# Patient Record
Sex: Male | Born: 1935 | Race: White | Hispanic: No | Marital: Married | Smoking: Never smoker
Health system: Southern US, Community
[De-identification: ages and names within clinical notes are randomized; demographics above are authoritative.]

## PROBLEM LIST (undated history)

## (undated) DIAGNOSIS — E0500 Thyrotoxicosis with diffuse goiter without thyrotoxic crisis or storm: Secondary | ICD-10-CM

## (undated) DIAGNOSIS — C61 Malignant neoplasm of prostate: Secondary | ICD-10-CM

## (undated) DIAGNOSIS — I10 Essential (primary) hypertension: Secondary | ICD-10-CM

## (undated) DIAGNOSIS — C801 Malignant (primary) neoplasm, unspecified: Secondary | ICD-10-CM

## (undated) DIAGNOSIS — I4891 Unspecified atrial fibrillation: Secondary | ICD-10-CM

## (undated) DIAGNOSIS — I495 Sick sinus syndrome: Secondary | ICD-10-CM

## (undated) DIAGNOSIS — E785 Hyperlipidemia, unspecified: Secondary | ICD-10-CM

## (undated) DIAGNOSIS — N289 Disorder of kidney and ureter, unspecified: Secondary | ICD-10-CM

## (undated) HISTORY — DX: Malignant neoplasm of prostate: C61

## (undated) HISTORY — DX: Disorder of kidney and ureter, unspecified: N28.9

## (undated) HISTORY — DX: Essential (primary) hypertension: I10

## (undated) HISTORY — PX: PACEMAKER INSERTION: SHX728

## (undated) HISTORY — DX: Thyrotoxicosis with diffuse goiter without thyrotoxic crisis or storm: E05.00

## (undated) HISTORY — DX: Hyperlipidemia, unspecified: E78.5

## (undated) HISTORY — DX: Unspecified atrial fibrillation: I48.91

## (undated) HISTORY — DX: Sick sinus syndrome: I49.5

## (undated) HISTORY — DX: Malignant (primary) neoplasm, unspecified: C80.1

---
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
## 2004-05-05 LAB — HM COLONOSCOPY: HM Colonoscopy: NORMAL

## 2004-06-26 ENCOUNTER — Encounter: Payer: Self-pay | Admitting: Internal Medicine

## 2004-11-25 ENCOUNTER — Encounter: Payer: Self-pay | Admitting: Internal Medicine

## 2005-05-05 HISTORY — PX: OTHER SURGICAL HISTORY: SHX169

## 2005-05-19 ENCOUNTER — Encounter: Payer: Self-pay | Admitting: Internal Medicine

## 2007-01-06 ENCOUNTER — Ambulatory Visit: Payer: Self-pay | Admitting: Internal Medicine

## 2007-01-06 DIAGNOSIS — I10 Essential (primary) hypertension: Secondary | ICD-10-CM | POA: Insufficient documentation

## 2007-01-06 DIAGNOSIS — E0500 Thyrotoxicosis with diffuse goiter without thyrotoxic crisis or storm: Secondary | ICD-10-CM

## 2007-01-06 DIAGNOSIS — J309 Allergic rhinitis, unspecified: Secondary | ICD-10-CM | POA: Insufficient documentation

## 2007-01-06 DIAGNOSIS — E785 Hyperlipidemia, unspecified: Secondary | ICD-10-CM | POA: Insufficient documentation

## 2007-01-08 LAB — CONVERTED CEMR LAB
ALT: 27 units/L (ref 0–53)
Albumin: 4 g/dL (ref 3.5–5.2)
BUN: 20 mg/dL (ref 6–23)
Basophils Relative: 0.4 % (ref 0.0–1.0)
CO2: 32 meq/L (ref 19–32)
Calcium: 9.5 mg/dL (ref 8.4–10.5)
Chloride: 104 meq/L (ref 96–112)
Creatinine, Ser: 1.5 mg/dL (ref 0.4–1.5)
Eosinophils Relative: 1.4 % (ref 0.0–5.0)
Free T4: 0.9 ng/dL (ref 0.6–1.6)
GFR calc non Af Amer: 49 mL/min
LDL Cholesterol: 98 mg/dL (ref 0–99)
Lymphocytes Relative: 21.3 % (ref 12.0–46.0)
Platelets: 218 10*3/uL (ref 150–400)
RBC: 4.08 M/uL — ABNORMAL LOW (ref 4.22–5.81)
RDW: 12 % (ref 11.5–14.6)
TSH: 2.15 microintl units/mL (ref 0.35–5.50)
Total CHOL/HDL Ratio: 4.8
Triglycerides: 156 mg/dL — ABNORMAL HIGH (ref 0–149)
VLDL: 31 mg/dL (ref 0–40)
WBC: 5.4 10*3/uL (ref 4.5–10.5)

## 2007-01-12 ENCOUNTER — Ambulatory Visit: Payer: Self-pay | Admitting: Internal Medicine

## 2007-01-14 ENCOUNTER — Encounter: Payer: Self-pay | Admitting: Internal Medicine

## 2007-01-22 ENCOUNTER — Ambulatory Visit: Payer: Self-pay

## 2007-02-10 ENCOUNTER — Encounter: Payer: Self-pay | Admitting: Internal Medicine

## 2007-06-09 ENCOUNTER — Telehealth (INDEPENDENT_AMBULATORY_CARE_PROVIDER_SITE_OTHER): Payer: Self-pay | Admitting: *Deleted

## 2007-06-17 ENCOUNTER — Ambulatory Visit: Payer: Self-pay | Admitting: Internal Medicine

## 2007-07-14 ENCOUNTER — Ambulatory Visit: Payer: Self-pay | Admitting: Internal Medicine

## 2007-07-14 LAB — CONVERTED CEMR LAB
ALT: 24 units/L (ref 0–53)
AST: 22 units/L (ref 0–37)
BUN: 17 mg/dL (ref 6–23)
Basophils Absolute: 0 10*3/uL (ref 0.0–0.1)
Bilirubin, Direct: 0.1 mg/dL (ref 0.0–0.3)
Cholesterol: 134 mg/dL (ref 0–200)
Creatinine, Ser: 1.6 mg/dL — ABNORMAL HIGH (ref 0.4–1.5)
Eosinophils Relative: 1.4 % (ref 0.0–5.0)
Free T4: 0.8 ng/dL (ref 0.6–1.6)
GFR calc Af Amer: 55 mL/min
GFR calc non Af Amer: 46 mL/min
HCT: 39.2 % (ref 39.0–52.0)
Hemoglobin: 13.4 g/dL (ref 13.0–17.0)
LDL Cholesterol: 77 mg/dL (ref 0–99)
MCHC: 34.1 g/dL (ref 30.0–36.0)
MCV: 96.7 fL (ref 78.0–100.0)
Monocytes Absolute: 0.7 10*3/uL (ref 0.2–0.7)
Neutrophils Relative %: 60.6 % (ref 43.0–77.0)
Phosphorus: 3.2 mg/dL (ref 2.3–4.6)
Potassium: 4.3 meq/L (ref 3.5–5.1)
RBC: 4.06 M/uL — ABNORMAL LOW (ref 4.22–5.81)
RDW: 12 % (ref 11.5–14.6)
Sodium: 137 meq/L (ref 135–145)
Total Protein: 7.1 g/dL (ref 6.0–8.3)
WBC: 5.2 10*3/uL (ref 4.5–10.5)

## 2007-09-06 ENCOUNTER — Ambulatory Visit: Payer: Self-pay

## 2008-01-11 ENCOUNTER — Ambulatory Visit: Payer: Self-pay | Admitting: Internal Medicine

## 2008-01-11 DIAGNOSIS — Z8546 Personal history of malignant neoplasm of prostate: Secondary | ICD-10-CM | POA: Insufficient documentation

## 2008-01-12 LAB — CONVERTED CEMR LAB
Alkaline Phosphatase: 76 units/L (ref 39–117)
BUN: 28 mg/dL — ABNORMAL HIGH (ref 6–23)
Bilirubin, Direct: 0.1 mg/dL (ref 0.0–0.3)
CO2: 28 meq/L (ref 19–32)
Chloride: 103 meq/L (ref 96–112)
Creatinine, Ser: 1.7 mg/dL — ABNORMAL HIGH (ref 0.4–1.5)
GFR calc Af Amer: 51 mL/min
Glucose, Bld: 95 mg/dL (ref 70–99)
LDL Cholesterol: 85 mg/dL (ref 0–99)
Potassium: 4.8 meq/L (ref 3.5–5.1)
Total Bilirubin: 0.9 mg/dL (ref 0.3–1.2)
Total CHOL/HDL Ratio: 4.5
Total Protein: 7 g/dL (ref 6.0–8.3)
VLDL: 20 mg/dL (ref 0–40)

## 2008-04-17 ENCOUNTER — Ambulatory Visit: Payer: Self-pay | Admitting: Internal Medicine

## 2008-07-17 ENCOUNTER — Ambulatory Visit: Payer: Self-pay | Admitting: Internal Medicine

## 2008-07-19 ENCOUNTER — Ambulatory Visit: Payer: Self-pay | Admitting: Internal Medicine

## 2008-07-19 DIAGNOSIS — N1832 Chronic kidney disease, stage 3b: Secondary | ICD-10-CM | POA: Insufficient documentation

## 2008-07-19 DIAGNOSIS — N183 Chronic kidney disease, stage 3 (moderate): Secondary | ICD-10-CM

## 2008-07-20 LAB — CONVERTED CEMR LAB
AST: 23 units/L (ref 0–37)
Albumin: 4.2 g/dL (ref 3.5–5.2)
Alkaline Phosphatase: 87 units/L (ref 39–117)
Basophils Relative: 0.6 % (ref 0.0–3.0)
Cholesterol: 137 mg/dL (ref 0–200)
Creatinine, Ser: 1.7 mg/dL — ABNORMAL HIGH (ref 0.4–1.5)
Eosinophils Absolute: 0.1 10*3/uL (ref 0.0–0.7)
HDL: 34.4 mg/dL — ABNORMAL LOW (ref >39.00)
Hemoglobin: 13.7 g/dL (ref 13.0–17.0)
LDL Cholesterol: 83 mg/dL (ref 0–99)
MCHC: 34.4 g/dL (ref 30.0–36.0)
MCV: 96.7 fL (ref 78.0–100.0)
Monocytes Absolute: 0.8 10*3/uL (ref 0.1–1.0)
Neutro Abs: 3.6 10*3/uL (ref 1.4–7.7)
Potassium: 5.1 meq/L (ref 3.5–5.1)
RBC: 4.11 M/uL — ABNORMAL LOW (ref 4.22–5.81)
RDW: 12 % (ref 11.5–14.6)
Total Protein: 7.2 g/dL (ref 6.0–8.3)
Triglycerides: 100 mg/dL (ref 0.0–149.0)

## 2008-08-25 ENCOUNTER — Encounter (INDEPENDENT_AMBULATORY_CARE_PROVIDER_SITE_OTHER): Payer: Self-pay | Admitting: Radiology

## 2009-01-17 ENCOUNTER — Ambulatory Visit: Payer: Self-pay | Admitting: Internal Medicine

## 2009-01-19 LAB — CONVERTED CEMR LAB
Albumin: 4.3 g/dL (ref 3.5–5.2)
Basophils Relative: 0 % (ref 0.0–3.0)
CO2: 30 meq/L (ref 19–32)
Calcium: 9.3 mg/dL (ref 8.4–10.5)
Eosinophils Relative: 1.3 % (ref 0.0–5.0)
Glucose, Bld: 96 mg/dL (ref 70–99)
HCT: 41.7 % (ref 39.0–52.0)
HDL: 31.7 mg/dL — ABNORMAL LOW (ref >39.00)
Hemoglobin: 14.2 g/dL (ref 13.0–17.0)
LDL Cholesterol: 81 mg/dL (ref 0–99)
Lymphs Abs: 1.4 10*3/uL (ref 0.7–4.0)
Monocytes Relative: 13.6 % — ABNORMAL HIGH (ref 3.0–12.0)
Neutro Abs: 3.5 10*3/uL (ref 1.4–7.7)
Platelets: 205 10*3/uL (ref 150.0–400.0)
RBC: 4.25 M/uL (ref 4.22–5.81)
Sodium: 141 meq/L (ref 135–145)
Total CHOL/HDL Ratio: 4
Triglycerides: 116 mg/dL (ref 0.0–149.0)
VLDL: 23.2 mg/dL (ref 0.0–40.0)
WBC: 5.8 10*3/uL (ref 4.5–10.5)

## 2009-02-21 DIAGNOSIS — R55 Syncope and collapse: Secondary | ICD-10-CM | POA: Insufficient documentation

## 2009-04-04 ENCOUNTER — Encounter (INDEPENDENT_AMBULATORY_CARE_PROVIDER_SITE_OTHER): Payer: Self-pay | Admitting: *Deleted

## 2009-05-07 ENCOUNTER — Ambulatory Visit: Payer: Self-pay | Admitting: Internal Medicine

## 2009-07-18 ENCOUNTER — Ambulatory Visit: Payer: Self-pay | Admitting: Internal Medicine

## 2009-07-20 LAB — CONVERTED CEMR LAB
Albumin: 4 g/dL (ref 3.5–5.2)
Calcium: 9.3 mg/dL (ref 8.4–10.5)
Glucose, Bld: 98 mg/dL (ref 70–99)
Potassium: 4.9 meq/L (ref 3.5–5.1)

## 2010-01-15 ENCOUNTER — Ambulatory Visit: Payer: Self-pay | Admitting: Internal Medicine

## 2010-01-16 LAB — CONVERTED CEMR LAB
Albumin: 4.3 g/dL (ref 3.5–5.2)
Basophils Relative: 0.5 % (ref 0.0–3.0)
CO2: 31 meq/L (ref 19–32)
Chloride: 100 meq/L (ref 96–112)
Eosinophils Relative: 0.8 % (ref 0.0–5.0)
Free T4: 0.93 ng/dL (ref 0.60–1.60)
HCT: 39.8 % (ref 39.0–52.0)
HDL: 35.3 mg/dL — ABNORMAL LOW (ref >39.00)
LDL Cholesterol: 104 mg/dL — ABNORMAL HIGH (ref 0–99)
Lymphs Abs: 1.3 10*3/uL (ref 0.7–4.0)
MCV: 98.3 fL (ref 78.0–100.0)
Monocytes Absolute: 0.9 10*3/uL (ref 0.1–1.0)
Potassium: 4.9 meq/L (ref 3.5–5.1)
RBC: 4.04 M/uL — ABNORMAL LOW (ref 4.22–5.81)
Sodium: 140 meq/L (ref 135–145)
Total CHOL/HDL Ratio: 4
Triglycerides: 83 mg/dL (ref 0.0–149.0)
WBC: 6.7 10*3/uL (ref 4.5–10.5)

## 2010-05-05 HISTORY — PX: CATARACT EXTRACTION W/ INTRAOCULAR LENS  IMPLANT, BILATERAL: SHX1307

## 2010-05-13 ENCOUNTER — Telehealth (INDEPENDENT_AMBULATORY_CARE_PROVIDER_SITE_OTHER): Payer: Self-pay | Admitting: *Deleted

## 2010-06-04 NOTE — Assessment & Plan Note (Signed)
Summary: 6 MONTH FOLLOW UPR/BH   Vital Signs:  Patient profile:   76 year old male Weight:      157 pounds Temp:     98.3 degrees F oral Pulse rate:   64 / minute Pulse rhythm:   regular BP sitting:   120 / 70  (left arm) Cuff size:   large  Vitals Entered By: Mervin Hack CMA Duncan Dull) (July 18, 2009 8:57 AM) CC: 6 month follow-up   History of Present Illness: Feels fine No new complaints  did have some pedal edema at last visit with Dr Graciela Husbands he hasn't noticed any swelling, unless he is travelling (with prolonged sitting) NO SOB  [facility] no [facility] in [facility] tolerance  No chest pain No palpitations  No voiding problems nocturia x 1 (not every night)  Allergies: No Known Drug Allergies  Past History:  Past medical, surgical, family and social histories (including risk factors) reviewed for relevance to current acute and chronic problems.  Past Medical History: RENAL INSUFFICIENCY (ICD-588.9) ADENOCARCINOMA, PROSTATE (ICD-185) GRAVE'S DISEASE (ICD-242.00) HYPERTENSION (ICD-401.9) HYPERLIPIDEMIA (ICD-272.4) ALLERGIC RHINITIS (ICD-477.9) SICK SINUS SYNDROME (ICD-427.81)-----------------------------------------------Dr Graciela Husbands  Past Surgical History: Reviewed history from 01/06/2007 and no changes required. Tonsils/adenoids  1947 Prostate cancer --RT  2007 Pacemaker--8/06  bradycardia/syncope  Family History: Reviewed history from 01/06/2007 and no changes required. Dad died in 28's  Blood clot during CABG Mom died in 41's     Some cancer, had DM 1 sister--good health CAD only in Dad DM just Mom No HTN No colon or prostate cancer  Social History: Reviewed history from 01/06/2007 and no changes required. 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 Wife to make health care decisions (then daughter Marjean Donna) Would accept resuscitation No  sure about feeding tube but no "unusual attempts"  Review of Systems       no arthritis pain appetite is fine weight fairly stable sleeps well  Physical Exam  General:  alert and normal appearance.   Neck:  supple, no masses, no thyromegaly, no carotid bruits, and no cervical lymphadenopathy.   Lungs:  normal respiratory effort and normal breath sounds.   Heart:  normal rate, regular rhythm, no murmur, and no gallop.   Abdomen:  soft, non-tender, no masses, no hepatomegaly, and no splenomegaly.   Pulses:  2+ in feet Extremities:  no edema today Psych:  normally interactive, good eye contact, not anxious appearing, and not depressed appearing.     Impression & Recommendations:  Problem # 1:  HYPERTENSION (ICD-401.9) Assessment Unchanged good control could change to losartan if insurance problems  His updated medication list for this problem includes:    Diovan Hct 160-12.5 Mg Tabs (Valsartan-hydrochlorothiazide) .Marland Kitchen... Take 1 tablet by mouth once a day    Bisoprolol Fumarate 5 Mg Tabs (Bisoprolol fumarate) .Marland Kitchen... Take 1/2  by mouth daily  BP today: 120/70 Prior BP: 120/68 (05/07/2009)  Prior 10 Yr Risk Heart Disease: 14 % (01/17/2009)  Labs Reviewed: K+: 5.1 (01/17/2009) Creat: : 1.5 (01/17/2009)   Chol: 136 (01/17/2009)   HDL: 31.70 (01/17/2009)   LDL: 81 (01/17/2009)   TG: 116.0 (01/17/2009)  Problem # 2:  HYPERLIPIDEMIA (ICD-272.4) Assessment: Unchanged at goal no problems with med labs next time  His updated medication list for this problem includes:    Lipitor 10 Mg Tabs (Atorvastatin calcium) .Marland Kitchen... Take 1 tablet by mouth once a day  Labs Reviewed: SGOT: 22 (01/17/2009)  SGPT: 22 (01/17/2009)  Prior 10 Yr Risk Heart Disease: 14 % (01/17/2009)   HDL:31.70 (01/17/2009), 34.40 (07/19/2008)  LDL:81 (01/17/2009), 83 (07/19/2008)  Chol:136 (01/17/2009), 137 (07/19/2008)  Trig:116.0 (01/17/2009), 100.0 (07/19/2008)  Problem # 3:  RENAL INSUFFICIENCY  (ICD-588.9) Assessment: Comment Only  last creat 1.5 has been stable will recheck  Orders: TLB-Renal Function Panel (80069-RENAL) Venipuncture (21308)  Problem # 4:  ADENOCARCINOMA, PROSTATE (ICD-185) Assessment: Comment Only  due for PSA  Orders: TLB-PSA (Prostate Specific Antigen) (84153-PSA)  Problem # 5:  SICK SINUS SYNDROME (ICD-427.81) Assessment: Comment Only pacer Dr Graciela Husbands follows  His updated medication list for this problem includes:    Bisoprolol Fumarate 5 Mg Tabs (Bisoprolol fumarate) .Marland Kitchen... Take 1/2  by mouth daily    Aspirin 81 Mg Tbec (Aspirin) .Marland Kitchen... Take one by mouth daily  Complete Medication List: 1)  Lipitor 10 Mg Tabs (Atorvastatin calcium) .... Take 1 tablet by mouth once a day 2)  Diovan Hct 160-12.5 Mg Tabs (Valsartan-hydrochlorothiazide) .... Take 1 tablet by mouth once a day 3)  Centrum Silver Tabs (Multiple vitamins-minerals) .... Take 1 tablet by mouth once a day 4)  Bisoprolol Fumarate 5 Mg Tabs (Bisoprolol fumarate) .... Take 1/2  by mouth daily 5)  Aspirin 81 Mg Tbec (Aspirin) .... Take one by mouth daily 6)  Loratadine 10 Mg Tabs (Loratadine) .... Take 1 by mouth once daily 7)  Vitamin D 1000 Unit Tabs (Cholecalciferol) .... Take 1 by mouth once daily  Patient Instructions: 1)  Please schedule a follow-up appointment in 6 months .  Prescriptions: BISOPROLOL FUMARATE 5 MG  TABS (BISOPROLOL FUMARATE) Take 1/2  by mouth daily  #45 x 3   Entered by:   Mervin Hack CMA (AAMA)   Authorized by:   Cindee Salt MD   Signed by:   Mervin Hack CMA (AAMA) on 07/18/2009   Method used:   Electronically to        [id number]       [address]       [place], [place]  [address]       Ph: [phone]       Fax: [phone]   RxID:   [id number] DIOVAN HCT 160-12.5 MG  TABS (VALSARTAN-HYDROCHLOROTHIAZIDE) Take 1 tablet by mouth once a day  #90 x 3   Entered by:   Mervin Hack CMA (AAMA)   Authorized by:    Cindee Salt MD   Signed by:   Mervin Hack CMA (AAMA) on 07/18/2009   Method used:   Electronically to        [id number] ([facility]       [address]       [place], [place]  [address]       Ph: [phone]       Fax: [phone]   RxID:   [id number] LIPITOR 10 MG  TABS (ATORVASTATIN CALCIUM) Take 1 tablet by mouth once a day  #90 x 3   Entered by:   Mervin Hack CMA (AAMA)   Authorized by:   Cindee Salt MD   Signed by:   Mervin Hack CMA (AAMA) on 07/18/2009   Method used:   Electronically to        [id number] ([facility]       [address]       [place], [place]  [address]       Ph: [phone]  Fax: [phone]   RxID:   [id number]   Current Allergies (reviewed today): No known allergies   Appended Document: Orders Update    Clinical Lists Changes  Orders: Added new Service order of Prescription Created Electronically [phone]) - Signed

## 2010-06-04 NOTE — Assessment & Plan Note (Signed)
Summary: F1Y/AMD   Visit Type:  Follow-up Primary Provider:  Cindee Salt MD  CC:  no complaints.  History of Present Illness: [name] is seen in followup for syncope and history of sinus node dysfunction for which he underwent Biotronik pacemaker implantation a number of years ago in [place].The patient denies SOB, chest pain, edema or palpitations   Current Problems (verified): 1)  Syncope and Collapse  (ICD-780.2) 2)  Renal Insufficiency  (ICD-588.9) 3)  Prostate Cancer, Hx of  (ICD-V10.46) 4)  Adenocarcinoma, Prostate  (ICD-185) 5)  Grave's Disease  (ICD-242.00) 6)  Hypertension  (ICD-401.9) 7)  Hyperlipidemia  (ICD-272.4) 8)  Allergic Rhinitis  (ICD-477.9) 9)  Sick Sinus Syndrome  (ICD-427.81)  Current Medications (verified): 1)  Lipitor 10 Mg  Tabs (Atorvastatin Calcium) .... Take 1 Tablet By Mouth Once A Day 2)  Diovan Hct 160-12.5 Mg  Tabs (Valsartan-Hydrochlorothiazide) .... Take 1 Tablet By Mouth Once A Day 3)  Centrum Silver   Tabs (Multiple Vitamins-Minerals) .... Take 1 Tablet By Mouth Once A Day 4)  Bisoprolol Fumarate 5 Mg  Tabs (Bisoprolol Fumarate) .... Take 1/2  By Mouth Daily 5)  Aspirin 81 Mg Tbec (Aspirin) .... Take One By Mouth Daily 6)  Loratadine 10 Mg Tabs (Loratadine) .... Take 1 By Mouth Once Daily 7)  Vitamin D 1000 Unit Tabs (Cholecalciferol) .... Take 1 By Mouth Once Daily  Allergies (verified): No Known Drug Allergies  Vital Signs:  Patient profile:   76 year old male Height:      64 inches Weight:      160.25 pounds BMI:     27.61 Pulse rate:   60 / minute Pulse rhythm:   regular BP sitting:   120 / 68  (left arm) Cuff size:   regular  Vitals Entered By: Mercer Pod (May 07, 2009 11:55 AM)  Physical Exam  General:  The patient was alert and oriented in no acute distress. HEENT Normal.  Neck veins were flat, carotids were brisk.  Lungs were clear.  Heart sounds were regular without murmurs or gallops.    Abdomen was soft with active bowel sounds. There is no clubbing cyanosis or edema. Skin Warm and dry    PPM Specifications Following MD:  Sherryl Manges, MD     PPM Vendor:  Biotronik     PPM Model Number:  Scarlette Shorts     [facility] Serial Number:  [id number] PPM DOI:  12/03/2004     PPM Implanting MD:  Sherryl Manges, MD  Lead 1    Location: RA     DOI: 12/03/2004     Model #: Victorio Palm     Serial #: [id number]     Status: active Lead 2    Location: RV     DOI: 12/03/2004     Model #: SELX ST     Serial #: [id number]     Status: active   Indications:  SICK SINUS SYNDROME   PPM Follow Up Remote Check?  No Battery Voltage:  2.77 V     Battery Est. Longevity:  3 years 8 months     Pacer Dependent:  No       PPM Device Measurements Atrium  Amplitude: 5.8 mV, Impedance: 510 ohms, Threshold: 0.8 V at 0.4 msec Right Ventricle  Amplitude: 17.9 mV, Impedance: 982 ohms, Threshold: 0.3 V at 0.4 msec Auto Capture ER/Polarity: bipolar  Episodes MS Episodes:  21     Percent Mode Switch:  0  Ventricular High Rate:  1     Atrial Pacing:  97     Ventricular Pacing:  3  Parameters Mode:  dddr     Lower Rate Limit:  60     Upper Rate Limit:  130 Paced AV Delay:  300      Sensed AV Delay:  300 Rate Response Parameters:  125 Tech Comments:  longest MS was 1.5 minutes and fastest was 192.  Derek Jack, Cala Bradford :)  Impression & Recommendations:  Problem # 1:  SYNCOPE AND COLLAPSE (ICD-780.2) No recurrences following pacemaker implantation His updated medication list for this problem includes:    Bisoprolol Fumarate 5 Mg Tabs (Bisoprolol fumarate) .Marland Kitchen... Take 1/2  by mouth daily    Aspirin 81 Mg Tbec (Aspirin) .Marland Kitchen... Take one by mouth daily  Problem # 2:  SICK SINUS SYNDROME (ICD-427.81) !00 % atrial pacing wtih modest chronotropic incompetence We have reprogrammed his rate response slope to augment activity.  There are also episodes of atrial fibrillation identified by egram the longest of which is 1 1/2  minutes.  We will continue to monigtor this closesly His updated medication list for this problem includes:    Bisoprolol Fumarate 5 Mg Tabs (Bisoprolol fumarate) .Marland Kitchen... Take 1/2  by mouth daily    Aspirin 81 Mg Tbec (Aspirin) .Marland Kitchen... Take one by mouth daily  Problem # 3:  PACEMAKER, PERMANENT  DDD BIOTRONIK (ICD-V45.01) Device was interrogated and reprogramed as noted.  He undertakes daily monitoring  Patient Instructions: 1)  Your physician recommends that you schedule a follow-up appointment in: 1 year 2)  Your physician recommends that you continue on your current medications as directed. Please refer to the Current Medication list given to you today.

## 2010-06-04 NOTE — Assessment & Plan Note (Signed)
Summary: ROA 6 MTHS CYD   Vital Signs:  Patient profile:   76 year old male Weight:      161 pounds Temp:     97.8 degrees F oral Pulse rate:   60 / minute Pulse rhythm:   regular BP sitting:   132 / 68  (left arm) Cuff size:   large  Vitals Entered By: Mervin Hack CMA Duncan Dull) (January 15, 2010 10:32 AM) CC: 6 month follow-up   History of Present Illness: Doing great just home from 76 day tour of [place]  Did notice edema with trip but resolved quickly seems to be back to normal now No chest pain  No SOB No palpitations  [facility]  No problems with chol med no myalgias no sig arthritic problems  voids okay Nocturia x 1 in general  Allergies: No Known Drug Allergies  Past History:  Past medical, surgical, family and social histories (including risk factors) reviewed for relevance to current acute and chronic problems.  Past Medical History: Reviewed history from 07/18/2009 and no changes required. RENAL INSUFFICIENCY (ICD-588.9) ADENOCARCINOMA, PROSTATE (ICD-185) GRAVE'S DISEASE (ICD-242.00) HYPERTENSION (ICD-401.9) HYPERLIPIDEMIA (ICD-272.4) ALLERGIC RHINITIS (ICD-477.9) SICK SINUS SYNDROME (ICD-427.81)-----------------------------------------------Dr Graciela Husbands  Past Surgical History: Reviewed history from 01/06/2007 and no changes required. Tonsils/adenoids  1947 Prostate cancer --RT  2007 Pacemaker--8/06  bradycardia/syncope  Family History: Reviewed history from 01/06/2007 and no changes required. Dad died in 69's  Blood clot during CABG Mom died in 63's     Some cancer, had DM 1 sister--good health CAD only in Dad DM just Mom No HTN No colon or prostate cancer  Social History: Reviewed history from 01/06/2007 and no changes required. 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 Wife to make health care decisions (then  daughter Marjean Donna) Would accept resuscitation No sure about feeding tube but no "unusual attempts"  Review of Systems       weight up 4# but just back from [place] trip Sleeps well   Physical Exam  General:  alert and normal appearance.   Neck:  supple, no masses, no thyromegaly, no carotid bruits, and no cervical lymphadenopathy.   Lungs:  normal respiratory effort, no intercostal retractions, no accessory muscle use, and normal breath sounds.   Heart:  normal rate, regular rhythm, no murmur, and no gallop.   Abdomen:  soft and non-tender.   Msk:  no joint tenderness and no joint swelling.   Pulses:  1+ in feet Extremities:  no edema or calf tenderness Psych:  normally interactive, good eye contact, not anxious appearing, and not depressed appearing.     Impression & Recommendations:  Problem # 1:  HYPERTENSION (ICD-401.9) Assessment Unchanged  good control no changes needed will recheck renal function  His updated medication list for this problem includes:    Diovan Hct 160-12.5 Mg Tabs (Valsartan-hydrochlorothiazide) .Marland Kitchen... Take 1 tablet by mouth once a day    Bisoprolol Fumarate 5 Mg Tabs (Bisoprolol fumarate) .Marland Kitchen... Take 1/2  by mouth daily  BP today: 132/68 Prior BP: 120/70 (07/18/2009)  Prior 10 Yr Risk Heart Disease: 14 % (01/17/2009)  Labs Reviewed: K+: 4.9 (07/18/2009) Creat: : 1.8 (07/18/2009)   Chol: 136 (01/17/2009)   HDL: 31.70 (01/17/2009)   LDL: 81 (01/17/2009)   TG: 116.0 (01/17/2009)  Orders: TLB-Renal Function Panel (80069-RENAL) TLB-CBC Platelet - w/Differential (85025-CBCD) Venipuncture (87564)  Problem # 2:  HYPERLIPIDEMIA (ICD-272.4) Assessment: Unchanged  no problems with  meds due for labs  His updated medication list for this problem includes:    Lipitor 10 Mg Tabs (Atorvastatin calcium) .Marland Kitchen... Take 1 tablet by mouth once a day  Labs Reviewed: SGOT: 22 (01/17/2009)   SGPT: 22 (01/17/2009)  Prior 10 Yr Risk Heart Disease: 14 %  (01/17/2009)   HDL:31.70 (01/17/2009), 34.40 (07/19/2008)  LDL:81 (01/17/2009), 83 (07/19/2008)  Chol:136 (01/17/2009), 137 (07/19/2008)  Trig:116.0 (01/17/2009), 100.0 (07/19/2008)  Orders: TLB-Lipid Panel (80061-LIPID) TLB-Hepatic/Liver Function Pnl (80076-HEPATIC)  Problem # 3:  RENAL INSUFFICIENCY (ICD-588.9) Assessment: Unchanged no symptoms will recheck last 1.8  Problem # 4:  SICK SINUS SYNDROME (ICD-427.81) Assessment: Unchanged pacer in  His updated medication list for this problem includes:    Bisoprolol Fumarate 5 Mg Tabs (Bisoprolol fumarate) .Marland Kitchen... Take 1/2  by mouth daily    Aspirin 81 Mg Tbec (Aspirin) .Marland Kitchen... Take one by mouth daily  Labs Reviewed: Na: 144 (07/18/2009)   K+: 4.9 (07/18/2009)   CL: 109 (07/18/2009)   HCO3: 31 (07/18/2009) Ca: 9.3 (07/18/2009)   TSH: 1.83 (07/19/2008)   HCO3: 31 (07/18/2009)  Problem # 5:  ADENOCARCINOMA, PROSTATE (ICD-185) Assessment: Unchanged  will continue PSA every 6 months for now  Orders: TLB-PSA (Prostate Specific Antigen) (84153-PSA)  Complete Medication List: 1)  Lipitor 10 Mg Tabs (Atorvastatin calcium) .... Take 1 tablet by mouth once a day 2)  Diovan Hct 160-12.5 Mg Tabs (Valsartan-hydrochlorothiazide) .... Take 1 tablet by mouth once a day 3)  Bisoprolol Fumarate 5 Mg Tabs (Bisoprolol fumarate) .... Take 1/2  by mouth daily 4)  Aspirin 81 Mg Tbec (Aspirin) .... Take one by mouth daily 5)  Loratadine 10 Mg Tabs (Loratadine) .... Take 1 by mouth once daily 6)  Vitamin D 1000 Unit Tabs (Cholecalciferol) .... Take 1 by mouth once daily 7)  Centrum Silver Tabs (Multiple vitamins-minerals) .... Take 1 tablet by mouth once a day  Other Orders: TLB-TSH (Thyroid Stimulating Hormone) (84443-TSH) TLB-T4 (Thyrox), Free [phone])  Patient Instructions: 1)  Please schedule a follow-up appointment in 6 months .   Current Allergies (reviewed today): No known allergies

## 2010-06-06 NOTE — Progress Notes (Signed)
Summary: Called pt  Phone Note Outgoing Call Call back at [facility] Phone [phone]   Call placed by: Harlon Flor,  May 13, 2010 9:42 AM Call placed to: Patient Summary of Call: LMOM TCB to reschedule BUMP appt from 2/2 Initial call taken by: Harlon Flor,  May 13, 2010 9:42 AM

## 2010-06-11 ENCOUNTER — Encounter (INDEPENDENT_AMBULATORY_CARE_PROVIDER_SITE_OTHER): Payer: Medicare Other | Admitting: Internal Medicine

## 2010-06-11 ENCOUNTER — Encounter: Payer: Self-pay | Admitting: Internal Medicine

## 2010-06-11 DIAGNOSIS — Z950 Presence of cardiac pacemaker: Secondary | ICD-10-CM

## 2010-06-11 DIAGNOSIS — I498 Other specified cardiac arrhythmias: Secondary | ICD-10-CM

## 2010-06-20 NOTE — Assessment & Plan Note (Signed)
Summary: pacemaker check/sab f1y/mj   Visit Type:  Follow-up Primary Provider:  Cindee Salt MD  CC:  "Doing well"..  History of Present Illness: Jason Lambert is seen in followup for syncope and history of sinus node dysfunction for which he underwent Biotronik pacemaker implantation a number of years ago in [place].The patient denies SOB, chest pain, edema or palpitations   Current Medications (verified): 1)  Lipitor 10 Mg  Tabs (Atorvastatin Calcium) .... Take 1 Tablet By Mouth Once A Day 2)  Diovan Hct 160-12.5 Mg  Tabs (Valsartan-Hydrochlorothiazide) .... Take 1 Tablet By Mouth Once A Day 3)  Bisoprolol Fumarate 5 Mg  Tabs (Bisoprolol Fumarate) .... Take 1/2  By Mouth Daily 4)  Aspirin 81 Mg Tbec (Aspirin) .... Take One By Mouth Daily 5)  Loratadine 10 Mg Tabs (Loratadine) .... Take 1 By Mouth Once Daily 6)  Vitamin D 1000 Unit Tabs (Cholecalciferol) .... Take 1 By Mouth Once Daily 7)  Centrum Silver   Tabs (Multiple Vitamins-Minerals) .... Take 1 Tablet By Mouth Once A Day  Allergies (verified): No Known Drug Allergies  Past History:  Past Medical History: Last updated: 07/18/2009 RENAL INSUFFICIENCY (ICD-588.9) ADENOCARCINOMA, PROSTATE (ICD-185) GRAVE'S DISEASE (ICD-242.00) HYPERTENSION (ICD-401.9) HYPERLIPIDEMIA (ICD-272.4) ALLERGIC RHINITIS (ICD-477.9) SICK SINUS SYNDROME (ICD-427.81)-----------------------------------------------Dr Graciela Husbands  Past Surgical History: Last updated: 01/21/2007 Tonsils/adenoids  1947 Prostate cancer --RT  2007 Pacemaker--8/06  bradycardia/syncope  Family History: Last updated: 01/21/2007 Dad died in 80's  Blood clot during CABG Mom died in 24's     Some cancer, had DM 1 sister--good health CAD only in Dad DM just Mom No HTN No colon or prostate cancer  Social History: Last updated: 01-21-07 Retired--electrical engineer with [employer] Married--2 daughters Never Smoked Alcohol use-occ Walking fairly regularly  Has  living will. Wife to make health care decisions (then daughter Jason Lambert) Would accept resuscitation No sure about feeding tube but no "unusual attempts"  Risk Factors: Smoking Status: never (01/21/2007)  Vital Signs:  Patient profile:   76 year old male Height:      64 inches Weight:      162 pounds BMI:     27.91 Pulse rate:   60 / minute BP sitting:   142 / 76  (left arm) Cuff size:   regular  Vitals Entered By: Bishop Dublin, CMA (June 11, 2010 10:36 AM)  Physical Exam  General:  The patient was alert and oriented in no acute distress. HEENT Normal.  Neck veins were flat, carotids were brisk.  Lungs were clear.  Heart sounds were regular without murmurs or gallops.  Abdomen was soft with active bowel sounds. There is no clubbing cyanosis or edema. Skin Warm and dry    PPM Specifications Following MD:  Sherryl Manges, MD     PPM Vendor:  Biotronik     PPM Model Number:  Scarlette Shorts     [facility] Serial Number:  [id number] PPM DOI:  12/03/2004     PPM Implanting MD:  Sherryl Manges, MD  Lead 1    Location: RA     DOI: 12/03/2004     Model #: Victorio Palm     Serial #: [id number]     Status: active Lead 2    Location: RV     DOI: 12/03/2004     Model #: SELX ST     Serial #: [id number]     Status: active   Indications:  SICK SINUS SYNDROME   PPM Follow Up Pacer Dependent:  No  Parameters Mode:  dddr     Lower Rate Limit:  60     Upper Rate Limit:  130 Paced AV Delay:  300      Sensed AV Delay:  300 Rate Response Parameters:  125  Impression & Recommendations:  Problem # 1:  PACEMAKER, 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 Take 1/2  by mouth daily     Aspirin 81 Mg Tbec (Aspirin) .Marland Kitchen... Take one by mouth daily

## 2010-07-03 ENCOUNTER — Ambulatory Visit (INDEPENDENT_AMBULATORY_CARE_PROVIDER_SITE_OTHER): Payer: Medicare Other | Admitting: Internal Medicine

## 2010-07-03 ENCOUNTER — Encounter: Payer: Self-pay | Admitting: Internal Medicine

## 2010-07-03 DIAGNOSIS — I10 Essential (primary) hypertension: Secondary | ICD-10-CM

## 2010-07-03 DIAGNOSIS — E785 Hyperlipidemia, unspecified: Secondary | ICD-10-CM

## 2010-07-03 DIAGNOSIS — C61 Malignant neoplasm of prostate: Secondary | ICD-10-CM

## 2010-07-03 DIAGNOSIS — N259 Disorder resulting from impaired renal tubular function, unspecified: Secondary | ICD-10-CM

## 2010-07-04 ENCOUNTER — Telehealth: Payer: Self-pay | Admitting: Internal Medicine

## 2010-07-11 NOTE — Progress Notes (Signed)
Summary: requests zostavax  Phone Note Call from Patient   Caller: Patient Call For: Cindee Salt MD Summary of Call: Pt would like order for zostavax, wants sent to [facility].  He says he spoke with you about this at his visit yesterday.            Lowella Petties CMA, AAMA  July 04, 2010 10:41 AM   Follow-up for Phone Call        rx for zostavax sent, per protocol.  Follow-up by: Mervin Hack CMA (AAMA),  July 04, 2010 10:48 AM    New/Updated Medications: ZOSTAVAX 69629 UNT/0.65ML SOLR (ZOSTER VACCINE LIVE) injection to prevent shingles 0.21ml Prescriptions: ZOSTAVAX 52841 UNT/0.65ML SOLR (ZOSTER VACCINE LIVE) injection to prevent shingles 0.42ml  #1 x 0   Entered by:   Mervin Hack CMA (AAMA)   Authorized by:   Cindee Salt MD   Signed by:   Mervin Hack CMA (AAMA) on 07/04/2010   Method used:   Electronically to        [id number]       [address]       [place], [place]  [address]       Ph: [phone]       Fax: [phone]   RxID:   [id number]

## 2010-07-11 NOTE — Assessment & Plan Note (Signed)
Summary: 6 MONTH FOLLOW UP R/S FROM 07/10/10   Vital Signs:  Patient profile:   76 year old male Weight:      157 pounds Temp:     97.9 degrees F oral Pulse rate:   60 / minute Pulse rhythm:   regular BP sitting:   125 / 58  (left arm) Cuff size:   large  Vitals Entered By: Mervin Hack CMA Duncan Dull) (July 03, 2010 11:01 AM) CC: follow-up   History of Present Illness: Recent photography trip to [place] remains active Now will be starting fitness program at [facility] heart rate up to 120 with exercise  He feels fine No chest pain No SOB No edema No palpitations  No sig edema  Has kept up with Dr Graciela Husbands and pacemaker checks  Allergies: No Known Drug Allergies  Past History:  Past medical, surgical, family and social histories (including risk factors) reviewed for relevance to current acute and chronic problems.  Past Medical History: Reviewed history from 07/18/2009 and no changes required. RENAL INSUFFICIENCY (ICD-588.9) ADENOCARCINOMA, PROSTATE (ICD-185) GRAVE'S DISEASE (ICD-242.00) HYPERTENSION (ICD-401.9) HYPERLIPIDEMIA (ICD-272.4) ALLERGIC RHINITIS (ICD-477.9) SICK SINUS SYNDROME (ICD-427.81)-----------------------------------------------Dr Graciela Husbands  Past Surgical History: Reviewed history from 01/06/2007 and no changes required. Tonsils/adenoids  1947 Prostate cancer --RT  2007 Pacemaker--8/06  bradycardia/syncope  Family History: Reviewed history from 01/06/2007 and no changes required. Dad died in 15's  Blood clot during CABG Mom died in 55's     Some cancer, had DM 1 sister--good health CAD only in Dad DM just Mom No HTN No colon or prostate cancer  Social History: Reviewed history from 01/06/2007 and no changes required. 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 Wife to make health care decisions (then daughter Marjean Donna) Would accept  resuscitation No sure about feeding tube but no "unusual attempts"  Review of Systems       appetite is good weight stable sleeps well  Physical Exam  General:  alert and normal appearance.   Neck:  supple, no masses, no thyromegaly, and no cervical lymphadenopathy.   Lungs:  normal respiratory effort, no intercostal retractions, no accessory muscle use, and normal breath sounds.   Heart:  normal rate, regular rhythm, no murmur, and no gallop.   Msk:  no joint tenderness and no joint swelling.   Extremities:  no edema Psych:  normally interactive, good eye contact, not anxious appearing, and not depressed appearing.     Impression & Recommendations:  Problem # 1:  HYPERTENSION (ICD-401.9) Assessment Unchanged good control mild renal insuff labs next time  His updated medication list for this problem includes:    Diovan Hct 160-12.5 Mg Tabs (Valsartan-hydrochlorothiazide) .Marland Kitchen... Take 1 tablet by mouth once a day    Bisoprolol Fumarate 5 Mg Tabs (Bisoprolol fumarate) .Marland Kitchen... Take 1/2  by mouth daily  BP today: 125/58 Prior BP: 142/76 (06/11/2010)  Prior 10 Yr Risk Heart Disease: 14 % (01/17/2009)  Labs Reviewed: K+: 4.9 (01/15/2010) Creat: : 1.7 (01/15/2010)   Chol: 156 (01/15/2010)   HDL: 35.30 (01/15/2010)   LDL: 104 (01/15/2010)   TG: 83.0 (01/15/2010)  Problem # 2:  HYPERLIPIDEMIA (ICD-272.4) Assessment: Unchanged also on fish oil no side effects labs next time  His updated medication list for this problem includes:    Lipitor 10 Mg Tabs (Atorvastatin calcium) .Marland Kitchen... Take 1 tablet by mouth once a day  Labs Reviewed: SGOT: 28 (01/15/2010)   SGPT: 41 (01/15/2010)  Prior  10 Yr Risk Heart Disease: 14 % (01/17/2009)   HDL:35.30 (01/15/2010), 31.70 (01/17/2009)  LDL:104 (01/15/2010), 81 (98/03/9146)  Chol:156 (01/15/2010), 136 (01/17/2009)  Trig:83.0 (01/15/2010), 116.0 (01/17/2009)  Problem # 3:  ADENOCARCINOMA, PROSTATE (ICD-185) Assessment: Comment Only will check  PSA yearly after discussing  Problem # 4:  RENAL INSUFFICIENCY (ICD-588.9) Assessment: Unchanged creat 1.7  Complete Medication List: 1)  Lipitor 10 Mg Tabs (Atorvastatin calcium) .... Take 1 tablet by mouth once a day 2)  Diovan Hct 160-12.5 Mg Tabs (Valsartan-hydrochlorothiazide) .... Take 1 tablet by mouth once a day 3)  Bisoprolol Fumarate 5 Mg Tabs (Bisoprolol fumarate) .... Take 1/2  by mouth daily 4)  Aspirin 81 Mg Tbec (Aspirin) .... Take one by mouth daily 5)  Loratadine 10 Mg Tabs (Loratadine) .... Take 1 by mouth once daily 6)  Vitamin D 1000 Unit Tabs (Cholecalciferol) .... Take 1 by mouth once daily 7)  Centrum Silver Tabs (Multiple vitamins-minerals) .... Take 1 tablet by mouth once a day  Patient Instructions: 1)  Please schedule a follow-up appointment in 6 months .  Prescriptions: BISOPROLOL FUMARATE 5 MG  TABS (BISOPROLOL FUMARATE) Take 1/2  by mouth daily  #45 x 3   Entered by:   Mervin Hack CMA (AAMA)   Authorized by:   Cindee Salt MD   Signed by:   Mervin Hack CMA (AAMA) on 07/03/2010   Method used:   Faxed to ...       [facility]       [address] [place], [place]  [address]       Ph: [phone]       Fax: [phone]   RxID:   [id number] DIOVAN HCT 160-12.5 MG  TABS (VALSARTAN-HYDROCHLOROTHIAZIDE) Take 1 tablet by mouth once a day  #90 x 3   Entered by:   Mervin Hack CMA (AAMA)   Authorized by:   Cindee Salt MD   Signed by:   Mervin Hack CMA (AAMA) on 07/03/2010   Method used:   Faxed to ...       [facility]       [address] [place], [place]  [address]       Ph: [phone]       Fax: [phone]   RxID:   [id number] LIPITOR 10 MG  TABS (ATORVASTATIN CALCIUM) Take 1 tablet by mouth once a day  #90 x 3   Entered by:   Mervin Hack CMA (AAMA)   Authorized by:   Cindee Salt MD   Signed by:   Mervin Hack CMA (AAMA) on 07/03/2010   Method used:   Faxed to ...        [facility]       [address] [place], [place]  [address]       Ph: [phone]       Fax: [phone]   RxID:   [id number]    Orders Added: 1)  Est. Patient Level IV [46270]    Current Allergies (reviewed today): No known allergies

## 2010-07-11 NOTE — Progress Notes (Signed)
Summary: regarding zostavax  Phone Note Call from Patient   Caller: Patient Summary of Call: Pt now wants zostavax scripts for himself and wife Marjean Donna sent to [facility] [address]  Pt [facility] not [facility]               Lowella Petties CMA, AAMA  July 04, 2010 11:56 AM   Follow-up for Phone Call        Please change the Rx pharmacy for them Cindee Salt MD  July 04, 2010 12:38 PM   rx faxed to pharmacy Follow-up by: Mervin Hack CMA Duncan Dull),  July 04, 2010 5:20 PM    Prescriptions: ZOSTAVAX 04540 UNT/0.65ML SOLR (ZOSTER VACCINE LIVE) injection to prevent shingles 0.37ml  #1 x 0   Entered by:   Mervin Hack CMA (AAMA)   Authorized by:   Cindee Salt MD   Signed by:   Mervin Hack CMA (AAMA) on 07/04/2010   Method used:   Faxed to ...       [facility] (retail)       [address]       [place], [place]    [place]       Ph: [phone]       Fax: [phone]   RxID:   [id number]

## 2010-07-16 NOTE — Miscellaneous (Signed)
Summary: Medical Clearance   Home Care Report   Imported By: Kassie Mends 07/08/2010 11:45:32  _____________________________________________________________________  External Attachment:    Type:   Image     Comment:   External Document

## 2010-08-22 ENCOUNTER — Ambulatory Visit: Payer: Self-pay | Admitting: Ophthalmology

## 2010-09-09 ENCOUNTER — Ambulatory Visit: Payer: Self-pay | Admitting: Ophthalmology

## 2010-09-17 NOTE — Letter (Signed)
January 12, 2007    Jason Schwalbe, MD  [address] [place], [place] [address]   RE:  Jason Lambert, Jason Lambert  MRN:  [id number]  /  DOB:  1935-01-17   Dear Jason Lambert,   It was a pleasure to see Jason Lambert today, at your request,  regarding his fainting spells and previously-implanted pacemaker.   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   A couple of years ago, he started having fainting spells and, while the  specifics are not available, underwent pacemaker implantation in  [place], [place], receiving a BIOTRONIK device, which remediated  that problem.   He has a history of irregular pulse.  He was told that this was extra  beats and that these occurred infrequently.  His thromboembolic risk  factors in the event that this were atrial fibrillation, are notable  only for hypertension.   He has no exercise intolerance.  He has no chest pain, no peripheral  edema, nocturnal dyspnea or orthopnea.   His past medical history is quite complicated.  He has a history of 1)  prostate cancer, status post radiation therapy in the last year or so;  2) remote history of Graves disease with therapeutic PTU; 3)  hypertension; 4) dyslipidemia, on therapy.   His past surgical history is notable for remote T and A.   SOCIAL HISTORY:  He is retired.  He has two daughters, one lives in  [place], one in [place].  He does not use cigarettes or  recreational drugs.  He does use alcohol occasionally.  He and his wife  are both in good health.   REVIEW OF SYSTEMS:  Over multiple organ systems is negative, apart from  what is noted previously.   CURRENT MEDICATIONS INCLUDE:  1. Lipitor 10.  2. Metoprolol succinate 50.  3. Diovan/HCT 160/12.5.  4. Aspirin 81.   He has no known drug allergies.   EXAMINATION:   He is an elderly Caucasian male, appearing his stated age  of 3.  His blood pressure is 118/72, his pulse is 60.  HEENT EXAM:  Demonstrated no icterus or xanthelasma.  NECK:  The neck veins are flat and the carotids are brisk and full  bilaterally, without bruits.  BACK:  Without kyphosis or scoliosis.  LUNGS:  Clear.  HEART SOUNDS:  Regular with an early systolic murmur and an S4.  ABDOMEN:  Soft with active bowel sounds.  Femoral pulses were not examined.  Distal pulses were intact.  There was  no clubbing, cyanosis or edema.  NEUROLOGICAL EXAM:  Grossly normal.  SKIN:  Warm and dry.   His pacemaker was not interrogated today, as we did not have a Geophysical data processor.   IMPRESSION:  1. Recurrent syncope.  2. Status post pacemaker for #1, resolving the issue.  3. Normal cardiac evaluation by catheterization, echo and Myoview.  4. Remote hyperthyroidism, now apparently euthyroid.  5. Prostate cancer, status post radiation therapy.   Mr. Ehrmann, from a cardiac point of view, Jason Lambert, is doing quite well.  We will plan to bring him back next week with a BIOTRONIK programmer  interrogative device and reprogram, if  necessary.  We will also begin him on remote transtelephonic monitoring  and we will plan to see him again in one year's time.   If there is anything  further I can do in his care, please do not  hesitate to contact me.    Sincerely,      Duke Salvia, MD, [facility]  Electronically Signed    SCK/MedQ  DD: 01/12/2007  DT: 01/13/2007  Job #: [id number]

## 2010-09-17 NOTE — Assessment & Plan Note (Signed)
[facility]                         ELECTROPHYSIOLOGY OFFICE NOTE   OTTIS, VACHA                       MRN:          [id number]  DATE:01/22/2007                            DOB:          Sep 13, 1934    Mr. Hsiao was seen today in the clinic in [place] on January 22, 2007 for followup of his Biotronic model name is Philos D1-T.  Date of  implant was December 03, 2004 for sick sinus syndrome.   On interrogation of his device today his battery voltage is 2.79 with an  estimated longevity of 5.40 years.   P wave measured 4.9 to 5.9 millivolts with an atrial capture threshold  of 0.9 volts at 0.4 milliseconds and an atrial lead impedance of 510  ohms. R waves measured 18.4 millivolts to 21 millivolts with a  ventricular capture threshold of 0.3 volts at 0.4 milliseconds and a  ventricular lead impedance of 982 ohms.  There were three mode switch  episodes noted, he is ventricularly sensing about 96% of the time.  He  does have auto-capture programmed on in the ventricle.  No changes were  made in his parameters today and he is going to be seen back again in 6  month's time.      Altha Harm, LPN  Electronically Signed      Duke Salvia, MD, [facility]  Electronically Signed   PO/MedQ  DD: 01/22/2007  DT: 01/22/2007  Job #: [id number]

## 2010-09-17 NOTE — Letter (Signed)
April 17, 2008    Karie Schwalbe, MD  [address] [place], [place] [address]   RE:  Jason Lambert, Jason Lambert  MRN:  [id number]  /  DOB:  May 18, 1934   Dear [name],   Jason Lambert comes in today in followup for pacemaker implanted some time  ago for syncopal episodes.  He was asking whether it was normal or not  for him to be paced in the upper chamber more often than in the lower  chamber.  He was unclear as to why had a pacemaker implanted.  It sounds  like that he was having vasovagal episodes and syncope.  He has had some  problems with recurrent syncope or presyncope.  His pacemaker was  implanted.  These are interrupted only if he lies down.  They are  associated with typical epiphenomenon including warmth, diaphoresis, and  some nausea as well as being described as pale.   He has no complaints of chest pain or shortness of breath.  He has no  impairment in exercise tolerance.   His medications include:  1. Lipitor.  2. Diovan/HCT 160/12.5.  3. Aspirin.  4. Bisoprolol 2.5 a day.   On examination, his blood pressure is 134/70, his pulse was 60.  His  lungs were clear.  The heart sounds were regular.  The abdomen was soft,  and the extremities had no edema.   Electrocardiogram dated today demonstrated atrial paced rhythm at 60  with normal QRS interval 100 msec a QT of 0.41.  The axis was normal,  and there is no ST-T changes.   IMPRESSION:  1. Sinus node dysfunction with near total atrial pacing.  2. History of syncope probably neurally mediated.  3. Status post pacer for the above.  4. Hypertension on a diuretic.   Jason Lambert, Jason Lambert is doing well.  The only thought that I had is  whether it might be worth discontinuing the diuretic component that is  in his antihypertensive agent.  This may help to diminish the frequency  of his lightheaded spells.   We will see him again in 1 year's time.  If there is anything we can do  in the interim, please do not  hesitate to contact me.    Sincerely,      Duke Salvia, MD, [facility]  Electronically Signed    SCK/MedQ  DD: 04/17/2008  DT: 04/17/2008  Job #: [id number]

## 2010-10-24 ENCOUNTER — Encounter: Payer: Self-pay | Admitting: Cardiovascular Disease

## 2010-10-29 ENCOUNTER — Ambulatory Visit: Payer: Self-pay | Admitting: Ophthalmology

## 2010-11-04 ENCOUNTER — Ambulatory Visit: Payer: Self-pay | Admitting: Ophthalmology

## 2010-12-19 ENCOUNTER — Encounter: Payer: Self-pay | Admitting: Internal Medicine

## 2010-12-20 ENCOUNTER — Encounter: Payer: Self-pay | Admitting: Internal Medicine

## 2010-12-20 ENCOUNTER — Ambulatory Visit (INDEPENDENT_AMBULATORY_CARE_PROVIDER_SITE_OTHER): Payer: Medicare Other | Admitting: Internal Medicine

## 2010-12-20 DIAGNOSIS — N259 Disorder resulting from impaired renal tubular function, unspecified: Secondary | ICD-10-CM

## 2010-12-20 DIAGNOSIS — Z8546 Personal history of malignant neoplasm of prostate: Secondary | ICD-10-CM

## 2010-12-20 DIAGNOSIS — E0500 Thyrotoxicosis with diffuse goiter without thyrotoxic crisis or storm: Secondary | ICD-10-CM

## 2010-12-20 DIAGNOSIS — I10 Essential (primary) hypertension: Secondary | ICD-10-CM

## 2010-12-20 DIAGNOSIS — E785 Hyperlipidemia, unspecified: Secondary | ICD-10-CM

## 2010-12-20 LAB — CBC WITH DIFFERENTIAL/PLATELET
Eosinophils Absolute: 0.1 10*3/uL (ref 0.0–0.7)
Eosinophils Relative: 1 % (ref 0.0–5.0)
MCV: 97.6 fl (ref 78.0–100.0)
Monocytes Absolute: 0.8 10*3/uL (ref 0.1–1.0)
Neutrophils Relative %: 64.1 % (ref 43.0–77.0)
Platelets: 205 10*3/uL (ref 150.0–400.0)
WBC: 6 10*3/uL (ref 4.5–10.5)

## 2010-12-20 LAB — BASIC METABOLIC PANEL
BUN: 22 mg/dL (ref 6–23)
Chloride: 103 mEq/L (ref 96–112)
Creatinine, Ser: 1.7 mg/dL — ABNORMAL HIGH (ref 0.4–1.5)

## 2010-12-20 LAB — HEPATIC FUNCTION PANEL: Total Bilirubin: 0.9 mg/dL (ref 0.3–1.2)

## 2010-12-20 LAB — LIPID PANEL
Cholesterol: 122 mg/dL (ref 0–200)
HDL: 35.9 mg/dL — ABNORMAL LOW (ref >39.00)
Triglycerides: 134 mg/dL (ref 0.0–149.0)
VLDL: 26.8 mg/dL (ref 0.0–40.0)

## 2010-12-20 LAB — TSH: TSH: 1.86 u[IU]/mL (ref 0.35–5.50)

## 2010-12-20 LAB — PSA: PSA: 0.06 ng/mL — ABNORMAL LOW (ref 0.10–4.00)

## 2010-12-20 NOTE — Assessment & Plan Note (Signed)
Last creat 1.7 Will recheck

## 2010-12-20 NOTE — Assessment & Plan Note (Signed)
No problems with statin Due for labs 

## 2010-12-20 NOTE — Progress Notes (Signed)
Subjective:    Patient ID: Jason Lambert, male    DOB: [birth year], 76 y.o.   MRN: [id number]  HPI Doing well Going to fitness center 3 days per week and does weights 2 other days  Has noted some right shoulder pain--?related to weight work. Started about 1 month ago Not bad enough for meds  No dizziness or syncope No chest pain No SOB No sig edema  No problems tolerating statin  Allergies have not been a problem  Current Outpatient Prescriptions on File Prior to Visit  Medication Sig Dispense Refill  . aspirin 81 MG tablet Take 81 mg by mouth daily.        Marland Kitchen atorvastatin (LIPITOR) 10 MG tablet Take 10 mg by mouth daily.        . bisoprolol (ZEBETA) 5 MG tablet Take 2.5 mg by mouth daily.        . cholecalciferol (VITAMIN D) 1000 UNITS tablet Take 1,000 Units by mouth daily.        Marland Kitchen loratadine (CLARITIN) 10 MG tablet Take 10 mg by mouth daily.        . Multiple Vitamins-Minerals (CENTRUM SILVER PO) Take 1 tablet by mouth daily.        . valsartan-hydrochlorothiazide (DIOVAN-HCT) 160-12.5 MG per tablet Take 1 tablet by mouth daily.          No Known Allergies  Past Medical History  Diagnosis Date  . Renal insufficiency   . Adenocarcinoma     Prostate  . Grave's disease   . Hypertension   . Hyperlipidemia   . Allergic rhinitis   . Sick sinus syndrome     Dr. Graciela Husbands    Past Surgical History  Procedure Date  . Tonsillectomy 1947    adenoids  . Prostate cancer 2007    RT  . Pacemaker insertion 8/06    Bradycardia/ syncope    Family History  Problem Relation Age of Onset  . Cancer Mother   . Diabetes Mother   . Coronary artery disease Father   . Hypertension Neg Hx   . Prostate cancer Neg Hx   . Colon cancer Neg Hx     History   Social History  . Marital Status: Married    Spouse Name: N/A    Number of Children: 2  . Years of Education: N/A   Occupational History  . 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  Smoking status: Never Smoker   . Smokeless tobacco: Never Used  . Alcohol Use: Yes     Occasional  . Drug Use: Not on file  . Sexually Active: Not on file   Other Topics Concern  . Not on file   Social History Narrative   Walking fairly regularly Has a living will. Wife to make health care decisions (then daughter [name])Would accept resuscitationNot sure about feeding tube but no "unusual attempts"   Review of Systems Weight is stable Appetite is fine Sleeps well Mood is good    Objective:   Physical Exam  Constitutional: He appears well-developed and well-nourished. No distress.  Neck: Normal range of motion. Neck supple. No thyromegaly present.  Cardiovascular: Normal rate, regular rhythm, normal heart sounds and intact distal pulses.  Exam reveals no gallop.   No murmur heard. Pulmonary/Chest: Effort normal and breath sounds normal. No respiratory distress. He has no wheezes. He has no rales.  Abdominal: Soft. There is no tenderness.  Musculoskeletal: Normal range  of motion. He exhibits no edema and no tenderness.  Lymphadenopathy:    He has no cervical adenopathy.  Psychiatric: He has a normal mood and affect. His behavior is normal. Judgment and thought content normal.          Assessment & Plan:

## 2010-12-20 NOTE — Assessment & Plan Note (Signed)
BP Readings from Last 3 Encounters:  12/20/10 122/62  07/03/10 125/58  06/11/10 142/76   Good control Due for labs

## 2010-12-23 LAB — T4, FREE: Free T4: 0.97 ng/dL (ref 0.60–1.60)

## 2011-01-09 ENCOUNTER — Encounter: Payer: Self-pay | Admitting: *Deleted

## 2011-01-13 ENCOUNTER — Telehealth: Payer: Self-pay | Admitting: *Deleted

## 2011-01-13 NOTE — Telephone Encounter (Signed)
Pt was told he was not due for pacer check until February 2013 with Dr Graciela Husbands. Pt was also told to call back and schedule February Pacer check appointment in mid December, early January. Pt verbally agreed and asked for a remind letter if possible.

## 2011-06-17 ENCOUNTER — Telehealth: Payer: Self-pay | Admitting: Internal Medicine

## 2011-06-17 NOTE — Telephone Encounter (Signed)
error 

## 2011-06-24 ENCOUNTER — Encounter: Payer: Self-pay | Admitting: Internal Medicine

## 2011-06-24 ENCOUNTER — Ambulatory Visit (INDEPENDENT_AMBULATORY_CARE_PROVIDER_SITE_OTHER): Payer: Medicare Other | Admitting: Internal Medicine

## 2011-06-24 DIAGNOSIS — I10 Essential (primary) hypertension: Secondary | ICD-10-CM | POA: Diagnosis not present

## 2011-06-24 DIAGNOSIS — Z0000 Encounter for general adult medical examination without abnormal findings: Secondary | ICD-10-CM

## 2011-06-24 DIAGNOSIS — N259 Disorder resulting from impaired renal tubular function, unspecified: Secondary | ICD-10-CM

## 2011-06-24 DIAGNOSIS — E785 Hyperlipidemia, unspecified: Secondary | ICD-10-CM | POA: Diagnosis not present

## 2011-06-24 DIAGNOSIS — I495 Sick sinus syndrome: Secondary | ICD-10-CM | POA: Diagnosis not present

## 2011-06-24 MED ORDER — BISOPROLOL FUMARATE 5 MG PO TABS: 2.5000 mg | ORAL_TABLET | Freq: Every day | ORAL | Status: DC

## 2011-06-24 MED ORDER — ATORVASTATIN CALCIUM 10 MG PO TABS: 10.0000 mg | ORAL_TABLET | Freq: Every day | ORAL | Status: DC

## 2011-06-24 MED ORDER — VALSARTAN-HYDROCHLOROTHIAZIDE 160-12.5 MG PO TABS: 1.0000 | ORAL_TABLET | Freq: Every day | ORAL | Status: DC

## 2011-06-24 NOTE — Assessment & Plan Note (Signed)
BP Readings from Last 3 Encounters:  06/24/11 118/60  12/20/10 122/62  07/03/10 125/58   Has actually had some sig orthostasis Will cut the valsartan in half

## 2011-06-24 NOTE — Assessment & Plan Note (Signed)
Still on low dose bisoprolol and has pacer Continue med in case risk of tachy syndrome

## 2011-06-24 NOTE — Assessment & Plan Note (Signed)
Labs next time 

## 2011-06-24 NOTE — Progress Notes (Signed)
Subjective:    Patient ID: Jason Lambert, male    DOB: 13-Jun-1934, 77 y.o.   MRN: [id number]  HPI Here for wellness exam No falls or instability Not depressed or anhedonic UTD on colon PSA checking yearly with history of cancer  Stays in shape No chest pain No SOB Only gets edema if prolonged immobility like airplane trip Mild orthostatic dizziness if he stands too quick. Did have to lie down after an exercise class due to near syncope  Does fine on the statin No muscle aching other than expected after exercise  Current Outpatient Prescriptions on File Prior to Visit  Medication Sig Dispense Refill  . aspirin 81 MG tablet Take 81 mg by mouth daily.        . cholecalciferol (VITAMIN D) 1000 UNITS tablet Take 1,000 Units by mouth daily.        Marland Kitchen loratadine (CLARITIN) 10 MG tablet Take 10 mg by mouth daily.        . Multiple Vitamins-Minerals (CENTRUM SILVER PO) Take 1 tablet by mouth daily.          No Known Allergies  Past Medical History  Diagnosis Date  . Renal insufficiency   . Adenocarcinoma     Prostate  . Grave's disease   . Hypertension   . Hyperlipidemia   . Allergic rhinitis   . Sick sinus syndrome     Dr. Graciela Husbands    Past Surgical History  Procedure Date  . Tonsillectomy 1947    adenoids  . Prostate cancer 2007    RT  . Pacemaker insertion 8/06    Bradycardia/ syncope  . Cataract extraction w/ intraocular lens  implant, bilateral 2012    Family History  Problem Relation Age of Onset  . Cancer Mother   . Diabetes Mother   . Coronary artery disease Father   . Hypertension Neg Hx   . Prostate cancer Neg Hx   . Colon cancer Neg Hx     History   Social History  . Marital Status: Married    Spouse Name: N/A    Number of Children: 2  . Years of Education: N/A   Occupational History  . 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 Smoking status: Never Smoker   . Smokeless tobacco: Never Used  . Alcohol Use: Yes       Occasional  . Drug Use: Not on file  . Sexually Active: Not on file   Other Topics Concern  . Not on file   Social History Narrative   Walking fairly regularly Has a living will. Wife to make health care decisions (then daughter [name])Would accept resuscitationNot sure about feeding tube but no "unusual attempts"   Review of Systems Weight is stable Generally sleeps well    Objective:   Physical Exam  Constitutional: He is oriented to person, place, and time. He appears well-developed and well-nourished. No distress.  HENT:  Mouth/Throat: Oropharynx is clear and moist. No oropharyngeal exudate.  Neck: Normal range of motion. Neck supple. No thyromegaly present.  Cardiovascular: Normal rate, regular rhythm, normal heart sounds and intact distal pulses.  Exam reveals no gallop.   No murmur heard. Pulmonary/Chest: Effort normal and breath sounds normal. No respiratory distress. He has no wheezes. He has no rales.  Abdominal: Soft. There is no tenderness.  Musculoskeletal: He exhibits no edema and no tenderness.  Lymphadenopathy:    He has no cervical adenopathy.  Neurological:  He is alert and oriented to person, place, and time.       President --"Phillips Odor, [place]: [phone] D-[id number] Recall 3/3  Psychiatric: He has a normal mood and affect. His behavior is normal. Judgment and thought content normal.          Assessment & Plan:

## 2011-06-24 NOTE — Assessment & Plan Note (Signed)
I have personally reviewed the Medicare Annual Wellness questionnaire and have noted 1. The patient's medical and social history 2. Their use of alcohol, tobacco or illicit drugs 3. Their current medications and supplements 4. The patient's functional ability including ADL's, fall risks, home safety risks and hearing or visual             impairment. 5. Diet and physical activities 6. Evidence for depression or mood disorders  The patients weight, height, BMI and visual acuity have been recorded in the chart I have made referrals, counseling and provided education to the patient based review of the above and I have provided the pt with a written personalized care plan for preventive services.  I have provided you with a copy of your personalized plan for preventive services. Please take the time to review along with your updated medication list.  No concerns UTD on imms and cancer screening

## 2011-06-24 NOTE — Assessment & Plan Note (Signed)
Lab Results  Component Value Date   LDLCALC 59 12/20/2010   Good control No changes

## 2011-06-25 ENCOUNTER — Encounter: Payer: Self-pay | Admitting: *Deleted

## 2011-06-25 ENCOUNTER — Encounter: Payer: Medicare Other | Admitting: Internal Medicine

## 2011-06-26 ENCOUNTER — Ambulatory Visit (INDEPENDENT_AMBULATORY_CARE_PROVIDER_SITE_OTHER): Payer: Medicare Other | Admitting: Internal Medicine

## 2011-06-26 ENCOUNTER — Encounter: Payer: Self-pay | Admitting: Internal Medicine

## 2011-06-26 VITALS — BP 130/72 | Ht 66.0 in | Wt 161.2 lb

## 2011-06-26 DIAGNOSIS — I495 Sick sinus syndrome: Secondary | ICD-10-CM

## 2011-06-26 DIAGNOSIS — I10 Essential (primary) hypertension: Secondary | ICD-10-CM | POA: Diagnosis not present

## 2011-06-26 DIAGNOSIS — Z950 Presence of cardiac pacemaker: Secondary | ICD-10-CM | POA: Insufficient documentation

## 2011-06-26 DIAGNOSIS — Z79899 Other long term (current) drug therapy: Secondary | ICD-10-CM | POA: Diagnosis not present

## 2011-06-26 LAB — PACEMAKER DEVICE OBSERVATION
AL AMPLITUDE: 5.2 mv
ATRIAL PACING PM: 98
DEVICE MODEL PM: 75799178
RV LEAD THRESHOLD: 0.3 V
VENTRICULAR PACING PM: 6

## 2011-06-26 NOTE — Patient Instructions (Signed)
1 YR F/U WITH DR. Graciela Husbands  NO OTHER CHANGES TODAY

## 2011-06-26 NOTE — Progress Notes (Signed)
  HPI  Jason Lambert is a 77 y.o. male seen in followup for syncope and history of sinus node dysfunction for which he underwent Biotronik pacemaker implantation a number of years ago in [place].The patient denies SOB, chest pain, edema or palpitations  He is telling me of his multiple travels with an outfit called [employer]  Past Medical History  Diagnosis Date  . Renal insufficiency   . Adenocarcinoma     Prostate  . Grave's disease   . Hypertension   . Hyperlipidemia   . Allergic rhinitis   . sinus node dysfunction     Dr. Graciela Husbands  . Pacemaker - BIOTRONIK     Past Surgical History  Procedure Date  . Tonsillectomy 1947    adenoids  . Prostate cancer 2007    RT  . Pacemaker insertion 8/06    Bradycardia/ syncope  . Cataract extraction w/ intraocular lens  implant, bilateral 2012    Current Outpatient Prescriptions  Medication Sig Dispense Refill  . aspirin 81 MG tablet Take 81 mg by mouth daily.        Marland Kitchen atorvastatin (LIPITOR) 10 MG tablet Take 1 tablet (10 mg total) by mouth daily.  90 tablet  3  . bisoprolol (ZEBETA) 5 MG tablet Take 0.5 tablets (2.5 mg total) by mouth daily.  90 tablet  3  . cholecalciferol (VITAMIN D) 1000 UNITS tablet Take 1,000 Units by mouth daily.        Marland Kitchen loratadine (CLARITIN) 10 MG tablet Take 10 mg by mouth daily.        . Multiple Vitamins-Minerals (CENTRUM SILVER PO) Take 1 tablet by mouth daily.        . valsartan-hydrochlorothiazide (DIOVAN-HCT) 160-12.5 MG per tablet Take 0.5 tablets by mouth daily.        No Known Allergies  Review of Systems negative except from HPI and PMH  Physical Exam BP 130/72  Ht 5\' 6"  (1.676 m)  Wt 161 lb 4 oz (73.143 kg)  BMI 26.03 kg/m2 Well developed and well nourished in no acute distress HENT normal E scleral and icterus clear Neck Supple JVP flat; carotids brisk and full Clear to ausculation Regular rate and rhythm, no murmurs gallops or rub Soft with active bowel sounds No clubbing  cyanosis none Edema Alert and oriented, grossly normal motor and sensory function Skin Warm and Dry   Assessment and  Plan

## 2011-06-26 NOTE — Assessment & Plan Note (Signed)
Mostly  atrial paced; with reasonable heart rate excursion and no symptoms

## 2011-06-26 NOTE — Assessment & Plan Note (Signed)
Recent down titration and his Diovan

## 2011-06-26 NOTE — Assessment & Plan Note (Signed)
The patient's device was interrogated.  The information was reviewed. No changes were made in the programming.   He is on remote monitoring. We need to find a target of his transmissions

## 2011-07-30 ENCOUNTER — Telehealth: Payer: Self-pay

## 2011-07-30 NOTE — Telephone Encounter (Signed)
Pt called to update Dr Alphonsus Sias his BP is averaging 143/76 since taking Diovan HCT 160-12.5 mg 1/2 tab daily starting 06/24/11. Pt said dizziness has subsided. No symptoms to report (no chest pain, no h/a and no SOB). Pt wonders if Dr Alphonsus Sias wants him to continue the 1/2 tab daily. Pt said he has plenty of med. Pt last 3 BP readings at [facility] were 06/24/11 - 118/60, 12/20/10 - 122/62 and 07/03/10 - 125/58. Pt uses [facility] if pharmacy needed and can be reached at [phone].

## 2011-07-30 NOTE — Telephone Encounter (Signed)
Stick with the half pill  That BP is okay and I didn't want him to be dizzy when standing up We will reeval at his next scheduled visit

## 2011-07-30 NOTE — Telephone Encounter (Signed)
Spoke with patient and advised results   

## 2011-08-29 DIAGNOSIS — H2510 Age-related nuclear cataract, unspecified eye: Secondary | ICD-10-CM | POA: Diagnosis not present

## 2011-08-29 DIAGNOSIS — Z961 Presence of intraocular lens: Secondary | ICD-10-CM | POA: Diagnosis not present

## 2011-09-25 ENCOUNTER — Encounter: Payer: Medicare Other | Admitting: *Deleted

## 2011-12-22 ENCOUNTER — Ambulatory Visit (INDEPENDENT_AMBULATORY_CARE_PROVIDER_SITE_OTHER): Payer: Medicare Other | Admitting: *Deleted

## 2011-12-22 ENCOUNTER — Encounter: Payer: Self-pay | Admitting: Internal Medicine

## 2011-12-22 DIAGNOSIS — I495 Sick sinus syndrome: Secondary | ICD-10-CM | POA: Diagnosis not present

## 2011-12-22 LAB — PACEMAKER DEVICE OBSERVATION
AL IMPEDENCE PM: 498 Ohm
AL THRESHOLD: 0.8 V
ATRIAL PACING PM: 98
RV LEAD AMPLITUDE: 17.1 mv
RV LEAD THRESHOLD: 0.3 V

## 2011-12-22 NOTE — Progress Notes (Signed)
PPM check with industry

## 2011-12-24 ENCOUNTER — Encounter: Payer: Self-pay | Admitting: Internal Medicine

## 2011-12-24 ENCOUNTER — Ambulatory Visit (INDEPENDENT_AMBULATORY_CARE_PROVIDER_SITE_OTHER): Payer: Medicare Other | Admitting: Internal Medicine

## 2011-12-24 VITALS — BP 110/62 | HR 61 | Temp 97.5°F | Ht 66.0 in | Wt 164.0 lb

## 2011-12-24 DIAGNOSIS — E785 Hyperlipidemia, unspecified: Secondary | ICD-10-CM | POA: Diagnosis not present

## 2011-12-24 DIAGNOSIS — E0500 Thyrotoxicosis with diffuse goiter without thyrotoxic crisis or storm: Secondary | ICD-10-CM | POA: Diagnosis not present

## 2011-12-24 DIAGNOSIS — I10 Essential (primary) hypertension: Secondary | ICD-10-CM

## 2011-12-24 DIAGNOSIS — Z8546 Personal history of malignant neoplasm of prostate: Secondary | ICD-10-CM | POA: Diagnosis not present

## 2011-12-24 LAB — HEPATIC FUNCTION PANEL
ALT: 22 U/L (ref 0–53)
Alkaline Phosphatase: 79 U/L (ref 39–117)
Bilirubin, Direct: 0.1 mg/dL (ref 0.0–0.3)
Total Bilirubin: 0.9 mg/dL (ref 0.3–1.2)
Total Protein: 6.7 g/dL (ref 6.0–8.3)

## 2011-12-24 LAB — BASIC METABOLIC PANEL
BUN: 19 mg/dL (ref 6–23)
CO2: 28 mEq/L (ref 19–32)
Chloride: 100 mEq/L (ref 96–112)
GFR: 43.73 mL/min — ABNORMAL LOW (ref >60.00)
Glucose, Bld: 111 mg/dL — ABNORMAL HIGH (ref 70–99)
Potassium: 4.8 mEq/L (ref 3.5–5.1)
Sodium: 135 mEq/L (ref 135–145)

## 2011-12-24 LAB — CBC WITH DIFFERENTIAL/PLATELET
Basophils Absolute: 0 10*3/uL (ref 0.0–0.1)
HCT: 40.3 % (ref 39.0–52.0)
Hemoglobin: 13.6 g/dL (ref 13.0–17.0)
Lymphs Abs: 1.4 10*3/uL (ref 0.7–4.0)
MCHC: 33.6 g/dL (ref 30.0–36.0)
MCV: 97.1 fl (ref 78.0–100.0)
Monocytes Absolute: 0.8 10*3/uL (ref 0.1–1.0)
Monocytes Relative: 14 % — ABNORMAL HIGH (ref 3.0–12.0)
Neutro Abs: 3.5 10*3/uL (ref 1.4–7.7)
Platelets: 207 10*3/uL (ref 150.0–400.0)
RDW: 12.9 % (ref 11.5–14.6)

## 2011-12-24 LAB — LIPID PANEL
Cholesterol: 126 mg/dL (ref 0–200)
HDL: 32.5 mg/dL — ABNORMAL LOW (ref >39.00)
Total CHOL/HDL Ratio: 4
Triglycerides: 172 mg/dL — ABNORMAL HIGH (ref 0.0–149.0)

## 2011-12-24 LAB — TSH: TSH: 2.15 u[IU]/mL (ref 0.35–5.50)

## 2011-12-24 NOTE — Assessment & Plan Note (Signed)
BP Readings from Last 3 Encounters:  12/24/11 110/62  06/26/11 130/72  06/24/11 118/60   Still low Will stop the diovan and he will monitor his BP at home

## 2011-12-24 NOTE — Assessment & Plan Note (Signed)
Due for PSA

## 2011-12-24 NOTE — Progress Notes (Signed)
Subjective:    Patient ID: Jason Lambert, male    DOB: Sep 13, 1934, 77 y.o.   MRN: [id number]  HPI Here with wife Doing well Continues to travel  Reviewed Graves disease Apparently went into remission after Rx with PTU No new concerns or neck masses  No chest pain No SOB Continues to exercise regularly with Jason Lambert at [facility] lightheadedness with certain exercises--better with diovan cut in half  RT for prostate cancer in 2006 or so Void fine Nocturia x 1 in general Current Outpatient Prescriptions on File Prior to Visit  Medication Sig Dispense Refill  . aspirin 81 MG tablet Take 81 mg by mouth daily.        Marland Kitchen atorvastatin (LIPITOR) 10 MG tablet Take 1 tablet (10 mg total) by mouth daily.  90 tablet  3  . bisoprolol (ZEBETA) 5 MG tablet Take 0.5 tablets (2.5 mg total) by mouth daily.  90 tablet  3  . cholecalciferol (VITAMIN D) 1000 UNITS tablet Take 1,000 Units by mouth daily.        Marland Kitchen loratadine (CLARITIN) 10 MG tablet Take 10 mg by mouth daily.        . Multiple Vitamins-Minerals (CENTRUM SILVER PO) Take 1 tablet by mouth daily.        . valsartan-hydrochlorothiazide (DIOVAN-HCT) 160-12.5 MG per tablet Take 0.5 tablets by mouth daily.        No Known Allergies  Past Medical History  Diagnosis Date  . Renal insufficiency   . Adenocarcinoma     Prostate  . Grave's disease   . Hypertension   . Hyperlipidemia   . Allergic rhinitis   . sinus node dysfunction     Dr. Graciela Lambert  . Pacemaker - BIOTRONIK     Past Surgical History  Procedure Date  . Tonsillectomy 1947    adenoids  . Prostate cancer 2007    RT  . Pacemaker insertion 8/06    Bradycardia/ syncope  . Cataract extraction w/ intraocular lens  implant, bilateral 2012    Family History  Problem Relation Age of Onset  . Cancer Mother   . Diabetes Mother   . Coronary artery disease Father     cabg  . Hypertension Neg Hx   . Prostate cancer Neg Hx   . Colon cancer Neg Hx     History   Social  History  . Marital Status: Married    Spouse Name: N/A    Number of Children: 2  . Years of Education: N/A   Occupational History  . 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 Smoking status: Never Smoker   . Smokeless tobacco: Never Used  . Alcohol Use: Yes     Occasional  . Drug Use: No  . Sexually Active: Not on file   Other Topics Concern  . Not on file   Social History Narrative   Walking fairly regularly Has a living will. Wife to make health care decisions (then daughter [name])Would accept resuscitationNot sure about feeding tube but no "unusual attempts"   Review of Systems Weight stable Appetite is fine Sleeps well    Objective:   Physical Exam  Constitutional: He appears well-developed and well-nourished. No distress.  Neck: Normal range of motion. Neck supple. No thyromegaly present.  Cardiovascular: Normal rate, regular rhythm, normal heart sounds and intact distal pulses.  Exam reveals no gallop.   No murmur heard. Pulmonary/Chest: Effort normal and breath sounds normal.  No respiratory distress. He has no wheezes. He has no rales.  Abdominal: Soft. There is no tenderness.  Musculoskeletal: He exhibits no edema and no tenderness.  Lymphadenopathy:    He has no cervical adenopathy.  Psychiatric: He has a normal mood and affect. His behavior is normal. Thought content normal.          Assessment & Plan:

## 2011-12-24 NOTE — Assessment & Plan Note (Signed)
Remission on PTU int he past Will recheck labs

## 2011-12-24 NOTE — Assessment & Plan Note (Signed)
No problems on the med Due for labs

## 2011-12-29 ENCOUNTER — Encounter: Payer: Self-pay | Admitting: *Deleted

## 2012-01-12 ENCOUNTER — Telehealth: Payer: Self-pay

## 2012-01-12 NOTE — Telephone Encounter (Signed)
The blood pressures are okay but not spectacular If he is having trouble with edema, I would just restart the HCTZ--not the diovan part If he is agreeable, send triamterene/HCTZ 37.5/25 1 daily #30 x 11 Needs met b in about 4 weeks to check potassium and kidney function

## 2012-01-12 NOTE — Telephone Encounter (Signed)
Diovan HCT was stopped on 12/24/11; average over the last 3 weeks BP is 143/76. Today BP is 137/76. The highest systolic has been 159.Pt's ankles have been swelling since stopped Diovan HCT; ankles go down slightly while asleep but have some swelling all the time. [facility].Please advise.

## 2012-01-13 MED ORDER — TRIAMTERENE-HCTZ 37.5-25 MG PO TABS: 1.0000 | ORAL_TABLET | Freq: Every day | ORAL | Status: DC

## 2012-01-13 NOTE — Telephone Encounter (Signed)
Spoke with patient and advised results rx sent to pharmacy by e-script Patient will call in 4 weeks for labs

## 2012-02-02 ENCOUNTER — Other Ambulatory Visit: Payer: Self-pay | Admitting: Internal Medicine

## 2012-02-02 DIAGNOSIS — I10 Essential (primary) hypertension: Secondary | ICD-10-CM

## 2012-02-06 ENCOUNTER — Other Ambulatory Visit (INDEPENDENT_AMBULATORY_CARE_PROVIDER_SITE_OTHER): Payer: Medicare Other

## 2012-02-06 DIAGNOSIS — I10 Essential (primary) hypertension: Secondary | ICD-10-CM | POA: Diagnosis not present

## 2012-02-06 LAB — BASIC METABOLIC PANEL
BUN: 23 mg/dL (ref 6–23)
Chloride: 100 mEq/L (ref 96–112)
Creatinine, Ser: 1.6 mg/dL — ABNORMAL HIGH (ref 0.4–1.5)
GFR: 43.72 mL/min — ABNORMAL LOW (ref >60.00)
Glucose, Bld: 149 mg/dL — ABNORMAL HIGH (ref 70–99)
Potassium: 4.6 mEq/L (ref 3.5–5.1)

## 2012-02-09 ENCOUNTER — Encounter: Payer: Self-pay | Admitting: *Deleted

## 2012-03-02 DIAGNOSIS — Z23 Encounter for immunization: Secondary | ICD-10-CM | POA: Diagnosis not present

## 2012-05-07 DIAGNOSIS — Z23 Encounter for immunization: Secondary | ICD-10-CM | POA: Diagnosis not present

## 2012-06-23 ENCOUNTER — Ambulatory Visit (INDEPENDENT_AMBULATORY_CARE_PROVIDER_SITE_OTHER): Payer: Medicare Other | Admitting: Internal Medicine

## 2012-06-23 ENCOUNTER — Encounter: Payer: Self-pay | Admitting: Internal Medicine

## 2012-06-23 VITALS — BP 148/70 | HR 61 | Temp 97.6°F | Wt 156.0 lb

## 2012-06-23 DIAGNOSIS — I10 Essential (primary) hypertension: Secondary | ICD-10-CM | POA: Diagnosis not present

## 2012-06-23 DIAGNOSIS — E785 Hyperlipidemia, unspecified: Secondary | ICD-10-CM | POA: Diagnosis not present

## 2012-06-23 DIAGNOSIS — I495 Sick sinus syndrome: Secondary | ICD-10-CM | POA: Diagnosis not present

## 2012-06-23 MED ORDER — ATORVASTATIN CALCIUM 10 MG PO TABS: 10.0000 mg | ORAL_TABLET | Freq: Every day | ORAL | Status: DC

## 2012-06-23 MED ORDER — TRIAMTERENE-HCTZ 37.5-25 MG PO TABS: 1.0000 | ORAL_TABLET | Freq: Every day | ORAL | Status: DC

## 2012-06-23 MED ORDER — BISOPROLOL FUMARATE 5 MG PO TABS: 2.5000 mg | ORAL_TABLET | Freq: Every day | ORAL | Status: DC

## 2012-06-23 NOTE — Assessment & Plan Note (Signed)
Lab Results  Component Value Date   LDLCALC 59 12/24/2011   Doing well on the med No changes needed

## 2012-06-23 NOTE — Progress Notes (Signed)
Subjective:    Patient ID: Jason Lambert, male    DOB: 1935/04/12, 78 y.o.   MRN: [id number]  HPI Doing well Recent nice visit to the [place]  Doing well No edema again back on diuretic Checks BP once in a while---  130-150/70-80's Occ migraine headache--bufferin helps No chest pain, palpitations, SOB Maintains active exercise with zumba and weight classes at [facility]  No problems with statin No myalgias or stomach trouble  Takes the loratadine regularly Mostly seasonal--discussed prn use  Current Outpatient Prescriptions on File Prior to Visit  Medication Sig Dispense Refill  . aspirin 81 MG tablet Take 81 mg by mouth daily.        . cholecalciferol (VITAMIN D) 1000 UNITS tablet Take 1,000 Units by mouth daily.        Marland Kitchen loratadine (CLARITIN) 10 MG tablet Take 10 mg by mouth daily.        . Multiple Vitamins-Minerals (CENTRUM SILVER PO) Take 1 tablet by mouth daily.         No current facility-administered medications on file prior to visit.    No Known Allergies  Past Medical History  Diagnosis Date  . Renal insufficiency   . Adenocarcinoma     Prostate  . Grave's disease   . Hypertension   . Hyperlipidemia   . Allergic rhinitis   . sinus node dysfunction     Dr. Graciela Husbands  . Pacemaker - BIOTRONIK     Past Surgical History  Procedure Laterality Date  . Tonsillectomy  1947    adenoids  . Prostate cancer  2007    RT  . Pacemaker insertion  8/06    Bradycardia/ syncope  . Cataract extraction w/ intraocular lens  implant, bilateral  2012    Family History  Problem Relation Age of Onset  . Cancer Mother   . Diabetes Mother   . Coronary artery disease Father     cabg  . Hypertension Neg Hx   . Prostate cancer Neg Hx   . Colon cancer Neg Hx     History   Social History  . Marital Status: Married    Spouse Name: N/A    Number of Children: 2  . Years of Education: N/A   Occupational History  . 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 Smoking status: Never Smoker   . Smokeless tobacco: Never Used  . Alcohol Use: Yes     Comment: Occasional  . Drug Use: No  . Sexually Active: Not on file   Other Topics Concern  . Not on file   Social History Narrative   Walking fairly regularly    Has a living will. Wife to make health care decisions (then daughter Marjean Donna)   Would accept resuscitation   Not sure about feeding tube but no "unusual attempts"         Review of Systems Vertigo is gone on the diuretic Sleeps well Appetite is good  Weight stable    Objective:   Physical Exam  Constitutional: He appears well-developed and well-nourished. No distress.  Neck: Normal range of motion. Neck supple. No thyromegaly present.  Cardiovascular: Normal rate, regular rhythm, normal heart sounds and intact distal pulses.  Exam reveals no gallop.   No murmur heard. Pulmonary/Chest: Effort normal and breath sounds normal. No respiratory distress. He has no wheezes. He has no rales.  Abdominal: Soft. There is no tenderness.  Musculoskeletal: He exhibits  no edema and no tenderness.  Lymphadenopathy:    He has no cervical adenopathy.  Psychiatric: He has a normal mood and affect. His behavior is normal.          Assessment & Plan:

## 2012-06-23 NOTE — Assessment & Plan Note (Signed)
Has pacer Due for recheck soon

## 2012-06-23 NOTE — Assessment & Plan Note (Signed)
BP Readings from Last 3 Encounters:  06/23/12 148/70  12/24/11 110/62  06/26/11 130/72   Up some off the diovan but still acceptable No edema on the med (and has controlled vertigo)

## 2012-06-23 NOTE — Assessment & Plan Note (Signed)
Seems euthyroid No apparent recurrence Will check labs yearly

## 2012-06-29 ENCOUNTER — Ambulatory Visit (INDEPENDENT_AMBULATORY_CARE_PROVIDER_SITE_OTHER): Payer: Medicare Other | Admitting: Internal Medicine

## 2012-06-29 ENCOUNTER — Encounter: Payer: Self-pay | Admitting: Internal Medicine

## 2012-06-29 VITALS — BP 120/70 | HR 60 | Ht 64.0 in | Wt 159.5 lb

## 2012-06-29 DIAGNOSIS — Z950 Presence of cardiac pacemaker: Secondary | ICD-10-CM

## 2012-06-29 DIAGNOSIS — I495 Sick sinus syndrome: Secondary | ICD-10-CM | POA: Diagnosis not present

## 2012-06-29 LAB — PACEMAKER DEVICE OBSERVATION
AL IMPEDENCE PM: 476 Ohm
ATRIAL PACING PM: 98
BATTERY VOLTAGE: 2.67 V
RV LEAD IMPEDENCE PM: 941 Ohm

## 2012-06-29 NOTE — Progress Notes (Signed)
  HPI  Jason Lambert is a 78 y.o. male seen in followup for syncope and history of sinus node dysfunction for which he underwent Biotronik pacemaker implantation a number of years ago in [place]  The patient denies chest pain, shortness of breath, nocturnal dyspnea, orthopnea or peripheral edema.  There have been no palpitations, lightheadedness or syncope.      Past Medical History  Diagnosis Date  . Renal insufficiency   . Adenocarcinoma     Prostate  . Grave's disease   . Hypertension   . Hyperlipidemia   . Allergic rhinitis   . sinus node dysfunction     Dr. Graciela Husbands  . Pacemaker - BIOTRONIK     Past Surgical History  Procedure Laterality Date  . Tonsillectomy  1947    adenoids  . Prostate cancer  2007    RT  . Pacemaker insertion  8/06    Bradycardia/ syncope  . Cataract extraction w/ intraocular lens  implant, bilateral  2012    Current Outpatient Prescriptions  Medication Sig Dispense Refill  . aspirin 81 MG tablet Take 81 mg by mouth daily.        Marland Kitchen atorvastatin (LIPITOR) 10 MG tablet Take 1 tablet (10 mg total) by mouth daily.  90 tablet  3  . bisoprolol (ZEBETA) 5 MG tablet Take 0.5 tablets (2.5 mg total) by mouth daily.  90 tablet  3  . cholecalciferol (VITAMIN D) 1000 UNITS tablet Take 1,000 Units by mouth daily.        Marland Kitchen loratadine (CLARITIN) 10 MG tablet Take 10 mg by mouth as needed.       . Multiple Vitamins-Minerals (CENTRUM SILVER PO) Take 1 tablet by mouth daily.        Marland Kitchen triamterene-hydrochlorothiazide (MAXZIDE-25) 37.5-25 MG per tablet Take 1 each (1 tablet total) by mouth daily.  90 tablet  3   No current facility-administered medications for this visit.    No Known Allergies  Review of Systems negative except from HPI and PMH  Physical Exam BP 120/70  Pulse 60  Ht 5\' 4"  (1.626 m)  Wt 159 lb 8 oz (72.349 kg)  BMI 27.36 kg/m2 Well developed and well nourished in no acute distress HENT normal E scleral and icterus clear Neck  Supple JVP flat; carotids brisk and full Clear to ausculation Regular rate and rhythm, no murmurs gallops or rub Soft with active bowel sounds No clubbing cyanosis none Edema Alert and oriented, grossly normal motor and sensory function Skin Warm and Dry   Assessment and  Plan

## 2012-06-29 NOTE — Patient Instructions (Addendum)
Your physician wants you to follow-up in: with Dr. Graciela Husbands in 1 year. You will receive a reminder letter in the mail two months in advance. If you don't receive a letter, please call our office to schedule the follow-up appointment.

## 2012-06-29 NOTE — Assessment & Plan Note (Signed)
We have reviewed end-of-life behavior. His Biotronik device will revert to VDD which will result in him taking asynchronously.

## 2012-06-29 NOTE — Assessment & Plan Note (Signed)
Stable with adequate heart rate excursion.

## 2012-09-03 DIAGNOSIS — Z961 Presence of intraocular lens: Secondary | ICD-10-CM | POA: Diagnosis not present

## 2012-09-03 DIAGNOSIS — H2510 Age-related nuclear cataract, unspecified eye: Secondary | ICD-10-CM | POA: Diagnosis not present

## 2012-12-20 ENCOUNTER — Ambulatory Visit (INDEPENDENT_AMBULATORY_CARE_PROVIDER_SITE_OTHER): Payer: Medicare Other | Admitting: *Deleted

## 2012-12-20 DIAGNOSIS — I495 Sick sinus syndrome: Secondary | ICD-10-CM

## 2012-12-20 LAB — PACEMAKER DEVICE OBSERVATION
AL THRESHOLD: 0.8 V
ATRIAL PACING PM: 99
DEVICE MODEL PM: 75799178
RV LEAD IMPEDENCE PM: 982 Ohm
RV LEAD THRESHOLD: 0.3 V
VENTRICULAR PACING PM: 16

## 2012-12-20 NOTE — Progress Notes (Signed)
PPM check in by industry in office.

## 2013-01-05 ENCOUNTER — Encounter: Payer: Medicare Other | Admitting: Internal Medicine

## 2013-02-07 ENCOUNTER — Ambulatory Visit (INDEPENDENT_AMBULATORY_CARE_PROVIDER_SITE_OTHER): Payer: Medicare Other | Admitting: *Deleted

## 2013-02-07 DIAGNOSIS — I495 Sick sinus syndrome: Secondary | ICD-10-CM | POA: Diagnosis not present

## 2013-02-07 LAB — PACEMAKER DEVICE OBSERVATION: DEVICE MODEL PM: 75799178

## 2013-02-07 NOTE — Progress Notes (Signed)
Interrogation only for battery longevity.

## 2013-02-22 ENCOUNTER — Encounter: Payer: Self-pay | Admitting: Internal Medicine

## 2013-03-04 ENCOUNTER — Encounter: Payer: Medicare Other | Admitting: Internal Medicine

## 2013-03-10 ENCOUNTER — Other Ambulatory Visit: Payer: Self-pay

## 2013-03-25 ENCOUNTER — Ambulatory Visit (INDEPENDENT_AMBULATORY_CARE_PROVIDER_SITE_OTHER): Payer: Medicare Other | Admitting: Internal Medicine

## 2013-03-25 ENCOUNTER — Encounter: Payer: Self-pay | Admitting: Internal Medicine

## 2013-03-25 VITALS — BP 138/84 | HR 60 | Ht 63.0 in | Wt 156.0 lb

## 2013-03-25 DIAGNOSIS — I495 Sick sinus syndrome: Secondary | ICD-10-CM | POA: Diagnosis not present

## 2013-03-25 DIAGNOSIS — I10 Essential (primary) hypertension: Secondary | ICD-10-CM | POA: Diagnosis not present

## 2013-03-25 DIAGNOSIS — Z950 Presence of cardiac pacemaker: Secondary | ICD-10-CM

## 2013-03-25 LAB — MDC_IDC_ENUM_SESS_TYPE_INCLINIC
Brady Statistic RA Percent Paced: 99 %
Lead Channel Pacing Threshold Amplitude: 0.8 V
Lead Channel Pacing Threshold Pulse Width: 0.4 ms
Lead Channel Sensing Intrinsic Amplitude: 18.3 mV
Lead Channel Sensing Intrinsic Amplitude: 5.3 mV

## 2013-03-25 NOTE — Progress Notes (Signed)
      Patient Care Team: Karie Schwalbe, MD as PCP - General   HPI  Jason Lambert is a 78 y.o. male seen in followup for syncope and history of sinus node dysfunction for which he underwent Biotronik pacemaker implantation a number of years ago in [place]  The patient denies chest pain, shortness of breath, nocturnal dyspnea, orthopnea or peripheral edema. There have been no palpitations, lightheadedness or syncope.    Past Medical History  Diagnosis Date  . Renal insufficiency   . Adenocarcinoma     Prostate  . Grave's disease   . Hypertension   . Hyperlipidemia   . Allergic rhinitis   . sinus node dysfunction     Dr. Graciela Husbands  . Pacemaker - BIOTRONIK     Past Surgical History  Procedure Laterality Date  . Tonsillectomy  1947    adenoids  . Prostate cancer  2007    RT  . Pacemaker insertion  8/06    Bradycardia/ syncope  . Cataract extraction w/ intraocular lens  implant, bilateral  2012    Current Outpatient Prescriptions  Medication Sig Dispense Refill  . aspirin 81 MG tablet Take 81 mg by mouth daily.        Marland Kitchen atorvastatin (LIPITOR) 10 MG tablet Take 1 tablet (10 mg total) by mouth daily.  90 tablet  3  . bisoprolol (ZEBETA) 5 MG tablet Take 0.5 tablets (2.5 mg total) by mouth daily.  90 tablet  3  . cholecalciferol (VITAMIN D) 1000 UNITS tablet Take 1,000 Units by mouth daily.        Marland Kitchen loratadine (CLARITIN) 10 MG tablet Take 10 mg by mouth as needed.       . Multiple Vitamins-Minerals (CENTRUM SILVER PO) Take 1 tablet by mouth daily.        Marland Kitchen triamterene-hydrochlorothiazide (MAXZIDE-25) 37.5-25 MG per tablet Take 1 each (1 tablet total) by mouth daily.  90 tablet  3   No current facility-administered medications for this visit.    No Known Allergies  Review of Systems negative except from HPI and PMH  Physical Exam BP 138/84  Pulse 60  Ht 5\' 3"  (1.6 m)  Wt 156 lb (70.761 kg)  BMI 27.64 kg/m2 Well developed and nourished in no acute  distress HENT normal Neck supple with JVP-flat Clea. Device pocket well healed; without hematoma or erythema.  There is no tethering  Regular rate and rhythm, no murmurs or gallops Abd-soft with active BS No Clubbing cyanosis edema Skin-warm and dry A & Oriented  Grossly normal sensory and motor function     Assessment and  Plan

## 2013-03-25 NOTE — Assessment & Plan Note (Signed)
Approaching ERI

## 2013-03-25 NOTE — Patient Instructions (Addendum)
Your physician recommends that you schedule a follow-up appointment on 04/18/13 with device clinic in [place] office.  Your physician recommends that you continue on your current medications as directed. Please refer to the Current Medication list given to you today.  I Dory Horn, RN) will be in touch next week to schedule generator change for end of December.

## 2013-03-25 NOTE — Assessment & Plan Note (Signed)
Stable post pacing 

## 2013-03-30 ENCOUNTER — Telehealth: Payer: Self-pay | Admitting: *Deleted

## 2013-03-30 ENCOUNTER — Other Ambulatory Visit: Payer: Self-pay | Admitting: *Deleted

## 2013-03-30 ENCOUNTER — Encounter: Payer: Self-pay | Admitting: *Deleted

## 2013-03-30 DIAGNOSIS — I495 Sick sinus syndrome: Secondary | ICD-10-CM

## 2013-03-30 NOTE — Telephone Encounter (Signed)
Called patient to schedule PPM generator change 05/02/2013. Labs scheduled for 04/25/2013 in [place]. Letter of instructions mailed to patient. Discussed procedure/instructions. Patient verbalized understanding and agreeable to plan.

## 2013-04-11 ENCOUNTER — Encounter: Payer: Self-pay | Admitting: Internal Medicine

## 2013-04-11 ENCOUNTER — Ambulatory Visit (INDEPENDENT_AMBULATORY_CARE_PROVIDER_SITE_OTHER): Payer: Medicare Other | Admitting: Internal Medicine

## 2013-04-11 VITALS — BP 114/60 | HR 60 | Temp 98.4°F | Ht 65.5 in | Wt 155.5 lb

## 2013-04-11 DIAGNOSIS — E785 Hyperlipidemia, unspecified: Secondary | ICD-10-CM | POA: Diagnosis not present

## 2013-04-11 DIAGNOSIS — I10 Essential (primary) hypertension: Secondary | ICD-10-CM

## 2013-04-11 DIAGNOSIS — E0500 Thyrotoxicosis with diffuse goiter without thyrotoxic crisis or storm: Secondary | ICD-10-CM

## 2013-04-11 DIAGNOSIS — Z8546 Personal history of malignant neoplasm of prostate: Secondary | ICD-10-CM | POA: Diagnosis not present

## 2013-04-11 DIAGNOSIS — Z0000 Encounter for general adult medical examination without abnormal findings: Secondary | ICD-10-CM | POA: Diagnosis not present

## 2013-04-11 MED ORDER — ATORVASTATIN CALCIUM 10 MG PO TABS: 10.0000 mg | ORAL_TABLET | Freq: Every day | ORAL | Status: DC

## 2013-04-11 MED ORDER — TRIAMTERENE-HCTZ 37.5-25 MG PO TABS: 1.0000 | ORAL_TABLET | Freq: Every day | ORAL | Status: DC

## 2013-04-11 NOTE — Assessment & Plan Note (Signed)
No problems with the med 

## 2013-04-11 NOTE — Assessment & Plan Note (Signed)
No symptoms 

## 2013-04-11 NOTE — Assessment & Plan Note (Signed)
PTU in distant past

## 2013-04-11 NOTE — Progress Notes (Signed)
Subjective:    Patient ID: Jason Lambert, male    DOB: 01/30/1935, 78 y.o.   MRN: [id number]  HPI Here for Medicare wellness and follow up Only sees cardiologist, dentist and eye doctor May need new pacemaker soon No depression or anhedonia Exercises regularly Rare alcohol. No tobacco. Vision and hearing are okay---wife is concerned about his hearing though No cognitive decline Stays active Independent in instrumental ADLs  Has pacer No palpitations No edema now--since med adjustment (HCTZ) No chest pain NO SOB or change in exercise tolerance Will have rare lightheadedness if gets overheated in exercise  No problems with the statin No myalgias No GI problems  History of prostate cancer Due for repeat PSA  Current Outpatient Prescriptions on File Prior to Visit  Medication Sig Dispense Refill  . aspirin 81 MG tablet Take 81 mg by mouth daily.        Marland Kitchen atorvastatin (LIPITOR) 10 MG tablet Take 1 tablet (10 mg total) by mouth daily.  90 tablet  3  . bisoprolol (ZEBETA) 5 MG tablet Take 0.5 tablets (2.5 mg total) by mouth daily.  90 tablet  3  . cholecalciferol (VITAMIN D) 1000 UNITS tablet Take 1,000 Units by mouth daily.        Marland Kitchen loratadine (CLARITIN) 10 MG tablet Take 10 mg by mouth as needed.       . Multiple Vitamins-Minerals (CENTRUM SILVER PO) Take 1 tablet by mouth daily.        Marland Kitchen triamterene-hydrochlorothiazide (MAXZIDE-25) 37.5-25 MG per tablet Take 1 each (1 tablet total) by mouth daily.  90 tablet  3   No current facility-administered medications on file prior to visit.    No Known Allergies  Past Medical History  Diagnosis Date  . Renal insufficiency   . Adenocarcinoma     Prostate  . Grave's disease   . Hypertension   . Hyperlipidemia   . Allergic rhinitis   . sinus node dysfunction     Dr. Graciela Husbands  . Pacemaker - BIOTRONIK     Past Surgical History  Procedure Laterality Date  . Tonsillectomy  1947    adenoids  . Prostate cancer  2007    RT  .  Pacemaker insertion  8/06    Bradycardia/ syncope  . Cataract extraction w/ intraocular lens  implant, bilateral  2012    Family History  Problem Relation Age of Onset  . Cancer Mother   . Diabetes Mother   . Coronary artery disease Father     cabg  . Hypertension Neg Hx   . Prostate cancer Neg Hx   . Colon cancer Neg Hx     History   Social History  . Marital Status: Married    Spouse Name: N/A    Number of Children: 2  . Years of Education: N/A   Occupational History  . 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 Smoking status: Never Smoker   . Smokeless tobacco: Never Used  . Alcohol Use: No     Comment: Occasional  . Drug Use: No  . Sexual Activity: Not on file   Other Topics Concern  . Not on file   Social History Narrative   Has a living will.    Wife to make health care decisions (then daughter Marjean Donna)   Would accept resuscitation   Not sure about feeding tube but no "unusual attempts"  Review of Systems No bowel problems Voids okay Stable nocturia x 1 Sleeps fine Appetite is fine Weight is stable    Objective:   Physical Exam  Constitutional: He is oriented to person, place, and time. He appears well-developed and well-nourished. No distress.  Neck: Normal range of motion. Neck supple. No thyromegaly present.  Cardiovascular: Normal rate, regular rhythm, normal heart sounds and intact distal pulses.  Exam reveals no gallop.   No murmur heard. Pulmonary/Chest: Effort normal and breath sounds normal. No respiratory distress. He has no wheezes. He has no rales.  Abdominal: Soft. There is no tenderness.  Musculoskeletal: He exhibits no edema and no tenderness.  Lymphadenopathy:    He has no cervical adenopathy.  Neurological: He is alert and oriented to person, place, and time.  President-- "Obama, Bush, Clinton" [phone] [id number] Recall 2/3  Skin: No rash noted.  Psychiatric: He has a  normal mood and affect. His behavior is normal.          Assessment & Plan:

## 2013-04-11 NOTE — Assessment & Plan Note (Signed)
I have personally reviewed the Medicare Annual Wellness questionnaire and have noted 1. The patient's medical and social history 2. Their use of alcohol, tobacco or illicit drugs 3. Their current medications and supplements 4. The patient's functional ability including ADL's, fall risks, home safety risks and hearing or visual             impairment. 5. Diet and physical activities 6. Evidence for depression or mood disorders  The patients weight, height, BMI and visual acuity have been recorded in the chart I have made referrals, counseling and provided education to the patient based review of the above and I have provided the pt with a written personalized care plan for preventive services.  I have provided you with a copy of your personalized plan for preventive services. Please take the time to review along with your updated medication list.  UTD on imms Due for his PSA

## 2013-04-11 NOTE — Assessment & Plan Note (Signed)
Will check PSA 

## 2013-04-11 NOTE — Progress Notes (Signed)
Pre-visit discussion using our clinic review tool. No additional management support is needed unless otherwise documented below in the visit note.  

## 2013-04-11 NOTE — Assessment & Plan Note (Signed)
BP Readings from Last 3 Encounters:  04/11/13 114/60  03/25/13 138/84  06/29/12 120/70   Good control

## 2013-04-12 LAB — LIPID PANEL
Cholesterol: 145 mg/dL (ref 0–200)
HDL: 34 mg/dL — ABNORMAL LOW (ref >39.00)
LDL Cholesterol: 72 mg/dL (ref 0–99)
Triglycerides: 197 mg/dL — ABNORMAL HIGH (ref 0.0–149.0)

## 2013-04-12 LAB — BASIC METABOLIC PANEL
Calcium: 9 mg/dL (ref 8.4–10.5)
GFR: 42.98 mL/min — ABNORMAL LOW (ref >60.00)
Glucose, Bld: 129 mg/dL — ABNORMAL HIGH (ref 70–99)
Potassium: 3.4 mEq/L — ABNORMAL LOW (ref 3.5–5.1)
Sodium: 131 mEq/L — ABNORMAL LOW (ref 135–145)

## 2013-04-12 LAB — CBC WITH DIFFERENTIAL/PLATELET
Basophils Absolute: 0.1 10*3/uL (ref 0.0–0.1)
Basophils Relative: 0.6 % (ref 0.0–3.0)
Eosinophils Relative: 0.5 % (ref 0.0–5.0)
Hemoglobin: 13.6 g/dL (ref 13.0–17.0)
Lymphocytes Relative: 15.4 % (ref 12.0–46.0)
Lymphs Abs: 1.5 10*3/uL (ref 0.7–4.0)
Monocytes Relative: 9.4 % (ref 3.0–12.0)
Neutro Abs: 7 10*3/uL (ref 1.4–7.7)
Platelets: 232 10*3/uL (ref 150.0–400.0)
RDW: 12.6 % (ref 11.5–14.6)
WBC: 9.4 10*3/uL (ref 4.5–10.5)

## 2013-04-12 LAB — HEPATIC FUNCTION PANEL
ALT: 20 U/L (ref 0–53)
AST: 20 U/L (ref 0–37)
Albumin: 4.5 g/dL (ref 3.5–5.2)
Alkaline Phosphatase: 87 U/L (ref 39–117)
Bilirubin, Direct: 0.2 mg/dL (ref 0.0–0.3)

## 2013-04-12 LAB — TSH: TSH: 1.85 u[IU]/mL (ref 0.35–5.50)

## 2013-04-18 ENCOUNTER — Encounter (HOSPITAL_COMMUNITY): Payer: Self-pay | Admitting: Respiratory Therapy

## 2013-04-18 ENCOUNTER — Ambulatory Visit (INDEPENDENT_AMBULATORY_CARE_PROVIDER_SITE_OTHER): Payer: Medicare Other | Admitting: *Deleted

## 2013-04-18 DIAGNOSIS — Z45018 Encounter for adjustment and management of other part of cardiac pacemaker: Secondary | ICD-10-CM

## 2013-04-18 DIAGNOSIS — Z45010 Encounter for checking and testing of cardiac pacemaker pulse generator [battery]: Secondary | ICD-10-CM

## 2013-04-18 LAB — MDC_IDC_ENUM_SESS_TYPE_INCLINIC: Implantable Pulse Generator Serial Number: 75799178

## 2013-04-18 NOTE — Progress Notes (Signed)
Battery check only. Calculated time until ERI 87mo. ROV 04/25/13 for pre gen change lab work. Tentative gen change 05/02/13.

## 2013-04-25 ENCOUNTER — Other Ambulatory Visit: Payer: Self-pay | Admitting: Internal Medicine

## 2013-04-25 ENCOUNTER — Ambulatory Visit (INDEPENDENT_AMBULATORY_CARE_PROVIDER_SITE_OTHER): Payer: Medicare Other | Admitting: *Deleted

## 2013-04-25 DIAGNOSIS — R7309 Other abnormal glucose: Secondary | ICD-10-CM | POA: Diagnosis not present

## 2013-04-25 DIAGNOSIS — I495 Sick sinus syndrome: Secondary | ICD-10-CM

## 2013-04-26 LAB — BASIC METABOLIC PANEL
BUN: 17 mg/dL (ref 8–27)
CO2: 26 mmol/L (ref 18–29)
Calcium: 9.1 mg/dL (ref 8.6–10.2)
Creatinine, Ser: 1.68 mg/dL — ABNORMAL HIGH (ref 0.76–1.27)
GFR calc non Af Amer: 39 mL/min/{1.73_m2} — ABNORMAL LOW (ref >59)
Potassium: 4.3 mmol/L (ref 3.5–5.2)

## 2013-04-26 LAB — CBC WITH DIFFERENTIAL/PLATELET
Basophils Absolute: 0 10*3/uL (ref 0.0–0.2)
Eos: 1 %
Eosinophils Absolute: 0.1 10*3/uL (ref 0.0–0.4)
HCT: 37.7 % (ref 37.5–51.0)
Hemoglobin: 13.2 g/dL (ref 12.6–17.7)
Lymphocytes Absolute: 1.4 10*3/uL (ref 0.7–3.1)
MCH: 32.4 pg (ref 26.6–33.0)
MCHC: 35 g/dL (ref 31.5–35.7)
MCV: 93 fL (ref 79–97)
Monocytes Absolute: 0.7 10*3/uL (ref 0.1–0.9)
Neutrophils Absolute: 3.4 10*3/uL (ref 1.4–7.0)
RBC: 4.07 x10E6/uL — ABNORMAL LOW (ref 4.14–5.80)

## 2013-05-01 DIAGNOSIS — Z45018 Encounter for adjustment and management of other part of cardiac pacemaker: Secondary | ICD-10-CM | POA: Diagnosis not present

## 2013-05-01 DIAGNOSIS — I10 Essential (primary) hypertension: Secondary | ICD-10-CM | POA: Diagnosis not present

## 2013-05-01 DIAGNOSIS — E0500 Thyrotoxicosis with diffuse goiter without thyrotoxic crisis or storm: Secondary | ICD-10-CM | POA: Diagnosis not present

## 2013-05-01 DIAGNOSIS — N289 Disorder of kidney and ureter, unspecified: Secondary | ICD-10-CM | POA: Diagnosis not present

## 2013-05-01 DIAGNOSIS — E785 Hyperlipidemia, unspecified: Secondary | ICD-10-CM | POA: Diagnosis not present

## 2013-05-01 DIAGNOSIS — I495 Sick sinus syndrome: Secondary | ICD-10-CM | POA: Diagnosis not present

## 2013-05-01 MED ORDER — CHLORHEXIDINE GLUCONATE 4 % EX LIQD
60.0000 mL | Freq: Once | CUTANEOUS | Status: DC
  Filled 2013-05-01: qty 60

## 2013-05-01 MED ORDER — SODIUM CHLORIDE 0.9 % IR SOLN
80.0000 mg | Status: AC
  Filled 2013-05-01: qty 2

## 2013-05-01 MED ORDER — SODIUM CHLORIDE 0.9 % IV SOLN
INTRAVENOUS | Status: DC
  Administered 2013-05-02: 13:00:00 via INTRAVENOUS

## 2013-05-01 MED ORDER — CEFAZOLIN SODIUM-DEXTROSE 2-3 GM-% IV SOLR
2.0000 g | INTRAVENOUS | Status: AC
  Filled 2013-05-01: qty 50

## 2013-05-02 ENCOUNTER — Encounter (HOSPITAL_COMMUNITY)
Admission: RE | Disposition: A | Discharge status: Home / Self Care | Payer: Self-pay | Source: Ambulatory Visit | Attending: Internal Medicine

## 2013-05-02 ENCOUNTER — Ambulatory Visit (HOSPITAL_COMMUNITY)
Admission: RE | Admit: 2013-05-02 | Discharge: 2013-05-02 | Disposition: A | Discharge status: Home / Self Care | Payer: Medicare Other | Source: Ambulatory Visit | Attending: Internal Medicine | Admitting: Internal Medicine

## 2013-05-02 ENCOUNTER — Encounter: Payer: Self-pay | Admitting: Internal Medicine

## 2013-05-02 DIAGNOSIS — I10 Essential (primary) hypertension: Secondary | ICD-10-CM | POA: Diagnosis not present

## 2013-05-02 DIAGNOSIS — E785 Hyperlipidemia, unspecified: Secondary | ICD-10-CM | POA: Insufficient documentation

## 2013-05-02 DIAGNOSIS — I495 Sick sinus syndrome: Secondary | ICD-10-CM | POA: Insufficient documentation

## 2013-05-02 DIAGNOSIS — E0500 Thyrotoxicosis with diffuse goiter without thyrotoxic crisis or storm: Secondary | ICD-10-CM | POA: Insufficient documentation

## 2013-05-02 DIAGNOSIS — Z45018 Encounter for adjustment and management of other part of cardiac pacemaker: Secondary | ICD-10-CM | POA: Diagnosis not present

## 2013-05-02 DIAGNOSIS — N289 Disorder of kidney and ureter, unspecified: Secondary | ICD-10-CM | POA: Insufficient documentation

## 2013-05-02 HISTORY — PX: PACEMAKER GENERATOR CHANGE: SHX5481

## 2013-05-02 LAB — SURGICAL PCR SCREEN
MRSA, PCR: NEGATIVE
Staphylococcus aureus: NEGATIVE

## 2013-05-02 LAB — PLATELET COUNT: Platelets: 236 10*3/uL (ref 150–400)

## 2013-05-02 SURGERY — PACEMAKER GENERATOR CHANGE
Anesthesia: LOCAL

## 2013-05-02 MED ORDER — ONDANSETRON HCL 4 MG/2ML IJ SOLN: 4.0000 mg | Freq: Four times a day (QID) | INTRAMUSCULAR | Status: DC | PRN

## 2013-05-02 MED ORDER — MIDAZOLAM HCL 5 MG/5ML IJ SOLN
INTRAMUSCULAR | Status: AC
  Filled 2013-05-02: qty 5

## 2013-05-02 MED ORDER — LIDOCAINE HCL (PF) 1 % IJ SOLN
INTRAMUSCULAR | Status: AC
  Filled 2013-05-02: qty 30

## 2013-05-02 MED ORDER — ACETAMINOPHEN 325 MG PO TABS: 325.0000 mg | ORAL_TABLET | ORAL | Status: DC | PRN

## 2013-05-02 MED ORDER — MUPIROCIN 2 % EX OINT
TOPICAL_OINTMENT | CUTANEOUS | Status: AC
  Administered 2013-05-02: 1 via NASAL
  Filled 2013-05-02: qty 22

## 2013-05-02 MED ORDER — SODIUM CHLORIDE 0.9 % IV SOLN: INTRAVENOUS | Status: DC

## 2013-05-02 MED ORDER — MUPIROCIN 2 % EX OINT
TOPICAL_OINTMENT | Freq: Two times a day (BID) | CUTANEOUS | Status: DC
  Administered 2013-05-02: 1 via NASAL
  Filled 2013-05-02: qty 22

## 2013-05-02 MED ORDER — FENTANYL CITRATE 0.05 MG/ML IJ SOLN
INTRAMUSCULAR | Status: AC
  Filled 2013-05-02: qty 2

## 2013-05-02 NOTE — H&P (Signed)
       Patient Care Team: Karie Schwalbe, MD as PCP - General   HPI  Jason Lambert is a 78 y.o. male Seen for syncope and history of sinus node dysfunction for which he underwent Biotronik pacemaker implantation a number of years ago in [place]. This was in the context of abnormal tilt and has continued to have typical episodes despite standard pacing The patient denies chest pain, shortness of breath, nocturnal dyspnea, orthopnea or peripheral edema. T   Past Medical History  Diagnosis Date  . Renal insufficiency   . Adenocarcinoma     Prostate  . Grave's disease   . Hypertension   . Hyperlipidemia   . Allergic rhinitis   . sinus node dysfunction     Dr. Graciela Husbands  . Pacemaker - BIOTRONIK     Past Surgical History  Procedure Laterality Date  . Tonsillectomy  1947    adenoids  . Prostate cancer  2007    RT  . Pacemaker insertion  8/06    Bradycardia/ syncope  . Cataract extraction w/ intraocular lens  implant, bilateral  2012    Current Facility-Administered Medications  Medication Dose Route Frequency Provider Last Rate Last Dose  . 0.9 %  sodium chloride infusion   Intravenous Continuous Duke Salvia, MD      . ceFAZolin (ANCEF) IVPB 2 g/50 mL premix  2 g Intravenous On Call Duke Salvia, MD      . chlorhexidine (HIBICLENS) 4 % liquid 4 application  60 mL Topical Once Duke Salvia, MD      . gentamicin (GARAMYCIN) 80 mg in sodium chloride irrigation 0.9 % 500 mL irrigation  80 mg Irrigation On Call Duke Salvia, MD      . mupirocin ointment Idelle Jo) 2 %   Nasal BID Duke Salvia, MD      . mupirocin ointment (BACTROBAN) 2 %             No Known Allergies  Review of Systems negative except from HPI and PMH  Physical Exam BP 151/67  Pulse 67  Temp(Src) 97.5 F (36.4 C) (Oral)  Resp 18  Ht 5\' 4"  (1.626 m)  Wt 150 lb (68.04 kg)  BMI 25.73 kg/m2  SpO2 100% Well developed and well nourished in no acute distress HENT normal E scleral and  icterus clear Neck Supple JVP flat; carotids brisk and full Clear to ausculation  Regular rate and rhythm, no murmurs gallops or rub Soft with active bowel sounds No clubbing cyanosis none Edema Alert and oriented, grossly normal motor and sensory function Skin Warm and Dry  Device pocket well healed; without hematoma or erythema.  There is no tethering   Assessment and  Plan  syncoep with sinus node dysfunction--prob neurally mediated  S/p pacer now at [facility]  Will proceed with CLS pacer generator replacement  We have reviewed the benefits and risks of generator replacement.  These include but are not limited to lead fracture and infection.  The patient understands, agrees and is willing to proceed.

## 2013-05-02 NOTE — CV Procedure (Signed)
Jason Lambert [id number]  [id number]  Preop [id number] nide dysfunction syncope prev pacer at [facility]  Postop Dx same/   Procedure: pacer generator replacement  Cx: None for pt, needle stick for me  *  Following informed consent the patient was brought to the electrophysiology laboratory in place of the fluoroscopic table in the supine position after routine prep and drape lidocaine was infiltrated in the region of the previous incision and carried down to later the device pocket using sharp dissection and electrocautery. The pocket was opened the device was freed up and was explanted.  Interrogation of the previously implanted ventricular lead    demonstrated an R wave of 17.9  millivolts., and impedance of 931 ohms, and a pacing threshold of 0.4 volts at 0.4 msec.    The previously implanted atrial lead  demonstrated a P-wave amplitude of 5.1 milllivolts  and impedance of  551 ohms, and a pacing threshold of 0.9 volts at @ 0.63milliseconds.  The leads were inspected. The leads were then attached to a biotronik pulse generator, serial number [id number].    The pocket was irrigated with antibiotic containing saline solution hemostasis was assured and the leads and the device were placed in the pocket. The wound was then closed in 2 layers in normal fashion.  The patient tolerated the procedure without apparent complication.     Sherryl Manges, MD 05/02/2013 4:14 PM

## 2013-05-03 ENCOUNTER — Encounter (HOSPITAL_COMMUNITY): Payer: Self-pay | Admitting: *Deleted

## 2013-05-11 ENCOUNTER — Ambulatory Visit (INDEPENDENT_AMBULATORY_CARE_PROVIDER_SITE_OTHER): Payer: Medicare Other | Admitting: *Deleted

## 2013-05-11 DIAGNOSIS — I495 Sick sinus syndrome: Secondary | ICD-10-CM | POA: Diagnosis not present

## 2013-05-11 LAB — MDC_IDC_ENUM_SESS_TYPE_INCLINIC
Battery Remaining Longevity: 12
Implantable Pulse Generator Model: 359529
Lead Channel Impedance Value: 331 Ohm
Lead Channel Impedance Value: 916 Ohm
Lead Channel Pacing Threshold Amplitude: 0.4 V
Lead Channel Pacing Threshold Amplitude: 0.8 V
Lead Channel Pacing Threshold Pulse Width: 0.4 ms
Lead Channel Sensing Intrinsic Amplitude: 4.8 mV
Lead Channel Setting Pacing Amplitude: 0.9 V
Lead Channel Setting Pacing Amplitude: 1.8 V
Lead Channel Setting Pacing Pulse Width: 0.4 ms
MDC IDC MSMT LEADCHNL RA IMPEDANCE VALUE: 448 Ohm
MDC IDC MSMT LEADCHNL RA PACING THRESHOLD PULSEWIDTH: 0.4 ms
MDC IDC MSMT LEADCHNL RV IMPEDANCE VALUE: 858 Ohm
MDC IDC MSMT LEADCHNL RV SENSING INTR AMPL: 14.7 mV
MDC IDC PG SERIAL: 68145068

## 2013-05-26 NOTE — Progress Notes (Signed)
Wound check appointment. Steri-strips removed. Wound without redness or edema. Incision edges approximated, wound well healed. Normal device function. Thresholds, sensing, and impedances consistent with implant measurements. Device programmed at appropriate safety margins. Histogram distribution appropriate for patient and level of activity. No mode switches or high ventricular rates noted. Patient educated about wound care, arm mobility, lifting restrictions. ROV in 3 months with SK.

## 2013-06-06 ENCOUNTER — Encounter: Payer: Self-pay | Admitting: Internal Medicine

## 2013-08-09 ENCOUNTER — Encounter: Payer: Self-pay | Admitting: Internal Medicine

## 2013-08-09 ENCOUNTER — Ambulatory Visit (INDEPENDENT_AMBULATORY_CARE_PROVIDER_SITE_OTHER): Payer: Medicare Other | Admitting: Internal Medicine

## 2013-08-09 VITALS — BP 122/72 | HR 70 | Ht 64.0 in | Wt 154.8 lb

## 2013-08-09 DIAGNOSIS — I10 Essential (primary) hypertension: Secondary | ICD-10-CM | POA: Diagnosis not present

## 2013-08-09 DIAGNOSIS — I495 Sick sinus syndrome: Secondary | ICD-10-CM | POA: Diagnosis not present

## 2013-08-09 LAB — MDC_IDC_ENUM_SESS_TYPE_INCLINIC
Battery Remaining Longevity: 131 mo
Brady Statistic RV Percent Paced: 0 %
Implantable Pulse Generator Model: 359529
Implantable Pulse Generator Serial Number: 68145068
Lead Channel Impedance Value: 409 Ohm
Lead Channel Impedance Value: 877 Ohm
Lead Channel Pacing Threshold Amplitude: 0.8 V
Lead Channel Pacing Threshold Pulse Width: 0.4 ms
Lead Channel Sensing Intrinsic Amplitude: 14.4 mV
Lead Channel Sensing Intrinsic Amplitude: 5.2 mV
Lead Channel Setting Pacing Amplitude: 0.9 V
Lead Channel Setting Pacing Amplitude: 1.8 V
MDC IDC MSMT LEADCHNL RA PACING THRESHOLD PULSEWIDTH: 0.4 ms
MDC IDC MSMT LEADCHNL RV PACING THRESHOLD AMPLITUDE: 0.3 V
MDC IDC SET LEADCHNL RV PACING PULSEWIDTH: 0.4 ms
MDC IDC STAT BRADY RA PERCENT PACED: 99 %

## 2013-08-09 NOTE — Progress Notes (Signed)
      Patient Care Team: Venia Carbon, MD as PCP - General   HPI  Jason Lambert is a 79 y.o. male Seen in followup for pacemaker implanted for sinus node dysfunction and syncope. He underwent device generator replacement 12/14.  The patient denies chest pain, shortness of breath, nocturnal dyspnea, orthopnea or peripheral edema.  There have been no palpitations, lightheadedness or syncope.    Past Medical History  Diagnosis Date  . Renal insufficiency   . Adenocarcinoma     Prostate  . Grave's disease   . Hypertension   . Hyperlipidemia   . Allergic rhinitis   . sinus node dysfunction     s/p BTK pacemaker    Past Surgical History  Procedure Laterality Date  . Tonsillectomy  1947    adenoids  . Prostate cancer  2007    RT  . Pacemaker insertion  8/06; 05/02/2013    Bradycardia/ syncope; BTK with generator change 05/02/2013 by Dr Caryl Comes  . Cataract extraction w/ intraocular lens  implant, bilateral  2012    Current Outpatient Prescriptions  Medication Sig Dispense Refill  . aspirin 81 MG tablet Take 81 mg by mouth daily.        Marland Kitchen atorvastatin (LIPITOR) 10 MG tablet Take 1 tablet (10 mg total) by mouth daily.  90 tablet  3  . bisoprolol (ZEBETA) 5 MG tablet Take 0.5 tablets (2.5 mg total) by mouth daily.  90 tablet  3  . cholecalciferol (VITAMIN D) 1000 UNITS tablet Take 1,000 Units by mouth daily.        Marland Kitchen loratadine (CLARITIN) 10 MG tablet Take 10 mg by mouth as needed for allergies.       . Multiple Vitamins-Minerals (CENTRUM SILVER PO) Take 1 tablet by mouth daily.        Marland Kitchen triamterene-hydrochlorothiazide (MAXZIDE-25) 37.5-25 MG per tablet Take 1 tablet by mouth daily.  90 tablet  3   No current facility-administered medications for this visit.    No Known Allergies  Review of Systems negative except from HPI and PMH  Physical Exam BP 122/72  Pulse 70  Ht 5\' 4"  (1.626 m)  Wt 154 lb 12 oz (70.194 kg)  BMI 26.55 kg/m2 Well developed and well  nourished in no acute distress HENT normal E scleral and icterus clear Neck Supple JVP flat; carotids brisk and full Clear to ausculation Device pocket well healed; without hematoma or erythema.  There is no tethering Regular rate and rhythm, no murmurs gallops or rub Soft with active bowel sounds No clubbing cyanosis  Edema Alert and oriented, grossly normal motor and sensory function Skin Warm and Dry    Assessment and  Plan  Sinus node dysfunction  100% a pacing  Pacemaker Biotoronik. The patient's device was interrogated.  The information was reviewed. No changes were made in the programming.

## 2013-08-09 NOTE — Patient Instructions (Addendum)
Your physician recommends that you schedule a follow-up appointment in: 9 months with Dr. Caryl Comes and a device check.

## 2013-09-13 ENCOUNTER — Other Ambulatory Visit: Payer: Self-pay | Admitting: *Deleted

## 2013-09-13 MED ORDER — BISOPROLOL FUMARATE 5 MG PO TABS: 2.5000 mg | ORAL_TABLET | Freq: Every day | ORAL | Status: DC

## 2013-09-15 NOTE — Telephone Encounter (Signed)
Pt said [facility] contacted pt and needs authorization to mail bisoprolol to pt; advised done on 09/13/13. Pt will contact [facility].

## 2013-11-03 ENCOUNTER — Telehealth: Payer: Self-pay | Admitting: Internal Medicine

## 2013-11-03 DIAGNOSIS — H04129 Dry eye syndrome of unspecified lacrimal gland: Secondary | ICD-10-CM | POA: Diagnosis not present

## 2013-11-03 NOTE — Telephone Encounter (Signed)
LMOVM informing pt that transmission is automatic.

## 2013-11-03 NOTE — Telephone Encounter (Signed)
New message  Pt called. Requests a call back to discuss his remote check appointment, Is is automatic or manual. Please call

## 2013-11-10 ENCOUNTER — Ambulatory Visit (INDEPENDENT_AMBULATORY_CARE_PROVIDER_SITE_OTHER): Payer: Medicare Other | Admitting: *Deleted

## 2013-11-10 DIAGNOSIS — I495 Sick sinus syndrome: Secondary | ICD-10-CM | POA: Diagnosis not present

## 2013-11-10 NOTE — Progress Notes (Signed)
Remote pacemaker transmission.   

## 2013-11-12 LAB — MDC_IDC_ENUM_SESS_TYPE_REMOTE
Brady Statistic RA Percent Paced: 99 %
Lead Channel Impedance Value: 449 Ohm
Lead Channel Impedance Value: 839 Ohm
Lead Channel Pacing Threshold Amplitude: 0.9 V
Lead Channel Sensing Intrinsic Amplitude: 4.3 mV
Lead Channel Setting Pacing Amplitude: 0.9 V
Lead Channel Setting Pacing Pulse Width: 0.4 ms
MDC IDC MSMT LEADCHNL RV PACING THRESHOLD AMPLITUDE: 0.3 V
MDC IDC MSMT LEADCHNL RV SENSING INTR AMPL: 14.2 mV
MDC IDC PG MODEL: 359529
MDC IDC PG SERIAL: 68145068
MDC IDC SET LEADCHNL RA PACING AMPLITUDE: 1.8 V
MDC IDC STAT BRADY RV PERCENT PACED: 1 %

## 2013-11-18 ENCOUNTER — Ambulatory Visit (INDEPENDENT_AMBULATORY_CARE_PROVIDER_SITE_OTHER): Payer: Medicare Other | Admitting: Internal Medicine

## 2013-11-18 ENCOUNTER — Encounter: Payer: Self-pay | Admitting: Internal Medicine

## 2013-11-18 VITALS — BP 122/78 | HR 66 | Temp 98.5°F | Wt 152.5 lb

## 2013-11-18 DIAGNOSIS — L989 Disorder of the skin and subcutaneous tissue, unspecified: Secondary | ICD-10-CM

## 2013-11-18 NOTE — Progress Notes (Signed)
Subjective:    Patient ID: Jason Lambert, male    DOB: 1934-11-06, 79 y.o.   MRN: [id number]  HPI Has spot on right proximal forearm Noticed it last week No known injury  No pain or itching Another similar spot on left leg--looks some different  Current Outpatient Prescriptions on File Prior to Visit  Medication Sig Dispense Refill  . aspirin 81 MG tablet Take 81 mg by mouth daily.        Marland Kitchen atorvastatin (LIPITOR) 10 MG tablet Take 1 tablet (10 mg total) by mouth daily.  90 tablet  3  . bisoprolol (ZEBETA) 5 MG tablet Take 0.5 tablets (2.5 mg total) by mouth daily.  90 tablet  3  . cholecalciferol (VITAMIN D) 1000 UNITS tablet Take 1,000 Units by mouth daily.        Marland Kitchen loratadine (CLARITIN) 10 MG tablet Take 10 mg by mouth as needed for allergies.       . Multiple Vitamins-Minerals (CENTRUM SILVER PO) Take 1 tablet by mouth daily.        Marland Kitchen triamterene-hydrochlorothiazide (MAXZIDE-25) 37.5-25 MG per tablet Take 1 tablet by mouth daily.  90 tablet  3   No current facility-administered medications on file prior to visit.    No Known Allergies  Past Medical History  Diagnosis Date  . Renal insufficiency   . Adenocarcinoma     Prostate  . Grave's disease   . Hypertension   . Hyperlipidemia   . Allergic rhinitis   . sinus node dysfunction     s/p BTK pacemaker    Past Surgical History  Procedure Laterality Date  . Tonsillectomy  1947    adenoids  . Prostate cancer  2007    RT  . Pacemaker insertion  8/06; 05/02/2013    Bradycardia/ syncope; BTK with generator change 05/02/2013 by Dr Caryl Comes  . Cataract extraction w/ intraocular lens  implant, bilateral  2012    Family History  Problem Relation Age of Onset  . Cancer Mother   . Diabetes Mother   . Coronary artery disease Father     cabg  . Hypertension Neg Hx   . Prostate cancer Neg Hx   . Colon cancer Neg Hx     History   Social History  . Marital Status: Married    Spouse Name: N/A    Number of Children:  2  . Years of Education: N/A   Occupational History  . Retired     Art gallery manager with [employer] History Main Topics  . Smoking status: Never Smoker   . Smokeless tobacco: Never Used  . Alcohol Use: No     Comment: Occasional  . Drug Use: No  . Sexual Activity: Not on file   Other Topics Concern  . Not on file   Social History Narrative   Has a living will.    Wife to make health care decisions (then daughter Dyann Ruddle)   Would accept resuscitation   Not sure about feeding tube but no "unusual attempts"            Review of Systems No fever Not sick No recent outdoor exposures    Objective:   Physical Exam  Constitutional: He appears well-developed and well-nourished. No distress.  Skin:  ~3cm oval slightly raised brownish lesion on left calf (probably early seb keratosis) Small scaly area on left hand  Inflamed ~32mm lesion on proximal left forearm No pus Not tender  Assessment & Plan:

## 2013-11-18 NOTE — Assessment & Plan Note (Signed)
Looks like inflamed spot--?insect bite Doubt actinic this quickly Other areas are clearly not concerning  Reassured Will just observe

## 2013-11-18 NOTE — Progress Notes (Signed)
Pre visit review using our clinic review tool, if applicable. No additional management support is needed unless otherwise documented below in the visit note. 

## 2013-12-07 ENCOUNTER — Encounter: Payer: Self-pay | Admitting: Cardiology

## 2013-12-12 ENCOUNTER — Encounter: Payer: Self-pay | Admitting: Internal Medicine

## 2014-02-13 ENCOUNTER — Ambulatory Visit (INDEPENDENT_AMBULATORY_CARE_PROVIDER_SITE_OTHER): Payer: Medicare Other | Admitting: *Deleted

## 2014-02-13 DIAGNOSIS — I495 Sick sinus syndrome: Secondary | ICD-10-CM | POA: Diagnosis not present

## 2014-02-13 NOTE — Progress Notes (Signed)
Remote pacemaker transmission.   

## 2014-02-21 LAB — MDC_IDC_ENUM_SESS_TYPE_REMOTE
Implantable Pulse Generator Model: 359529
Implantable Pulse Generator Serial Number: 68145068
Lead Channel Impedance Value: 429 Ohm
Lead Channel Impedance Value: 780 Ohm
Lead Channel Pacing Threshold Amplitude: 0.3 V
Lead Channel Pacing Threshold Pulse Width: 0.4 ms
Lead Channel Pacing Threshold Pulse Width: 0.4 ms
Lead Channel Sensing Intrinsic Amplitude: 13.3 mV
Lead Channel Setting Pacing Amplitude: 0.9 V
MDC IDC MSMT LEADCHNL RA PACING THRESHOLD AMPLITUDE: 0.9 V
MDC IDC MSMT LEADCHNL RA SENSING INTR AMPL: 4.9 mV
MDC IDC SET LEADCHNL RA PACING AMPLITUDE: 1.8 V
MDC IDC SET LEADCHNL RV PACING PULSEWIDTH: 0.4 ms
MDC IDC STAT BRADY RA PERCENT PACED: 100 %
MDC IDC STAT BRADY RV PERCENT PACED: 0 %

## 2014-02-23 DIAGNOSIS — Z23 Encounter for immunization: Secondary | ICD-10-CM | POA: Diagnosis not present

## 2014-03-08 ENCOUNTER — Encounter: Payer: Self-pay | Admitting: Cardiology

## 2014-03-21 ENCOUNTER — Encounter: Payer: Self-pay | Admitting: Internal Medicine

## 2014-04-12 ENCOUNTER — Encounter: Payer: Self-pay | Admitting: Internal Medicine

## 2014-04-12 ENCOUNTER — Ambulatory Visit (INDEPENDENT_AMBULATORY_CARE_PROVIDER_SITE_OTHER): Payer: Medicare Other | Admitting: Internal Medicine

## 2014-04-12 VITALS — BP 140/80 | HR 65 | Temp 98.4°F | Ht 65.0 in | Wt 152.0 lb

## 2014-04-12 DIAGNOSIS — N183 Chronic kidney disease, stage 3 unspecified: Secondary | ICD-10-CM

## 2014-04-12 DIAGNOSIS — Z8546 Personal history of malignant neoplasm of prostate: Secondary | ICD-10-CM

## 2014-04-12 DIAGNOSIS — Z0000 Encounter for general adult medical examination without abnormal findings: Secondary | ICD-10-CM | POA: Diagnosis not present

## 2014-04-12 DIAGNOSIS — E785 Hyperlipidemia, unspecified: Secondary | ICD-10-CM | POA: Diagnosis not present

## 2014-04-12 DIAGNOSIS — Z23 Encounter for immunization: Secondary | ICD-10-CM

## 2014-04-12 DIAGNOSIS — Z7189 Other specified counseling: Secondary | ICD-10-CM

## 2014-04-12 DIAGNOSIS — E0500 Thyrotoxicosis with diffuse goiter without thyrotoxic crisis or storm: Secondary | ICD-10-CM

## 2014-04-12 DIAGNOSIS — I10 Essential (primary) hypertension: Secondary | ICD-10-CM

## 2014-04-12 LAB — COMPREHENSIVE METABOLIC PANEL
ALBUMIN: 4.6 g/dL (ref 3.5–5.2)
ALT: 18 U/L (ref 0–53)
AST: 19 U/L (ref 0–37)
Alkaline Phosphatase: 90 U/L (ref 39–117)
BILIRUBIN TOTAL: 1.1 mg/dL (ref 0.2–1.2)
BUN: 18 mg/dL (ref 6–23)
CO2: 28 mEq/L (ref 19–32)
Calcium: 9.4 mg/dL (ref 8.4–10.5)
Chloride: 96 mEq/L (ref 96–112)
Creatinine, Ser: 1.4 mg/dL (ref 0.4–1.5)
GFR: 52.17 mL/min — ABNORMAL LOW (ref >60.00)
Glucose, Bld: 73 mg/dL (ref 70–99)
Potassium: 4.2 mEq/L (ref 3.5–5.1)
SODIUM: 133 meq/L — AB (ref 135–145)
TOTAL PROTEIN: 7.1 g/dL (ref 6.0–8.3)

## 2014-04-12 LAB — CBC WITH DIFFERENTIAL/PLATELET
Basophils Absolute: 0 10*3/uL (ref 0.0–0.1)
Basophils Relative: 0.3 % (ref 0.0–3.0)
EOS ABS: 0.1 10*3/uL (ref 0.0–0.7)
Eosinophils Relative: 0.8 % (ref 0.0–5.0)
HEMATOCRIT: 40.4 % (ref 39.0–52.0)
Hemoglobin: 13.8 g/dL (ref 13.0–17.0)
Lymphocytes Relative: 19.4 % (ref 12.0–46.0)
Lymphs Abs: 1.5 10*3/uL (ref 0.7–4.0)
MCHC: 34.2 g/dL (ref 30.0–36.0)
MCV: 95.6 fl (ref 78.0–100.0)
MONO ABS: 1.2 10*3/uL — AB (ref 0.1–1.0)
Monocytes Relative: 15.4 % — ABNORMAL HIGH (ref 3.0–12.0)
NEUTROS PCT: 64.1 % (ref 43.0–77.0)
Neutro Abs: 4.9 10*3/uL (ref 1.4–7.7)
Platelets: 275 10*3/uL (ref 150.0–400.0)
RBC: 4.23 Mil/uL (ref 4.22–5.81)
RDW: 13 % (ref 11.5–15.5)
WBC: 7.6 10*3/uL (ref 4.0–10.5)

## 2014-04-12 LAB — PSA: PSA: 0.05 ng/mL — AB (ref 0.10–4.00)

## 2014-04-12 LAB — LIPID PANEL
CHOL/HDL RATIO: 3
Cholesterol: 154 mg/dL (ref 0–200)
HDL: 46.3 mg/dL (ref >39.00)
LDL CALC: 80 mg/dL (ref 0–99)
NONHDL: 107.7
Triglycerides: 139 mg/dL (ref 0.0–149.0)
VLDL: 27.8 mg/dL (ref 0.0–40.0)

## 2014-04-12 LAB — T4, FREE: Free T4: 0.87 ng/dL (ref 0.60–1.60)

## 2014-04-12 LAB — TSH: TSH: 2.41 u[IU]/mL (ref 0.35–4.50)

## 2014-04-12 MED ORDER — ATORVASTATIN CALCIUM 10 MG PO TABS: 10.0000 mg | ORAL_TABLET | Freq: Every day | ORAL | Status: DC

## 2014-04-12 MED ORDER — METOPROLOL SUCCINATE ER 50 MG PO TB24: 50.0000 mg | ORAL_TABLET | Freq: Every day | ORAL | Status: DC

## 2014-04-12 MED ORDER — LOSARTAN POTASSIUM-HCTZ 50-12.5 MG PO TABS: 1.0000 | ORAL_TABLET | Freq: Every day | ORAL | Status: DC

## 2014-04-12 NOTE — Patient Instructions (Signed)
Please stop the metoprolol and HCTZ/triamterene and substitute the 2 new medication. Please check your blood pressure once a month or so and let me know if it is over 150/90 (and let me know if you have dizziness, etc) Please set up blood work in about 6 weeks--- renal (N18.3)

## 2014-04-12 NOTE — Assessment & Plan Note (Signed)
See social history 

## 2014-04-12 NOTE — Assessment & Plan Note (Signed)
He continues to be comfortable with primary prevention Will check labs

## 2014-04-12 NOTE — Progress Notes (Signed)
Subjective:    Patient ID: Jason Lambert, male    DOB: 05/25/1934, 79 y.o.   MRN: [id number]  HPI Here for Medicare wellness and review of multiple chronic medical conditions Reviewed form and advanced directives Did have pacemaker replaced Reviewed other physicians No falls No depression or anhedonia Exercises regularly Occasional glass of wine No tobacco Independent with instrumental ADLs No striking cognitive changes  New pacemaker generator Working fine No post op problems No chest pain No SOB No dizziness or syncope No edema Bisoprolol is too expensive--wants to change to another beta blocker  Reviewed CRF Not sure why he is not on ACEI/ARB No recent BP checks Discussed monitoring it with changes we plan Does have occasional exercise hypotension (instead of going up)--no syncope though  No tremors No jitteriness No evidence of Grave's recurrence Took PTU last in 1980's  Doesn't see urologist Due for PSA check  Current Outpatient Prescriptions on File Prior to Visit  Medication Sig Dispense Refill  . aspirin 81 MG tablet Take 81 mg by mouth daily.      Marland Kitchen atorvastatin (LIPITOR) 10 MG tablet Take 1 tablet (10 mg total) by mouth daily. 90 tablet 3  . bisoprolol (ZEBETA) 5 MG tablet Take 0.5 tablets (2.5 mg total) by mouth daily. 90 tablet 3  . cholecalciferol (VITAMIN D) 1000 UNITS tablet Take 1,000 Units by mouth daily.      Marland Kitchen loratadine (CLARITIN) 10 MG tablet Take 10 mg by mouth as needed for allergies.     . Multiple Vitamins-Minerals (CENTRUM SILVER PO) Take 1 tablet by mouth daily.      Marland Kitchen triamterene-hydrochlorothiazide (MAXZIDE-25) 37.5-25 MG per tablet Take 1 tablet by mouth daily. 90 tablet 3   No current facility-administered medications on file prior to visit.    No Known Allergies  Past Medical History  Diagnosis Date  . Renal insufficiency   . Adenocarcinoma     Prostate  . Grave's disease   . Hypertension   . Hyperlipidemia   .  Allergic rhinitis   . sinus node dysfunction     s/p BTK pacemaker    Past Surgical History  Procedure Laterality Date  . Tonsillectomy  1947    adenoids  . Prostate cancer  2007    RT  . Pacemaker insertion  8/06; 05/02/2013    Bradycardia/ syncope; BTK with generator change 05/02/2013 by Dr Caryl Comes  . Cataract extraction w/ intraocular lens  implant, bilateral  2012    Family History  Problem Relation Age of Onset  . Cancer Mother   . Diabetes Mother   . Coronary artery disease Father     cabg  . Hypertension Neg Hx   . Prostate cancer Neg Hx   . Colon cancer Neg Hx     History   Social History  . Marital Status: Married    Spouse Name: N/A    Number of Children: 2  . Years of Education: N/A   Occupational History  . 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 Smoking status: Never Smoker   . Smokeless tobacco: Never Used  . Alcohol Use: No     Comment: Occasional  . Drug Use: No  . Sexual Activity: Not on file   Other Topics Concern  . Not on file   Social History Narrative   Has a living will.    Wife to make health care decisions (then daughter [name])--formal health care  POA   Would accept resuscitation   Not sure about feeding tube but no "unusual attempts"            Review of Systems Skin lesion on arm did go away--no other worrisome lesions Sleeps well Appetite is good Weight is stable Bowels are fine Urine stream is weak at times--not bad. Nocturia usually x 2 --no change. No sig daytime problems    Objective:   Physical Exam  Constitutional: He is oriented to person, place, and time. He appears well-developed and well-nourished. No distress.  HENT:  Mouth/Throat: Oropharynx is clear and moist. No oropharyngeal exudate.  Neck: Normal range of motion. Neck supple. No thyromegaly present.  Cardiovascular: Normal rate, regular rhythm, normal heart sounds and intact distal pulses.  Exam reveals no gallop.   No  murmur heard. Pulmonary/Chest: Effort normal and breath sounds normal. No respiratory distress. He has no wheezes. He has no rales.  Abdominal: Soft. There is no tenderness.  Musculoskeletal: He exhibits no edema or tenderness.  Lymphadenopathy:    He has no cervical adenopathy.  Neurological: He is alert and oriented to person, place, and time.  President-- "Elyn Peers, [place], [place]" [phone] [id number] Recall 3/3  Skin: No rash noted. No erythema.  Psychiatric: He has a normal mood and affect. His behavior is normal.          Assessment & Plan:

## 2014-04-12 NOTE — Assessment & Plan Note (Signed)
This has been stable Discussed starting ARB for renal protection--will go ahead and do this

## 2014-04-12 NOTE — Progress Notes (Signed)
Pre visit review using our clinic review tool, if applicable. No additional management support is needed unless otherwise documented below in the visit note. 

## 2014-04-12 NOTE — Assessment & Plan Note (Signed)
In remission for 30 years Euthyroid clinically Will just check labs

## 2014-04-12 NOTE — Assessment & Plan Note (Signed)
BP Readings from Last 3 Encounters:  04/12/14 140/80  11/18/13 122/78  08/09/13 122/72   Will change to losartan Metoprolol succinate for insurance

## 2014-04-12 NOTE — Assessment & Plan Note (Signed)
I have personally reviewed the Medicare Annual Wellness questionnaire and have noted 1. The patient's medical and social history 2. Their use of alcohol, tobacco or illicit drugs 3. Their current medications and supplements 4. The patient's functional ability including ADL's, fall risks, home safety risks and hearing or visual             impairment. 5. Diet and physical activities 6. Evidence for depression or mood disorders  The patients weight, height, BMI and visual acuity have been recorded in the chart I have made referrals, counseling and provided education to the patient based review of the above and I have provided the pt with a written personalized care plan for preventive services.  I have provided you with a copy of your personalized plan for preventive services. Please take the time to review along with your updated medication list.  Due for prevnar and Td No colonoscopy due to age 79 in good shape

## 2014-04-12 NOTE — Assessment & Plan Note (Signed)
Will check PSA 

## 2014-04-12 NOTE — Addendum Note (Signed)
Addended by: Despina Hidden on: 04/12/2014 11:50 AM   Modules accepted: Orders

## 2014-04-13 ENCOUNTER — Encounter (HOSPITAL_COMMUNITY): Payer: Self-pay | Admitting: Internal Medicine

## 2014-05-17 ENCOUNTER — Telehealth: Payer: Self-pay | Admitting: Internal Medicine

## 2014-05-17 NOTE — Telephone Encounter (Signed)
New Msg          Pt states he is getting calls about remote check from home and he would like a call back.   Pt isn't sure if he has anything that he should do and is confused.

## 2014-05-18 ENCOUNTER — Ambulatory Visit (INDEPENDENT_AMBULATORY_CARE_PROVIDER_SITE_OTHER): Payer: Medicare Other | Admitting: *Deleted

## 2014-05-18 ENCOUNTER — Other Ambulatory Visit: Payer: Self-pay | Admitting: Internal Medicine

## 2014-05-18 DIAGNOSIS — I495 Sick sinus syndrome: Secondary | ICD-10-CM

## 2014-05-18 DIAGNOSIS — N183 Chronic kidney disease, stage 3 unspecified: Secondary | ICD-10-CM

## 2014-05-18 NOTE — Telephone Encounter (Signed)
Informed pt that his home monitor checks him every night. Pt is very upset b/c he gets phone tree phone calls and doesn't understand why he can not be excluded from these calls. I tried to explain but pt did not want to hear what I had to say.

## 2014-05-18 NOTE — Progress Notes (Signed)
Remote pacemaker transmission.   

## 2014-05-19 LAB — MDC_IDC_ENUM_SESS_TYPE_INCLINIC
Brady Statistic RA Percent Paced: 99 %
Brady Statistic RV Percent Paced: 0 %
Implantable Pulse Generator Serial Number: 68145068
Lead Channel Impedance Value: 449 Ohm
Lead Channel Pacing Threshold Amplitude: 0.3 V
Lead Channel Sensing Intrinsic Amplitude: 13.1 mV
Lead Channel Sensing Intrinsic Amplitude: 4 mV
Lead Channel Setting Pacing Amplitude: 1.8 V
Lead Channel Setting Pacing Pulse Width: 0.4 ms
MDC IDC MSMT LEADCHNL RA PACING THRESHOLD AMPLITUDE: 0.6 V
MDC IDC MSMT LEADCHNL RV IMPEDANCE VALUE: 936 Ohm
MDC IDC PG MODEL: 359529
MDC IDC SET LEADCHNL RV PACING AMPLITUDE: 0.9 V

## 2014-05-24 ENCOUNTER — Other Ambulatory Visit (INDEPENDENT_AMBULATORY_CARE_PROVIDER_SITE_OTHER): Payer: Medicare Other

## 2014-05-24 DIAGNOSIS — N183 Chronic kidney disease, stage 3 unspecified: Secondary | ICD-10-CM

## 2014-05-24 LAB — RENAL FUNCTION PANEL
Albumin: 4.1 g/dL (ref 3.5–5.2)
BUN: 24 mg/dL — ABNORMAL HIGH (ref 6–23)
CHLORIDE: 98 meq/L (ref 96–112)
CO2: 31 meq/L (ref 19–32)
Calcium: 9.3 mg/dL (ref 8.4–10.5)
Creatinine, Ser: 1.49 mg/dL (ref 0.40–1.50)
GFR: 48.54 mL/min — ABNORMAL LOW (ref >60.00)
Glucose, Bld: 123 mg/dL — ABNORMAL HIGH (ref 70–99)
POTASSIUM: 3.9 meq/L (ref 3.5–5.1)
Phosphorus: 3.8 mg/dL (ref 2.3–4.6)
SODIUM: 133 meq/L — AB (ref 135–145)

## 2014-06-27 ENCOUNTER — Encounter: Payer: Self-pay | Admitting: Internal Medicine

## 2014-06-29 ENCOUNTER — Telehealth: Payer: Self-pay

## 2014-06-29 NOTE — Telephone Encounter (Signed)
Pt left v/m; pt has been talking with Rose about acct and has additional question; pt requesting cb [phone].

## 2014-07-05 NOTE — Telephone Encounter (Signed)
Returned pt's call 07/04/2014, documented his account under account note.

## 2014-07-20 ENCOUNTER — Encounter: Payer: Self-pay | Admitting: Cardiology

## 2014-08-01 ENCOUNTER — Encounter: Payer: Self-pay | Admitting: Internal Medicine

## 2014-08-08 ENCOUNTER — Encounter: Payer: Self-pay | Admitting: Internal Medicine

## 2014-08-08 ENCOUNTER — Ambulatory Visit (INDEPENDENT_AMBULATORY_CARE_PROVIDER_SITE_OTHER): Payer: Medicare Other | Admitting: Internal Medicine

## 2014-08-08 VITALS — BP 130/60 | HR 69 | Ht 64.0 in | Wt 155.8 lb

## 2014-08-08 DIAGNOSIS — R202 Paresthesia of skin: Secondary | ICD-10-CM

## 2014-08-08 DIAGNOSIS — Z950 Presence of cardiac pacemaker: Secondary | ICD-10-CM

## 2014-08-08 DIAGNOSIS — I495 Sick sinus syndrome: Secondary | ICD-10-CM

## 2014-08-08 DIAGNOSIS — I10 Essential (primary) hypertension: Secondary | ICD-10-CM | POA: Diagnosis not present

## 2014-08-08 DIAGNOSIS — R200 Anesthesia of skin: Secondary | ICD-10-CM

## 2014-08-08 MED ORDER — APIXABAN 5 MG PO TABS: 5.0000 mg | ORAL_TABLET | Freq: Two times a day (BID) | ORAL | Status: DC

## 2014-08-08 NOTE — Progress Notes (Signed)
Patient Care Team: Venia Carbon, MD as PCP - General   HPI  Jason Lambert is a 80 y.o. male Seen in followup for pacemaker implanted for sinus node dysfunction and syncope. He underwent device generator replacement 12/14.  The patient denies chest pain, shortness of breath, nocturnal dyspnea, orthopnea or peripheral edema.  There have been no palpitations, lightheadedness or syncope.   He has 6-8 week hx of numbness on his left side, arm and leg, and heaviness of the left side of his tongue  Old records were reveiwed from the outside that identified pacer interrogation strips demonstrating attrial fibrillation  He is on ASA   Past Medical History  Diagnosis Date  . Renal insufficiency   . Adenocarcinoma     Prostate  . Grave's disease   . Hypertension   . Hyperlipidemia   . Allergic rhinitis   . sinus node dysfunction     s/p BTK pacemaker    Past Surgical History  Procedure Laterality Date  . Tonsillectomy  1947    adenoids  . Prostate cancer  2007    RT  . Pacemaker insertion  8/06; 05/02/2013    Bradycardia/ syncope; BTK with generator change 05/02/2013 by Dr Caryl Comes  . Cataract extraction w/ intraocular lens  implant, bilateral  2012  . Pacemaker generator change N/A 05/02/2013    Procedure: PACEMAKER GENERATOR CHANGE;  Surgeon: Deboraha Sprang, MD;  Location: [facility] CATH LAB;  Service: Cardiovascular;  Laterality: N/A;    Current Outpatient Prescriptions  Medication Sig Dispense Refill  . aspirin 81 MG tablet Take 81 mg by mouth daily.      Marland Kitchen atorvastatin (LIPITOR) 10 MG tablet Take 1 tablet (10 mg total) by mouth daily. 90 tablet 3  . loratadine (CLARITIN) 10 MG tablet Take 10 mg by mouth as needed for allergies.     Marland Kitchen losartan-hydrochlorothiazide (HYZAAR) 50-12.5 MG per tablet Take 1 tablet by mouth daily. 90 tablet 3  . metoprolol succinate (TOPROL-XL) 50 MG 24 hr tablet Take 1 tablet (50 mg total) by mouth daily. Take with or immediately following a  meal. 90 tablet 3  . Multiple Vitamins-Minerals (CENTRUM SILVER PO) Take 1 tablet by mouth daily.       No current facility-administered medications for this visit.    No Known Allergies  Review of Systems negative except from HPI and PMH  Physical Exam BP 130/60 mmHg  Pulse 69  Ht 5\' 4"  (1.626 m)  Wt 155 lb 12 oz (70.648 kg)  BMI 26.72 kg/m2 Well developed and well nourished in no acute distress HENT normal E scleral and icterus clear Neck Supple JVP flat; carotids brisk and full Clear to ausculation Device pocket well healed; without hematoma or erythema.  There is no tethering  Regular rate and rhythm, no murmurs gallops or rub S1 is diminished Soft with active bowel sounds No clubbing cyanosis  Edema Alert and oriented, grossly normal motor and sensory function Skin Warm and Dry  ECG today demonstrated sinus rhythm at 69 Intervals 24/10/38  Assessment and  Plan  Sinus node dysfunction  100% a pacing  Pacemaker Biotoronik. The patient's device was interrogated.  The information was reviewed. No changes were made in the programming.  Atrial fibrilllation  Neurocardiogenic syncope  Left body numbness   We will leave pacemaker program unchanged as he continues to have, albeit infrequent, episodes of vasomotor presyncope  His neurological symptoms are worrisome particularly in light of atrial fibrillation previously  identified  given this, we will discontinue his aspirin and begin him on apixoban   We have discyussed risks and benefits  In the meantime, we will undertake a CT scan-unenhanced to look to see if there is evidence of a stroke to explain his symptoms.  I have reviewed this with radiology and will forward this to his PCP  Functional status remains good

## 2014-08-08 NOTE — Patient Instructions (Signed)
Remote monitoring is used to monitor your Pacemaker of ICD from home. This monitoring reduces the number of office visits required to check your device to one time per year. It allows [place] to keep an eye on the functioning of your device to ensure it is working properly. You are scheduled for a device check from home on July 5th. You may send your transmission at any time that day. If you have a wireless device, the transmission will be sent automatically. After your physician reviews your transmission, you will receive a postcard with your next transmission date.   Your physician wants you to follow-up in: 6 months with Dr. Caryl Comes.  You will receive a reminder letter in the mail two months in advance. If you don't receive a letter, please call our office to schedule the follow-up appointment.   Your physician has recommended you make the following change in your medication: Start Eliquis ( 5 mg ) twice daily.   Non-Cardiac CT scanning, (CAT scanning), is a noninvasive, special x-ray that produces cross-sectional images of the body using x-rays and a computer. CT scans help physicians diagnose and treat medical conditions. For some CT exams, a contrast material is used to enhance visibility in the area of the body being studied. CT scans provide greater clarity and reveal more details than regular x-ray exams. Tomorrow At 10: 45 am at [place], next to [employer]. Bring a list of medications.  Patient was given samples of Eliquis ( 5 mg ) twice daily.  [facility]

## 2014-08-09 ENCOUNTER — Ambulatory Visit
Admit: 2014-08-09 | Disposition: A | Discharge status: Home / Self Care | Payer: Self-pay | Attending: Internal Medicine | Admitting: Internal Medicine

## 2014-08-09 ENCOUNTER — Other Ambulatory Visit: Payer: Self-pay

## 2014-08-09 DIAGNOSIS — R200 Anesthesia of skin: Secondary | ICD-10-CM | POA: Diagnosis not present

## 2014-08-09 DIAGNOSIS — R531 Weakness: Secondary | ICD-10-CM | POA: Diagnosis not present

## 2014-08-09 DIAGNOSIS — R202 Paresthesia of skin: Secondary | ICD-10-CM | POA: Diagnosis not present

## 2014-08-09 LAB — MDC_IDC_ENUM_SESS_TYPE_INCLINIC
Battery Remaining Percentage: 90 %
Brady Statistic RA Percent Paced: 99 %
Brady Statistic RV Percent Paced: 1 %
Implantable Pulse Generator Model: 359529
Lead Channel Impedance Value: 448 Ohm
Lead Channel Impedance Value: 897 Ohm
Lead Channel Pacing Threshold Pulse Width: 0.4 ms
Lead Channel Sensing Intrinsic Amplitude: 4.5 mV
Lead Channel Setting Pacing Amplitude: 1.8 V
MDC IDC MSMT LEADCHNL RA PACING THRESHOLD AMPLITUDE: 0.7 V
MDC IDC MSMT LEADCHNL RA PACING THRESHOLD PULSEWIDTH: 0.4 ms
MDC IDC MSMT LEADCHNL RV PACING THRESHOLD AMPLITUDE: 0.3 V
MDC IDC MSMT LEADCHNL RV SENSING INTR AMPL: 14.3 mV
MDC IDC PG SERIAL: 68145068
MDC IDC SET LEADCHNL RV PACING AMPLITUDE: 0.9 V
MDC IDC SET LEADCHNL RV PACING PULSEWIDTH: 0.4 ms

## 2014-08-11 ENCOUNTER — Encounter: Payer: Self-pay | Admitting: Internal Medicine

## 2014-08-30 ENCOUNTER — Ambulatory Visit (INDEPENDENT_AMBULATORY_CARE_PROVIDER_SITE_OTHER): Payer: Medicare Other | Admitting: Internal Medicine

## 2014-08-30 ENCOUNTER — Encounter: Payer: Self-pay | Admitting: Internal Medicine

## 2014-08-30 VITALS — BP 122/70 | HR 72 | Temp 97.9°F | Wt 156.0 lb

## 2014-08-30 DIAGNOSIS — L821 Other seborrheic keratosis: Secondary | ICD-10-CM | POA: Diagnosis not present

## 2014-08-30 NOTE — Progress Notes (Signed)
Pre visit review using our clinic review tool, if applicable. No additional management support is needed unless otherwise documented below in the visit note. 
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
## 2014-08-30 NOTE — Progress Notes (Signed)
Subjective:    Patient ID: Jason Lambert, male    DOB: February 24, 1935, 80 y.o.   MRN: [id number]  HPI Here due to a lesion on back Wife was concerned It does itch him some  Not sure if it has changed recently--the itch has gone back a long time Not inflamed No discharge  Current Outpatient Prescriptions on File Prior to Visit  Medication Sig Dispense Refill  . apixaban (ELIQUIS) 5 MG TABS tablet Take 1 tablet (5 mg total) by mouth 2 (two) times daily. 180 tablet 2  . atorvastatin (LIPITOR) 10 MG tablet Take 1 tablet (10 mg total) by mouth daily. 90 tablet 3  . loratadine (CLARITIN) 10 MG tablet Take 10 mg by mouth as needed for allergies.     Marland Kitchen losartan-hydrochlorothiazide (HYZAAR) 50-12.5 MG per tablet Take 1 tablet by mouth daily. 90 tablet 3  . metoprolol succinate (TOPROL-XL) 50 MG 24 hr tablet Take 1 tablet (50 mg total) by mouth daily. Take with or immediately following a meal. 90 tablet 3  . Multiple Vitamins-Minerals (CENTRUM SILVER PO) Take 1 tablet by mouth daily.       No current facility-administered medications on file prior to visit.    No Known Allergies  Past Medical History  Diagnosis Date  . Renal insufficiency   . Adenocarcinoma     Prostate  . Grave's disease   . Hypertension   . Hyperlipidemia   . Allergic rhinitis   . sinus node dysfunction     s/p BTK pacemaker    Past Surgical History  Procedure Laterality Date  . Tonsillectomy  1947    adenoids  . Prostate cancer  2007    RT  . Pacemaker insertion  8/06; 05/02/2013    Bradycardia/ syncope; BTK with generator change 05/02/2013 by Dr Caryl Comes  . Cataract extraction w/ intraocular lens  implant, bilateral  2012  . Pacemaker generator change N/A 05/02/2013    Procedure: PACEMAKER GENERATOR CHANGE;  Surgeon: Deboraha Sprang, MD;  Location: [facility] CATH LAB;  Service: Cardiovascular;  Laterality: N/A;    Family History  Problem Relation Age of Onset  . Cancer Mother   . Diabetes Mother   . Coronary  artery disease Father     cabg  . Hypertension Neg Hx   . Prostate cancer Neg Hx   . Colon cancer Neg Hx     History   Social History  . Marital Status: Married    Spouse Name: N/A  . Number of Children: 2  . Years of Education: N/A   Occupational History  . Retired     Art gallery manager with [employer] History Main Topics  . Smoking status: Never Smoker   . Smokeless tobacco: Never Used  . Alcohol Use: No     Comment: Occasional  . Drug Use: No  . Sexual Activity: Not on file   Other Topics Concern  . Not on file   Social History Narrative   Has a living will.    Wife to make health care decisions (then daughter [name])--formal health care POA   Would accept resuscitation   Not sure about feeding tube but no "unusual attempts"            Review of Systems  Feels well No fever No other worrisome lesions     Objective:   Physical Exam  Constitutional: He appears well-developed and well-nourished. No distress.  Skin:  ~86mm seb keratosis on back Classic brown covering  over ~50% but looks like some of the covering came off and has some redness with new scabbing  Pedunculated skin tag on upper right eyelid--no pain          Assessment & Plan:

## 2014-09-02 ENCOUNTER — Other Ambulatory Visit: Payer: Self-pay | Admitting: Internal Medicine

## 2014-11-07 ENCOUNTER — Ambulatory Visit (INDEPENDENT_AMBULATORY_CARE_PROVIDER_SITE_OTHER): Payer: Medicare Other | Admitting: *Deleted

## 2014-11-07 DIAGNOSIS — I495 Sick sinus syndrome: Secondary | ICD-10-CM

## 2014-11-07 NOTE — Progress Notes (Signed)
Remote pacemaker transmission.   

## 2014-11-12 LAB — CUP PACEART REMOTE DEVICE CHECK
Brady Statistic RV Percent Paced: 0 %
Date Time Interrogation Session: 20160710150712
Lead Channel Impedance Value: 429 Ohm
Lead Channel Impedance Value: 819 Ohm
Lead Channel Pacing Threshold Amplitude: 0.3 V
Lead Channel Pacing Threshold Amplitude: 0.9 V
Lead Channel Pacing Threshold Pulse Width: 0.4 ms
Lead Channel Sensing Intrinsic Amplitude: 13 mV
Lead Channel Sensing Intrinsic Amplitude: 3.7 mV
Lead Channel Setting Pacing Amplitude: 0.9 V
Lead Channel Setting Pacing Pulse Width: 0.4 ms
MDC IDC MSMT LEADCHNL RV PACING THRESHOLD PULSEWIDTH: 0.4 ms
MDC IDC SET LEADCHNL RA PACING AMPLITUDE: 1.8 V
MDC IDC STAT BRADY RA PERCENT PACED: 99 %
Pulse Gen Model: 359529
Pulse Gen Serial Number: 68145068

## 2014-11-22 ENCOUNTER — Encounter: Payer: Self-pay | Admitting: Internal Medicine

## 2014-12-08 ENCOUNTER — Encounter: Payer: Self-pay | Admitting: Cardiology

## 2014-12-15 ENCOUNTER — Encounter: Payer: Self-pay | Admitting: Internal Medicine

## 2015-01-23 ENCOUNTER — Encounter: Payer: Self-pay | Admitting: Internal Medicine

## 2015-03-02 DIAGNOSIS — Z23 Encounter for immunization: Secondary | ICD-10-CM | POA: Diagnosis not present

## 2015-03-15 ENCOUNTER — Encounter: Payer: Medicare Other | Admitting: Internal Medicine

## 2015-03-20 ENCOUNTER — Encounter: Payer: Self-pay | Admitting: Internal Medicine

## 2015-03-20 ENCOUNTER — Ambulatory Visit (INDEPENDENT_AMBULATORY_CARE_PROVIDER_SITE_OTHER): Payer: Medicare Other | Admitting: Internal Medicine

## 2015-03-20 VITALS — BP 121/78 | HR 80 | Ht 64.0 in | Wt 159.5 lb

## 2015-03-20 DIAGNOSIS — I480 Paroxysmal atrial fibrillation: Secondary | ICD-10-CM

## 2015-03-20 DIAGNOSIS — Z950 Presence of cardiac pacemaker: Secondary | ICD-10-CM | POA: Diagnosis not present

## 2015-03-20 DIAGNOSIS — N183 Chronic kidney disease, stage 3 unspecified: Secondary | ICD-10-CM

## 2015-03-20 DIAGNOSIS — R55 Syncope and collapse: Secondary | ICD-10-CM

## 2015-03-20 DIAGNOSIS — I495 Sick sinus syndrome: Secondary | ICD-10-CM

## 2015-03-20 NOTE — Progress Notes (Signed)
Patient Care Team: Venia Carbon, MD as PCP - General   HPI  Jason Lambert is a 80 y.o. male Seen in followup for pacemaker implanted for sinus node dysfunction and syncope. He underwent device generator replacement 12/14.  The patient denies chest pain, shortness of breath, nocturnal dyspnea, orthopnea or peripheral edema.  There have been no palpitations, lightheadedness or syncope.    He would like to cahnge his noac     Past Medical History  Diagnosis Date  . Renal insufficiency   . Adenocarcinoma ([place])     Prostate  . Grave's disease   . Hypertension   . Hyperlipidemia   . Allergic rhinitis   . sinus node dysfunction     s/p BTK pacemaker    Past Surgical History  Procedure Laterality Date  . Tonsillectomy  1947    adenoids  . Prostate cancer  2007    RT  . Pacemaker insertion  8/06; 05/02/2013    Bradycardia/ syncope; BTK with generator change 05/02/2013 by Dr Caryl Comes  . Cataract extraction w/ intraocular lens  implant, bilateral  2012  . Pacemaker generator change N/A 05/02/2013    Procedure: PACEMAKER GENERATOR CHANGE;  Surgeon: Deboraha Sprang, MD;  Location: [facility] CATH LAB;  Service: Cardiovascular;  Laterality: N/A;    Current Outpatient Prescriptions  Medication Sig Dispense Refill  . apixaban (ELIQUIS) 5 MG TABS tablet Take 1 tablet (5 mg total) by mouth 2 (two) times daily. 180 tablet 2  . atorvastatin (LIPITOR) 10 MG tablet Take 1 tablet (10 mg total) by mouth daily. 90 tablet 3  . ELIQUIS 5 MG TABS tablet TAKE 1 TABLET (5 MG TOTAL) BY MOUTH 2 (TWO) TIMES DAILY. 60 tablet 3  . loratadine (CLARITIN) 10 MG tablet Take 10 mg by mouth as needed for allergies.     Marland Kitchen losartan-hydrochlorothiazide (HYZAAR) 50-12.5 MG per tablet Take 1 tablet by mouth daily. 90 tablet 3  . metoprolol succinate (TOPROL-XL) 50 MG 24 hr tablet Take 1 tablet (50 mg total) by mouth daily. Take with or immediately following a meal. 90 tablet 3  . Multiple Vitamins-Minerals  (CENTRUM SILVER PO) Take 1 tablet by mouth daily.       No current facility-administered medications for this visit.    No Known Allergies  Review of Systems negative except from HPI and PMH  Physical Exam BP 121/78 mmHg  Pulse 80  Ht 5\' 4"  (1.626 m)  Wt 159 lb 8 oz (72.349 kg)  BMI 27.36 kg/m2 Well developed and well nourished in no acute distress HENT normal E scleral and icterus clear Neck Supple JVP flat; carotids brisk and full Clear to ausculation Device pocket well healed; without hematoma or erythema.  There is no tethering  Regular rate and rhythm, no murmurs gallops or rub S1 is diminished +S4 Soft with active bowel sounds No clubbing cyanosis  Edema Alert and oriented, grossly normal motor and sensory function Skin Warm and Dry  ECG not oredered today  Assessment and  Plan  Sinus node dysfunction  100% a pacing  Pacemaker Biotoronik. The patient's device was interrogated.  The information was reviewed. No changes were made in the programming.  Atrial fibrilllation  Neurocardiogenic syncope  Renal insufficiency  Gd 3   No recurrent syncope  He would like to switch from [place] 2/2 cost issue We discussed the use of the NOACs compared to Coumadin. We briefly reviewed the data of at least comparability in stroke prevention,  bleeding and outcome. We discussed some of the new once wherein somewhat associated with decreased ischemic stroke risk, one to be taken daily, and has been shown to be comparable and bleeding risk to aspirin.  We also discussed bleeding associated with warfarin as well as NOACs and a wall bleeding as a complication of all these drugs intracranial bleeding is more frequently associated with warfarin then the NOACs and a GI bleeding is more commonly associated with the latter  He would like to try dabigitran and have reviewed the GI issue  GFR oneyear ago was 10  He is to have blood work drawn next month by his PCP and we will anticipate  stable renal function but he is to call us prior to initiaition

## 2015-03-20 NOTE — Patient Instructions (Addendum)
Medication Instructions: - none  Labwork: - none  Procedures/Testing: - none  Follow-Up: - Remote monitoring is used to monitor your Pacemaker of ICD from home. This monitoring reduces the number of office visits required to check your device to one time per year. It allows [place] to keep an eye on the functioning of your device to ensure it is working properly. You are scheduled for a device check from home on 06/19/15. You may send your transmission at any time that day. If you have a wireless device, the transmission will be sent automatically. After your physician reviews your transmission, you will receive a postcard with your next transmission date.  - Your physician wants you to follow-up in: 1 year with Dr. Caryl Comes. You will receive a reminder letter in the mail two months in advance. If you don't receive a letter, please call our office to schedule the follow-up appointment.  Any Additional Special Instructions Will Be Listed Below (If Applicable). - none

## 2015-03-21 LAB — CUP PACEART INCLINIC DEVICE CHECK
Date Time Interrogation Session: 20161116101913
Implantable Lead Implant Date: 20060801
Implantable Lead Location: 753860
Implantable Lead Model: 350973
Implantable Lead Serial Number: 24332742
MDC IDC LEAD IMPLANT DT: 20060801
MDC IDC LEAD LOCATION: 753859
MDC IDC LEAD SERIAL: 24319010
Pulse Gen Serial Number: 68145068

## 2015-03-22 ENCOUNTER — Other Ambulatory Visit: Payer: Self-pay | Admitting: Internal Medicine

## 2015-04-13 ENCOUNTER — Encounter: Payer: Self-pay | Admitting: Internal Medicine

## 2015-04-16 ENCOUNTER — Encounter: Payer: Self-pay | Admitting: Internal Medicine

## 2015-04-17 ENCOUNTER — Encounter: Payer: Self-pay | Admitting: Internal Medicine

## 2015-04-17 ENCOUNTER — Ambulatory Visit (INDEPENDENT_AMBULATORY_CARE_PROVIDER_SITE_OTHER): Payer: Medicare Other | Admitting: Internal Medicine

## 2015-04-17 VITALS — BP 120/68 | HR 77 | Temp 98.0°F | Ht 64.0 in | Wt 161.0 lb

## 2015-04-17 DIAGNOSIS — N183 Chronic kidney disease, stage 3 unspecified: Secondary | ICD-10-CM

## 2015-04-17 DIAGNOSIS — I4891 Unspecified atrial fibrillation: Secondary | ICD-10-CM

## 2015-04-17 DIAGNOSIS — Z7189 Other specified counseling: Secondary | ICD-10-CM

## 2015-04-17 DIAGNOSIS — E785 Hyperlipidemia, unspecified: Secondary | ICD-10-CM

## 2015-04-17 DIAGNOSIS — I10 Essential (primary) hypertension: Secondary | ICD-10-CM

## 2015-04-17 DIAGNOSIS — Z0000 Encounter for general adult medical examination without abnormal findings: Secondary | ICD-10-CM

## 2015-04-17 LAB — CBC WITH DIFFERENTIAL/PLATELET
Basophils Absolute: 0 10*3/uL (ref 0.0–0.1)
Basophils Relative: 0.4 % (ref 0.0–3.0)
EOS ABS: 0.1 10*3/uL (ref 0.0–0.7)
Eosinophils Relative: 0.8 % (ref 0.0–5.0)
HEMATOCRIT: 37.8 % — AB (ref 39.0–52.0)
HEMOGLOBIN: 12.9 g/dL — AB (ref 13.0–17.0)
LYMPHS ABS: 1.4 10*3/uL (ref 0.7–4.0)
LYMPHS PCT: 21.4 % (ref 12.0–46.0)
MCHC: 34.1 g/dL (ref 30.0–36.0)
MCV: 97.1 fl (ref 78.0–100.0)
MONO ABS: 0.9 10*3/uL (ref 0.1–1.0)
Monocytes Relative: 14.4 % — ABNORMAL HIGH (ref 3.0–12.0)
NEUTROS ABS: 4.1 10*3/uL (ref 1.4–7.7)
Neutrophils Relative %: 63 % (ref 43.0–77.0)
Platelets: 257 10*3/uL (ref 150.0–400.0)
RBC: 3.89 Mil/uL — AB (ref 4.22–5.81)
RDW: 12.6 % (ref 11.5–15.5)
WBC: 6.5 10*3/uL (ref 4.0–10.5)

## 2015-04-17 LAB — HEPATIC FUNCTION PANEL
ALT: 15 U/L (ref 0–53)
AST: 14 U/L (ref 0–37)
Albumin: 4.2 g/dL (ref 3.5–5.2)
Alkaline Phosphatase: 85 U/L (ref 39–117)
Bilirubin, Direct: 0.1 mg/dL (ref 0.0–0.3)
Total Bilirubin: 0.9 mg/dL (ref 0.2–1.2)
Total Protein: 6.9 g/dL (ref 6.0–8.3)

## 2015-04-17 LAB — LIPID PANEL
CHOL/HDL RATIO: 4
CHOLESTEROL: 133 mg/dL (ref 0–200)
HDL: 37.2 mg/dL — ABNORMAL LOW (ref >39.00)
NONHDL: 95.45
Triglycerides: 218 mg/dL — ABNORMAL HIGH (ref 0.0–149.0)
VLDL: 43.6 mg/dL — AB (ref 0.0–40.0)

## 2015-04-17 LAB — RENAL FUNCTION PANEL
ALBUMIN: 4.2 g/dL (ref 3.5–5.2)
BUN: 19 mg/dL (ref 6–23)
CALCIUM: 9.5 mg/dL (ref 8.4–10.5)
CHLORIDE: 96 meq/L (ref 96–112)
CO2: 29 mEq/L (ref 19–32)
Creatinine, Ser: 1.49 mg/dL (ref 0.40–1.50)
GFR: 48.43 mL/min — ABNORMAL LOW (ref >60.00)
Glucose, Bld: 94 mg/dL (ref 70–99)
POTASSIUM: 4.6 meq/L (ref 3.5–5.1)
Phosphorus: 4.1 mg/dL (ref 2.3–4.6)
SODIUM: 132 meq/L — AB (ref 135–145)

## 2015-04-17 LAB — LDL CHOLESTEROL, DIRECT: Direct LDL: 73 mg/dL

## 2015-04-17 LAB — T4, FREE: Free T4: 0.78 ng/dL (ref 0.60–1.60)

## 2015-04-17 MED ORDER — ATORVASTATIN CALCIUM 10 MG PO TABS: 10.0000 mg | ORAL_TABLET | Freq: Every day | ORAL | Status: DC

## 2015-04-17 NOTE — Assessment & Plan Note (Signed)
See social history 

## 2015-04-17 NOTE — Assessment & Plan Note (Signed)
No problems with statin Due for labs 

## 2015-04-17 NOTE — Assessment & Plan Note (Signed)
Has never had symptoms May be changing to xarelto for price

## 2015-04-17 NOTE — Assessment & Plan Note (Signed)
I have personally reviewed the Medicare Annual Wellness questionnaire and have noted 1. The patient's medical and social history 2. Their use of alcohol, tobacco or illicit drugs 3. Their current medications and supplements 4. The patient's functional ability including ADL's, fall risks, home safety risks and hearing or visual             impairment. 5. Diet and physical activities 6. Evidence for depression or mood disorders  The patients weight, height, BMI and visual acuity have been recorded in the chart I have made referrals, counseling and provided education to the patient based review of the above and I have provided the pt with a written personalized care plan for preventive services.  I have provided you with a copy of your personalized plan for preventive services. Please take the time to review along with your updated medication list.  UTD on immunizations No cancer screening due to age--after discussion Regular with exercise, etc

## 2015-04-17 NOTE — Progress Notes (Signed)
Subjective:    Patient ID: Jason Lambert, male    DOB: [birth year], 80 y.o.   MRN: [id number]  HPI Here for Medicare wellness and follow up of chronic medical conditions Reviewed form and advanced directives Reviewed other doctors Rare alcohol drink--wine usually Regular exercise No tobacco Vision is fine. Mild hearing problems in noisy enviroments No falls No depression or anhedonia Independent with instrumental ADLs No cognitive problems  Long standing atrial fibrillation apixaban too much money--- will probably change to xarelto No palpitatations Mild dizziness if he gets up fast-- rare syncope (but can't remember the last time) Mild edema --usually only a problem with travel. Slight at ankles--mostly with prolonged sitting. No change in exercise tolerance  No trouble with statin No myalgias or GI problems  Current Outpatient Prescriptions on File Prior to Visit  Medication Sig Dispense Refill  . apixaban (ELIQUIS) 5 MG TABS tablet Take 1 tablet (5 mg total) by mouth 2 (two) times daily. 180 tablet 2  . loratadine (CLARITIN) 10 MG tablet Take 10 mg by mouth as needed for allergies.     Marland Kitchen losartan-hydrochlorothiazide (HYZAAR) 50-12.5 MG tablet TAKE 1 TABLET DAILY 90 tablet 3  . metoprolol succinate (TOPROL-XL) 50 MG 24 hr tablet TAKE 1 TABLET DAILY. TAKE  WITH OR IMMEDIATELY        FOLLOWING A MEAL. 90 tablet 3  . Multiple Vitamins-Minerals (CENTRUM SILVER PO) Take 1 tablet by mouth daily.       No current facility-administered medications on file prior to visit.    No Known Allergies  Past Medical History  Diagnosis Date  . Renal insufficiency   . Adenocarcinoma ([place])     Prostate  . Grave's disease   . Hypertension   . Hyperlipidemia   . Allergic rhinitis   . sinus node dysfunction     s/p BTK pacemaker  . Atrial fibrillation [facility])     Past Surgical History  Procedure Laterality Date  . Tonsillectomy  1947    adenoids  . Prostate cancer  2007    RT    . Pacemaker insertion  8/06; 05/02/2013    Bradycardia/ syncope; BTK with generator change 05/02/2013 by Dr Caryl Comes  . Cataract extraction w/ intraocular lens  implant, bilateral  2012  . Pacemaker generator change N/A 05/02/2013    Procedure: PACEMAKER GENERATOR CHANGE;  Surgeon: Deboraha Sprang, MD;  Location: [facility] CATH LAB;  Service: Cardiovascular;  Laterality: N/A;    Family History  Problem Relation Age of Onset  . Cancer Mother   . Diabetes Mother   . Coronary artery disease Father     cabg  . Hypertension Neg Hx   . Prostate cancer Neg Hx   . Colon cancer Neg Hx     Social History   Social History  . Marital Status: Married    Spouse Name: N/A  . Number of Children: 2  . Years of Education: N/A   Occupational History  . 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 Smoking status: Never Smoker   . Smokeless tobacco: Never Used  . Alcohol Use: No     Comment: Occasional  . Drug Use: No  . Sexual Activity: Not on file   Other Topics Concern  . Not on file   Social History Narrative   Has a living will.    Wife to make health care decisions (then daughter [name])--formal health care POA   Would  accept resuscitation   Not sure about feeding tube but no "unusual attempts"            Review of Systems Appetite is good Weight is stable Sleeps well Wears seat belt Teeth okay--regular with dentist [facility]) Mild shoulder pain --no meds. No regular back pain. Bowels are fine Mildly slow stream at times. Nocturia stable at 1-2 No new skin issues or suspicious lesions    Objective:   Physical Exam  Constitutional: He is oriented to person, place, and time. He appears well-developed and well-nourished. No distress.  HENT:  Mouth/Throat: Oropharynx is clear and moist. No oropharyngeal exudate.  Neck: Normal range of motion. Neck supple. No thyromegaly present.  Cardiovascular: Normal rate, regular rhythm, normal heart sounds and  intact distal pulses.  Exam reveals no gallop.   No murmur heard. Pulmonary/Chest: Effort normal and breath sounds normal. No respiratory distress. He has no wheezes. He has no rales.  Abdominal: Soft. There is no tenderness.  Musculoskeletal: He exhibits no edema or tenderness.  Lymphadenopathy:    He has no cervical adenopathy.  Neurological: He is alert and oriented to person, place, and time.  President-- "Obama, Clinton (then) Bush" [id number] D-l-[id number]-w Recall 3/3  Skin: No rash noted. No erythema.  Psychiatric: He has a normal mood and affect. His behavior is normal.          Assessment & Plan:

## 2015-04-17 NOTE — Assessment & Plan Note (Signed)
BP Readings from Last 3 Encounters:  04/17/15 120/68  03/20/15 121/78  08/30/14 122/70   Good control No changes needed

## 2015-04-17 NOTE — Progress Notes (Signed)
Pre visit review using our clinic review tool, if applicable. No additional management support is needed unless otherwise documented below in the visit note. 

## 2015-04-17 NOTE — Assessment & Plan Note (Signed)
Will recheck On ARB now

## 2015-04-20 ENCOUNTER — Encounter: Payer: Medicare Other | Admitting: Internal Medicine

## 2015-04-20 ENCOUNTER — Other Ambulatory Visit: Payer: Self-pay | Admitting: *Deleted

## 2015-04-20 MED ORDER — RIVAROXABAN 15 MG PO TABS: 15.0000 mg | ORAL_TABLET | Freq: Two times a day (BID) | ORAL | Status: DC

## 2015-04-20 MED ORDER — RIVAROXABAN 15 MG PO TABS: 15.0000 mg | ORAL_TABLET | Freq: Every day | ORAL | Status: DC

## 2015-05-03 ENCOUNTER — Encounter: Payer: Self-pay | Admitting: *Deleted

## 2015-06-19 ENCOUNTER — Ambulatory Visit (INDEPENDENT_AMBULATORY_CARE_PROVIDER_SITE_OTHER): Payer: Medicare Other | Admitting: *Deleted

## 2015-06-19 DIAGNOSIS — Z950 Presence of cardiac pacemaker: Secondary | ICD-10-CM

## 2015-06-19 DIAGNOSIS — I495 Sick sinus syndrome: Secondary | ICD-10-CM

## 2015-06-19 NOTE — Progress Notes (Signed)
Remote pacemaker transmission.   

## 2015-06-20 ENCOUNTER — Encounter: Payer: Self-pay | Admitting: Cardiology

## 2015-06-20 LAB — CUP PACEART INCLINIC DEVICE CHECK
Brady Statistic RV Percent Paced: 0 %
Implantable Lead Implant Date: 20060801
Implantable Lead Serial Number: 24332742
Lead Channel Impedance Value: 449 Ohm
Lead Channel Pacing Threshold Amplitude: 0.9 V
Lead Channel Pacing Threshold Pulse Width: 0.4 ms
Lead Channel Pacing Threshold Pulse Width: 0.4 ms
Lead Channel Sensing Intrinsic Amplitude: 4.1 mV
MDC IDC LEAD IMPLANT DT: 20060801
MDC IDC LEAD LOCATION: 753859
MDC IDC LEAD LOCATION: 753860
MDC IDC LEAD MODEL: 350973
MDC IDC LEAD SERIAL: 24319010
MDC IDC MSMT LEADCHNL RV IMPEDANCE VALUE: 878 Ohm
MDC IDC MSMT LEADCHNL RV PACING THRESHOLD AMPLITUDE: 0.3 V
MDC IDC MSMT LEADCHNL RV SENSING INTR AMPL: 13 mV
MDC IDC SESS DTM: 20170215165107
MDC IDC STAT BRADY RA PERCENT PACED: 99 %
Pulse Gen Serial Number: 68145068

## 2015-09-18 ENCOUNTER — Ambulatory Visit (INDEPENDENT_AMBULATORY_CARE_PROVIDER_SITE_OTHER): Payer: Medicare Other | Admitting: *Deleted

## 2015-09-18 DIAGNOSIS — I495 Sick sinus syndrome: Secondary | ICD-10-CM

## 2015-09-18 DIAGNOSIS — Z950 Presence of cardiac pacemaker: Secondary | ICD-10-CM

## 2015-09-18 NOTE — Progress Notes (Signed)
Remote pacemaker transmission.   

## 2015-10-11 LAB — CUP PACEART REMOTE DEVICE CHECK
Brady Statistic RA Percent Paced: 99 %
Brady Statistic RV Percent Paced: 1 %
Implantable Lead Implant Date: 20060801
Implantable Lead Location: 753860
Implantable Lead Model: 350973
Implantable Lead Serial Number: 24332742
Lead Channel Impedance Value: 429 Ohm
Lead Channel Pacing Threshold Pulse Width: 0.4 ms
Lead Channel Sensing Intrinsic Amplitude: 3.2 mV
MDC IDC LEAD IMPLANT DT: 20060801
MDC IDC LEAD LOCATION: 753859
MDC IDC LEAD SERIAL: 24319010
MDC IDC MSMT LEADCHNL RA PACING THRESHOLD AMPLITUDE: 0.9 V
MDC IDC MSMT LEADCHNL RA PACING THRESHOLD PULSEWIDTH: 0.4 ms
MDC IDC MSMT LEADCHNL RV IMPEDANCE VALUE: 800 Ohm
MDC IDC MSMT LEADCHNL RV PACING THRESHOLD AMPLITUDE: 0.3 V
MDC IDC MSMT LEADCHNL RV SENSING INTR AMPL: 12.5 mV
MDC IDC SESS DTM: 20170608135913
Pulse Gen Serial Number: 68145068

## 2015-10-18 ENCOUNTER — Encounter: Payer: Self-pay | Admitting: Cardiology

## 2015-12-18 ENCOUNTER — Ambulatory Visit (INDEPENDENT_AMBULATORY_CARE_PROVIDER_SITE_OTHER): Payer: Medicare Other | Admitting: *Deleted

## 2015-12-18 DIAGNOSIS — I495 Sick sinus syndrome: Secondary | ICD-10-CM

## 2015-12-18 DIAGNOSIS — Z950 Presence of cardiac pacemaker: Secondary | ICD-10-CM

## 2015-12-18 NOTE — Progress Notes (Signed)
Remote pacemaker transmission.   

## 2015-12-19 ENCOUNTER — Encounter: Payer: Self-pay | Admitting: Cardiology

## 2015-12-21 LAB — CUP PACEART REMOTE DEVICE CHECK
Brady Statistic RA Percent Paced: 99 %
Brady Statistic RV Percent Paced: 1 %
Implantable Lead Implant Date: 20060801
Implantable Lead Location: 753860
Implantable Lead Model: 350973
Implantable Lead Serial Number: 24332742
Lead Channel Impedance Value: 449 Ohm
Lead Channel Pacing Threshold Amplitude: 0.3 V
Lead Channel Pacing Threshold Pulse Width: 0.4 ms
Lead Channel Pacing Threshold Pulse Width: 0.4 ms
Lead Channel Sensing Intrinsic Amplitude: 3.2 mV
MDC IDC LEAD IMPLANT DT: 20060801
MDC IDC LEAD LOCATION: 753859
MDC IDC LEAD SERIAL: 24319010
MDC IDC MSMT LEADCHNL RA PACING THRESHOLD AMPLITUDE: 0.8 V
MDC IDC MSMT LEADCHNL RV IMPEDANCE VALUE: 858 Ohm
MDC IDC MSMT LEADCHNL RV SENSING INTR AMPL: 13.7 mV
MDC IDC SESS DTM: 20170818164036
Pulse Gen Serial Number: 68145068

## 2016-01-17 DIAGNOSIS — Z23 Encounter for immunization: Secondary | ICD-10-CM | POA: Diagnosis not present

## 2016-02-22 ENCOUNTER — Other Ambulatory Visit: Payer: Self-pay | Admitting: Internal Medicine

## 2016-03-11 ENCOUNTER — Encounter: Payer: Medicare Other | Admitting: Internal Medicine

## 2016-04-08 ENCOUNTER — Encounter: Payer: Self-pay | Admitting: Internal Medicine

## 2016-04-08 ENCOUNTER — Ambulatory Visit (INDEPENDENT_AMBULATORY_CARE_PROVIDER_SITE_OTHER): Payer: Medicare Other | Admitting: Internal Medicine

## 2016-04-08 VITALS — BP 120/64 | HR 70 | Ht 65.0 in | Wt 160.2 lb

## 2016-04-08 DIAGNOSIS — I480 Paroxysmal atrial fibrillation: Secondary | ICD-10-CM | POA: Diagnosis not present

## 2016-04-08 DIAGNOSIS — I495 Sick sinus syndrome: Secondary | ICD-10-CM | POA: Diagnosis not present

## 2016-04-08 DIAGNOSIS — E782 Mixed hyperlipidemia: Secondary | ICD-10-CM | POA: Diagnosis not present

## 2016-04-08 DIAGNOSIS — Z950 Presence of cardiac pacemaker: Secondary | ICD-10-CM | POA: Diagnosis not present

## 2016-04-08 NOTE — Patient Instructions (Addendum)
Medication Instructions: - Your physician recommends that you continue on your current medications as directed. Please refer to the Current Medication list given to you today.  Labwork: - Your physician recommends that you have lab work today: CMET/ TSH/ CBC/ Lipid panel  Procedures/Testing: - none ordered  Follow-Up: - Remote monitoring is used to monitor your Pacemaker of ICD from home. This monitoring reduces the number of office visits required to check your device to one time per year. It allows [place] to keep an eye on the functioning of your device to ensure it is working properly. You are scheduled for a device check from home on 07/08/16. You may send your transmission at any time that day. If you have a wireless device, the transmission will be sent automatically. After your physician reviews your transmission, you will receive a postcard with your next transmission date.  - Your physician wants you to follow-up in: 1 year with Dr. Caryl Comes. You will receive a reminder letter in the mail two months in advance. If you don't receive a letter, please call our office to schedule the follow-up appointment.  Any Additional Special Instructions Will Be Listed Below (If Applicable).     If you need a refill on your cardiac medications before your next appointment, please call your pharmacy.

## 2016-04-08 NOTE — Progress Notes (Signed)
Patient Care Team: Venia Carbon, MD as PCP - General   HPI  Jason Lambert is a 81 y.o. male Seen in followup for pacemaker implanted for sinus node dysfunction and syncope. He has a Biotronik device. He underwent  generator replacement 12/14.  The patient denies chest pain, shortness of breath, nocturnal dyspnea, orthopnea or peripheral edema.  There have been no palpitations, lightheadedness or syncope.   No bleeding issues    12/16 Cr 1.46 (GFR 48.4)  K 4.6 Hgb 12.9     Past Medical History:  Diagnosis Date  . Adenocarcinoma ([place])    Prostate  . Allergic rhinitis   . Atrial fibrillation ([place])   . Grave's disease   . Hyperlipidemia   . Hypertension   . Renal insufficiency   . sinus node dysfunction    s/p BTK pacemaker    Past Surgical History:  Procedure Laterality Date  . CATARACT EXTRACTION W/ INTRAOCULAR LENS  IMPLANT, BILATERAL  2012  . PACEMAKER GENERATOR CHANGE N/A 05/02/2013   Procedure: PACEMAKER GENERATOR CHANGE;  Surgeon: Deboraha Sprang, MD;  Location[facility] CATH LAB;  Service: Cardiovascular;  Laterality: N/A;  . PACEMAKER INSERTION  8/06; 05/02/2013   Bradycardia/ syncope; BTK with generator change 05/02/2013 by Dr Caryl Comes  . Prostate Cancer  2007   RT  . TONSILLECTOMY  1947   adenoids    Current Outpatient Prescriptions  Medication Sig Dispense Refill  . atorvastatin (LIPITOR) 10 MG tablet Take 1 tablet (10 mg total) by mouth daily. 90 tablet 3  . loratadine (CLARITIN) 10 MG tablet Take 10 mg by mouth as needed for allergies.     Marland Kitchen losartan-hydrochlorothiazide (HYZAAR) 50-12.5 MG tablet TAKE 1 TABLET DAILY 90 tablet 1  . metoprolol succinate (TOPROL-XL) 50 MG 24 hr tablet TAKE 1 TABLET DAILY. TAKE  WITH OR IMMEDIATELY        FOLLOWING A MEAL. 90 tablet 1  . Multiple Vitamins-Minerals (CENTRUM SILVER PO) Take 1 tablet by mouth daily.      . Rivaroxaban (XARELTO) 15 MG TABS tablet Take 1 tablet (15 mg total) by mouth daily with supper. 90  tablet 3   No current facility-administered medications for this visit.     No Known Allergies  Review of Systems negative except from HPI and PMH  Physical Exam BP 120/64 (BP Location: Left Arm, Patient Position: Sitting, Cuff Size: Normal)   Pulse 70   Ht 5\' 5"  (1.651 m)   Wt 160 lb 4 oz (72.7 kg)   BMI 26.67 kg/m  Well developed and well nourished in no acute distress HENT normal E scleral and icterus clear Neck Supple JVP flat; carotids brisk and full Clear to ausculation Device pocket well healed; without hematoma or erythema.  There is no tethering  Regular rate and rhythm, no murmurs gallops or rub S1 is diminished +S4 Soft with active bowel sounds No clubbing cyanosis  Edema Alert and oriented, grossly normal motor and sensory function Skin Warm and Dry  ECG  A paced 70 24/10/39  Assessment and  Plan  Sinus node dysfunction  100% a pacing  Pacemaker Biotoronik. The patient's device was interrogated.  The information was reviewed. Rate response was over zealous and reprogrammed CLS High>>med \ Atrial fibrilllation  Neurocardiogenic syncope  Hypertension  Hyperlipidemia  Renal insufficiency  Gd 3   No recurrent syncope  No intercurrent atrial fibrillation or flutter  Will recheck GFR and Hgb   He would like to resume  apixoban now that it is covered by insurance  He is to see his PCP in about 2 weeks.  He should be dosed with apixoban at 5 mg bid.  He can be reassessed 7/18 when he turns 82 to see if reduction to 2.5 would be appropriate.     His BP is lowish here   I have asked him to check his BP at home weekly in anticipation of his PCP visit.   It may be that his antihypertensives could be reduced  Will check his lipids *  Reviewed NOAC data

## 2016-04-09 LAB — COMPREHENSIVE METABOLIC PANEL
A/G RATIO: 1.9 (ref 1.2–2.2)
ALT: 21 IU/L (ref 0–44)
AST: 19 IU/L (ref 0–40)
Albumin: 4.6 g/dL (ref 3.5–4.8)
Alkaline Phosphatase: 94 IU/L (ref 39–117)
BILIRUBIN TOTAL: 0.5 mg/dL (ref 0.0–1.2)
BUN/Creatinine Ratio: 14 (ref 10–24)
BUN: 23 mg/dL (ref 8–27)
CHLORIDE: 98 mmol/L (ref 96–106)
CO2: 24 mmol/L (ref 18–29)
Calcium: 9.3 mg/dL (ref 8.6–10.2)
Creatinine, Ser: 1.67 mg/dL — ABNORMAL HIGH (ref 0.76–1.27)
GFR calc non Af Amer: 38 mL/min/{1.73_m2} — ABNORMAL LOW (ref >59)
GFR, EST AFRICAN AMERICAN: 44 mL/min/{1.73_m2} — AB (ref >59)
Globulin, Total: 2.4 g/dL (ref 1.5–4.5)
Glucose: 112 mg/dL — ABNORMAL HIGH (ref 65–99)
POTASSIUM: 4.6 mmol/L (ref 3.5–5.2)
Sodium: 139 mmol/L (ref 134–144)
TOTAL PROTEIN: 7 g/dL (ref 6.0–8.5)

## 2016-04-09 LAB — CBC WITH DIFFERENTIAL/PLATELET
BASOS: 0 %
Basophils Absolute: 0 10*3/uL (ref 0.0–0.2)
EOS (ABSOLUTE): 0.1 10*3/uL (ref 0.0–0.4)
Eos: 1 %
Hematocrit: 35 % — ABNORMAL LOW (ref 37.5–51.0)
Hemoglobin: 12.3 g/dL — ABNORMAL LOW (ref 13.0–17.7)
IMMATURE GRANULOCYTES: 0 %
Immature Grans (Abs): 0 10*3/uL (ref 0.0–0.1)
Lymphocytes Absolute: 1.1 10*3/uL (ref 0.7–3.1)
Lymphs: 20 %
MCH: 33.4 pg — AB (ref 26.6–33.0)
MCHC: 35.1 g/dL (ref 31.5–35.7)
MCV: 95 fL (ref 79–97)
MONOS ABS: 0.9 10*3/uL (ref 0.1–0.9)
Monocytes: 16 %
NEUTROS PCT: 63 %
Neutrophils Absolute: 3.4 10*3/uL (ref 1.4–7.0)
PLATELETS: 231 10*3/uL (ref 150–379)
RBC: 3.68 x10E6/uL — ABNORMAL LOW (ref 4.14–5.80)
RDW: 13.1 % (ref 12.3–15.4)
WBC: 5.5 10*3/uL (ref 3.4–10.8)

## 2016-04-09 LAB — LIPID PANEL
Chol/HDL Ratio: 3.4 ratio units (ref 0.0–5.0)
Cholesterol, Total: 147 mg/dL (ref 100–199)
HDL: 43 mg/dL (ref >39)
LDL Calculated: 72 mg/dL (ref 0–99)
Triglycerides: 162 mg/dL — ABNORMAL HIGH (ref 0–149)
VLDL Cholesterol Cal: 32 mg/dL (ref 5–40)

## 2016-04-09 LAB — TSH: TSH: 1.72 u[IU]/mL (ref 0.450–4.500)

## 2016-04-25 ENCOUNTER — Encounter: Payer: Self-pay | Admitting: Internal Medicine

## 2016-04-25 ENCOUNTER — Ambulatory Visit (INDEPENDENT_AMBULATORY_CARE_PROVIDER_SITE_OTHER): Payer: Medicare Other | Admitting: Internal Medicine

## 2016-04-25 VITALS — BP 112/70 | HR 70 | Temp 97.6°F | Ht 64.75 in | Wt 159.0 lb

## 2016-04-25 DIAGNOSIS — E785 Hyperlipidemia, unspecified: Secondary | ICD-10-CM | POA: Diagnosis not present

## 2016-04-25 DIAGNOSIS — N183 Chronic kidney disease, stage 3 unspecified: Secondary | ICD-10-CM

## 2016-04-25 DIAGNOSIS — I4891 Unspecified atrial fibrillation: Secondary | ICD-10-CM | POA: Diagnosis not present

## 2016-04-25 DIAGNOSIS — I10 Essential (primary) hypertension: Secondary | ICD-10-CM | POA: Diagnosis not present

## 2016-04-25 DIAGNOSIS — Z8546 Personal history of malignant neoplasm of prostate: Secondary | ICD-10-CM

## 2016-04-25 DIAGNOSIS — Z7189 Other specified counseling: Secondary | ICD-10-CM

## 2016-04-25 DIAGNOSIS — Z0000 Encounter for general adult medical examination without abnormal findings: Secondary | ICD-10-CM | POA: Diagnosis not present

## 2016-04-25 MED ORDER — LOSARTAN POTASSIUM 50 MG PO TABS: 50.0000 mg | ORAL_TABLET | Freq: Every day | ORAL | 3 refills | Status: DC

## 2016-04-25 NOTE — Assessment & Plan Note (Signed)
I have personally reviewed the Medicare Annual Wellness questionnaire and have noted 1. The patient's medical and social history 2. Their use of alcohol, tobacco or illicit drugs 3. Their current medications and supplements 4. The patient's functional ability including ADL's, fall risks, home safety risks and hearing or visual             impairment. 5. Diet and physical activities 6. Evidence for depression or mood disorders  The patients weight, height, BMI and visual acuity have been recorded in the chart I have made referrals, counseling and provided education to the patient based review of the above and I have provided the pt with a written personalized care plan for preventive services.  I have provided you with a copy of your personalized plan for preventive services. Please take the time to review along with your updated medication list.  No colon cancer screening due to age UTD with imms Good about fitness

## 2016-04-25 NOTE — Assessment & Plan Note (Signed)
GFR down some with last measurement BP low Will stop the HCTZ and recheck in 1 month

## 2016-04-25 NOTE — Progress Notes (Signed)
Subjective:    Patient ID: Jason Lambert, male    DOB: December 30, 1934, 81 y.o.   MRN: [id number]  HPI Here for Medicare wellness visit and follow up of chronic health conditions Reviewed form and advanced directives Reviewed other doctors Occasional glass of wine No tobacco Still exercises regularly No falls--but gets rare spells of lightheadedness (able to lie down and recovers) No depression or anhedonia Vision and hearing are okay Independent with instrumental ADLs No sig problems with memory  Recent visit to Dr Caryl Comes Changing back to eliquis from the xarelto Slight decline in GFR BP running low--wondering about his meds Chronic mild orthostatic dizziness--- very brief No chest pain No palpitations No SOB Exercise tolerance is stable  No problems with statin No myalgias  Discussed the prostate cancer Would be okay with one more test  Current Outpatient Prescriptions on File Prior to Visit  Medication Sig Dispense Refill  . atorvastatin (LIPITOR) 10 MG tablet Take 1 tablet (10 mg total) by mouth daily. 90 tablet 3  . loratadine (CLARITIN) 10 MG tablet Take 10 mg by mouth as needed for allergies.     Marland Kitchen losartan-hydrochlorothiazide (HYZAAR) 50-12.5 MG tablet TAKE 1 TABLET DAILY 90 tablet 1  . metoprolol succinate (TOPROL-XL) 50 MG 24 hr tablet TAKE 1 TABLET DAILY. TAKE  WITH OR IMMEDIATELY        FOLLOWING A MEAL. 90 tablet 1  . Multiple Vitamins-Minerals (CENTRUM SILVER PO) Take 1 tablet by mouth daily.      . Rivaroxaban (XARELTO) 15 MG TABS tablet Take 1 tablet (15 mg total) by mouth daily with supper. 90 tablet 3   No current facility-administered medications on file prior to visit.     No Known Allergies  Past Medical History:  Diagnosis Date  . 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 Allergic rhinitis   . Atrial fibrillation ([place])   . Grave's disease   . Hyperlipidemia   . Hypertension   . Renal insufficiency   . sinus node dysfunction    s/p BTK pacemaker     Past Surgical History:  Procedure Laterality Date  . CATARACT EXTRACTION W/ INTRAOCULAR LENS  IMPLANT, BILATERAL  2012  . PACEMAKER GENERATOR CHANGE N/A 05/02/2013   Procedure: PACEMAKER GENERATOR CHANGE;  Surgeon: Deboraha Sprang, MD;  Location: [facility] CATH LAB;  Service: Cardiovascular;  Laterality: N/A;  . PACEMAKER INSERTION  8/06; 05/02/2013   Bradycardia/ syncope; BTK with generator change 05/02/2013 by Dr Caryl Comes  . Prostate Cancer  2007   RT  . TONSILLECTOMY  1947   adenoids    Family History  Problem Relation Age of Onset  . Cancer Mother   . Diabetes Mother   . Coronary artery disease Father     cabg  . Hypertension Neg Hx   . Prostate cancer Neg Hx   . Colon cancer Neg Hx     Social History   Social History  . Marital status: Married    Spouse name: N/A  . Number of children: 2  . Years of education: N/A   Occupational History  . Retired     Art gallery manager with [employer] History Main Topics  . Smoking status: Never Smoker  . Smokeless tobacco: Never Used  . Alcohol use No     Comment: Occasional  . Drug use: No  . Sexual activity: Not on file   Other Topics Concern  . Not on file   Social History Narrative   Has  a living will.    Wife to make health care decisions (then daughter [name])--formal health care POA   Would accept resuscitation   Not sure about feeding tube but no "unusual attempts"            Review of Systems Usually sleeps well Appetite is fine Weight is stable Wears seat belt Teeth are okay--keeps up with the dentist Bowels are okay. No blood Mild trouble with urinary stream-- nothing worrisome. Nocturia stable at 2/night No rash or suspicious lesions No sig back or joint pain--controlled by exercise    Objective:   Physical Exam  Constitutional: He is oriented to person, place, and time. He appears well-developed and well-nourished. No distress.  HENT:  Mouth/Throat: Oropharynx is clear and moist. No oropharyngeal  exudate.  Neck: Normal range of motion. Neck supple. No thyromegaly present.  Cardiovascular: Normal rate, regular rhythm, normal heart sounds and intact distal pulses.  Exam reveals no gallop.   No murmur heard. Pulmonary/Chest: Effort normal and breath sounds normal. No respiratory distress. He has no wheezes. He has no rales.  Abdominal: Soft. There is no tenderness.  Musculoskeletal: He exhibits no edema or tenderness.  Lymphadenopathy:    He has no cervical adenopathy.  Neurological: He is alert and oriented to person, place, and time.  President--- "Milinda Pointer, Clinton or Bush?---Obama" [phone] D-l-r-o-w Recall 3/3  Skin: No rash noted. No erythema.  Psychiatric: He has a normal mood and affect. His behavior is normal.          Assessment & Plan:

## 2016-04-25 NOTE — Assessment & Plan Note (Signed)
BP Readings from Last 3 Encounters:  04/25/16 112/70  04/08/16 120/64  04/17/15 120/68   Mild chronic orthostatic dizziness Will try off the HCTZ

## 2016-04-25 NOTE — Assessment & Plan Note (Addendum)
Paced  Will switch back to eliquis when he runs out of xarelto in February Since almost 82, if GFR still under 60, would use 2.5 bid

## 2016-04-25 NOTE — Progress Notes (Signed)
Pre visit review using our clinic review tool, if applicable. No additional management support is needed unless otherwise documented below in the visit note. 

## 2016-04-25 NOTE — Assessment & Plan Note (Signed)
Will check 1 last PSA 

## 2016-04-25 NOTE — Patient Instructions (Addendum)
Please change to just plain losartan. I will send the eliquis prescription after your next blood work.

## 2016-04-25 NOTE — Assessment & Plan Note (Signed)
Satisfied with the primary prevention

## 2016-04-25 NOTE — Assessment & Plan Note (Signed)
See social history 

## 2016-05-07 ENCOUNTER — Other Ambulatory Visit: Payer: Self-pay | Admitting: Internal Medicine

## 2016-05-27 ENCOUNTER — Telehealth: Payer: Self-pay

## 2016-05-27 ENCOUNTER — Other Ambulatory Visit (INDEPENDENT_AMBULATORY_CARE_PROVIDER_SITE_OTHER): Payer: PPO

## 2016-05-27 DIAGNOSIS — Z8546 Personal history of malignant neoplasm of prostate: Secondary | ICD-10-CM

## 2016-05-27 DIAGNOSIS — N183 Chronic kidney disease, stage 3 unspecified: Secondary | ICD-10-CM

## 2016-05-27 LAB — RENAL FUNCTION PANEL
ALBUMIN: 4.3 g/dL (ref 3.5–5.2)
BUN: 24 mg/dL — AB (ref 6–23)
CO2: 28 meq/L (ref 19–32)
Calcium: 9.3 mg/dL (ref 8.4–10.5)
Chloride: 105 mEq/L (ref 96–112)
Creatinine, Ser: 1.63 mg/dL — ABNORMAL HIGH (ref 0.40–1.50)
GFR: 43.54 mL/min — ABNORMAL LOW (ref >60.00)
Glucose, Bld: 124 mg/dL — ABNORMAL HIGH (ref 70–99)
PHOSPHORUS: 3.4 mg/dL (ref 2.3–4.6)
Potassium: 4.9 mEq/L (ref 3.5–5.1)
SODIUM: 139 meq/L (ref 135–145)

## 2016-05-27 LAB — PSA: PSA: 0.05 ng/mL — ABNORMAL LOW (ref 0.10–4.00)

## 2016-05-27 NOTE — Telephone Encounter (Signed)
Left a message for pt to call the office and verify which medications he actually needs refills on.

## 2016-05-27 NOTE — Telephone Encounter (Signed)
Okay to refill everything for a year eliquis should be decreased to 2.5 mg bid

## 2016-05-27 NOTE — Telephone Encounter (Signed)
Pt left note at front desk after having lab test drawn this morning requesting Dr Silvio Pate to send in his eliquis and any other meds pt may need refills on to [facility] after receives lab results. Pt seen 04/25/16 annual exam and there was note in that visit that would change Xarelto to Eliquis after labs done.

## 2016-05-30 ENCOUNTER — Encounter: Payer: Self-pay | Admitting: Internal Medicine

## 2016-05-30 NOTE — Telephone Encounter (Signed)
Dr Silvio Pate, the pt sent in a message asking for refills on all of his medications. I had called to ask for a clarification on what he meant by all of them because there were 2 Losartan rxs on his file. He said that was supposed to be your decision as to what Losartan he is supposed to be taking. Please advise so I can send in his meds. Thanks

## 2016-05-31 ENCOUNTER — Encounter: Payer: Self-pay | Admitting: Internal Medicine

## 2016-05-31 NOTE — Telephone Encounter (Signed)
I stopped the HCTZ one---I took it off his list now (forgot to at the visit)

## 2016-06-02 MED ORDER — METOPROLOL SUCCINATE ER 50 MG PO TB24: ORAL_TABLET | ORAL | 3 refills | Status: DC

## 2016-06-02 MED ORDER — LOSARTAN POTASSIUM 50 MG PO TABS: 50.0000 mg | ORAL_TABLET | Freq: Every day | ORAL | 3 refills | Status: DC

## 2016-06-02 MED ORDER — ATORVASTATIN CALCIUM 10 MG PO TABS: 10.0000 mg | ORAL_TABLET | Freq: Every day | ORAL | 3 refills | Status: DC

## 2016-06-02 MED ORDER — APIXABAN 2.5 MG PO TABS: 2.5000 mg | ORAL_TABLET | Freq: Two times a day (BID) | ORAL | 3 refills | Status: DC

## 2016-06-02 NOTE — Telephone Encounter (Signed)
Medications cleared up and rxs sent to [facility]

## 2016-06-13 ENCOUNTER — Telehealth: Payer: Self-pay | Admitting: Internal Medicine

## 2016-06-13 NOTE — Telephone Encounter (Signed)
Terrin called from [facility] regarding his medication Cozaar 50mg .  They also have a Losartin rx for the pt and are calling to confirm the change in med.  Can you please call her with a detailed message on her voicemail.  Please leave your first and last name when calling.  Number [phone].

## 2016-06-13 NOTE — Telephone Encounter (Signed)
Left message on VM that he is to be on regular losartan and not the combo

## 2016-06-13 NOTE — Telephone Encounter (Signed)
We stopped the Hyzaar (losartan-hctz) and put him on regular Cozaar(losartan)

## 2016-06-13 NOTE — Telephone Encounter (Signed)
Cozaar is losartan. I will call. I thought we already  Fixed it.

## 2016-06-18 LAB — CUP PACEART INCLINIC DEVICE CHECK
Brady Statistic RV Percent Paced: 1 %
Date Time Interrogation Session: 20171205160500
Implantable Lead Implant Date: 20060801
Implantable Lead Location: 753859
Implantable Lead Location: 753860
Implantable Lead Model: 350
Implantable Lead Serial Number: 24319010
Implantable Pulse Generator Implant Date: 20141229
Lead Channel Impedance Value: 819 Ohm
Lead Channel Pacing Threshold Amplitude: 0.3 V
Lead Channel Pacing Threshold Pulse Width: 0.4 ms
Lead Channel Sensing Intrinsic Amplitude: 12.2 mV
Lead Channel Sensing Intrinsic Amplitude: 3.9 mV
Lead Channel Setting Pacing Amplitude: 1.9 V
Lead Channel Setting Pacing Pulse Width: 0.4 ms
MDC IDC LEAD IMPLANT DT: 20060801
MDC IDC LEAD SERIAL: 24332742
MDC IDC MSMT LEADCHNL RA IMPEDANCE VALUE: 468 Ohm
MDC IDC MSMT LEADCHNL RA PACING THRESHOLD AMPLITUDE: 0.8 V
MDC IDC MSMT LEADCHNL RA PACING THRESHOLD PULSEWIDTH: 0.4 ms
MDC IDC PG SERIAL: 68145068
MDC IDC SET LEADCHNL RV PACING AMPLITUDE: 0.8 V
MDC IDC STAT BRADY RA PERCENT PACED: 99 %

## 2016-07-08 ENCOUNTER — Ambulatory Visit (INDEPENDENT_AMBULATORY_CARE_PROVIDER_SITE_OTHER): Payer: PPO | Admitting: *Deleted

## 2016-07-08 DIAGNOSIS — I495 Sick sinus syndrome: Secondary | ICD-10-CM

## 2016-07-08 LAB — CUP PACEART REMOTE DEVICE CHECK
Brady Statistic RV Percent Paced: 0 %
Date Time Interrogation Session: 20180306163710
Implantable Lead Implant Date: 20060801
Implantable Lead Location: 753860
Implantable Lead Serial Number: 24319010
Lead Channel Impedance Value: 429 Ohm
Lead Channel Impedance Value: 780 Ohm
Lead Channel Pacing Threshold Amplitude: 0.3 V
Lead Channel Pacing Threshold Pulse Width: 0.4 ms
Lead Channel Sensing Intrinsic Amplitude: 11.9 mV
MDC IDC LEAD IMPLANT DT: 20060801
MDC IDC LEAD LOCATION: 753859
MDC IDC LEAD SERIAL: 24332742
MDC IDC MSMT LEADCHNL RA PACING THRESHOLD AMPLITUDE: 0.9 V
MDC IDC MSMT LEADCHNL RA SENSING INTR AMPL: 2.9 mV
MDC IDC MSMT LEADCHNL RV PACING THRESHOLD PULSEWIDTH: 0.4 ms
MDC IDC PG IMPLANT DT: 20141229
MDC IDC SET LEADCHNL RA PACING AMPLITUDE: 1.9 V
MDC IDC SET LEADCHNL RV PACING AMPLITUDE: 0.8 V
MDC IDC SET LEADCHNL RV PACING PULSEWIDTH: 0.4 ms
MDC IDC STAT BRADY RA PERCENT PACED: 98 %
Pulse Gen Serial Number: 68145068

## 2016-07-08 NOTE — Progress Notes (Signed)
Remote pacemaker transmission.   

## 2016-07-09 ENCOUNTER — Encounter: Payer: Self-pay | Admitting: Cardiology

## 2016-10-07 ENCOUNTER — Ambulatory Visit (INDEPENDENT_AMBULATORY_CARE_PROVIDER_SITE_OTHER): Payer: PPO | Admitting: *Deleted

## 2016-10-07 DIAGNOSIS — I495 Sick sinus syndrome: Secondary | ICD-10-CM | POA: Diagnosis not present

## 2016-10-07 NOTE — Progress Notes (Signed)
Remote pacemaker transmission.   

## 2016-10-14 ENCOUNTER — Encounter: Payer: Self-pay | Admitting: Cardiology

## 2016-10-16 LAB — CUP PACEART REMOTE DEVICE CHECK
Brady Statistic AP VS Percent: 99 %
Brady Statistic AS VP Percent: 0 %
Brady Statistic AS VS Percent: 0 %
Date Time Interrogation Session: 20180614042540
Implantable Lead Implant Date: 20060801
Implantable Lead Location: 753859
Implantable Lead Serial Number: 24332742
Lead Channel Impedance Value: 436 Ohm
Lead Channel Impedance Value: 783 Ohm
Lead Channel Pacing Threshold Amplitude: 0.3 V
Lead Channel Pacing Threshold Amplitude: 0.9 V
MDC IDC LEAD IMPLANT DT: 20060801
MDC IDC LEAD LOCATION: 753860
MDC IDC LEAD SERIAL: 24319010
MDC IDC MSMT LEADCHNL RA PACING THRESHOLD PULSEWIDTH: 0.4 ms
MDC IDC MSMT LEADCHNL RV PACING THRESHOLD PULSEWIDTH: 0.4 ms
MDC IDC PG IMPLANT DT: 20141229
MDC IDC SET LEADCHNL RV PACING PULSEWIDTH: 0.4 ms
MDC IDC STAT BRADY AP VP PERCENT: 0 %
MDC IDC STAT BRADY RA PERCENT PACED: 100 %
MDC IDC STAT BRADY RV PERCENT PACED: 0 %
Pulse Gen Serial Number: 68145068

## 2016-11-26 DIAGNOSIS — Z961 Presence of intraocular lens: Secondary | ICD-10-CM | POA: Diagnosis not present

## 2016-11-26 DIAGNOSIS — L821 Other seborrheic keratosis: Secondary | ICD-10-CM | POA: Diagnosis not present

## 2017-01-02 IMAGING — CT CT HEAD WITHOUT CONTRAST
1 series · 16 of 30 positions shown, 20 images · non-contrast
Comparison: None.

CLINICAL DATA: Left arm and left leg numbness and weakness.

EXAM:
CT HEAD WITHOUT CONTRAST
TECHNIQUE: Contiguous axial images were obtained from the base of the skull
through the vertex without intravenous contrast.

[Series 2: head wo · axial · 0.40mm/px · z∈[-48,+87]mm · 16 of 30 slices shown, 20 images]
[im 2/30  brain]
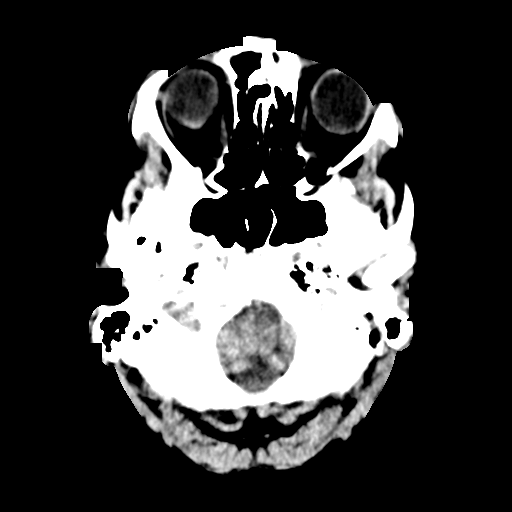
[im 2/30  bone]
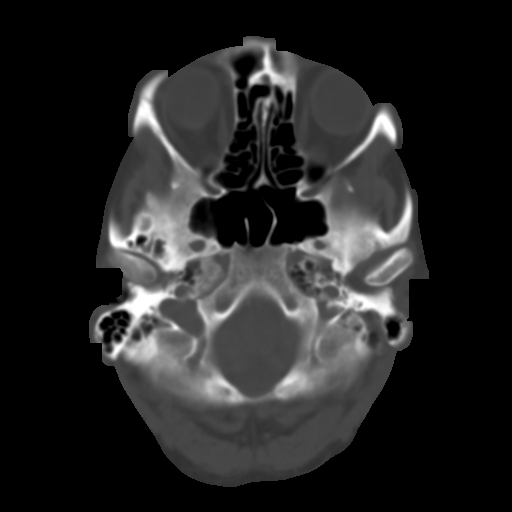
[im 4/30  brain]
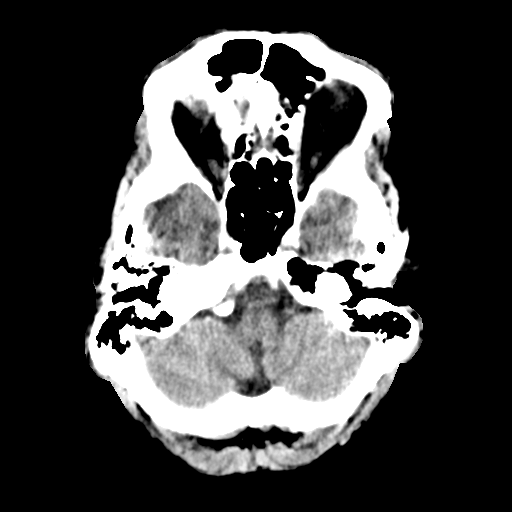
[im 6/30  brain]
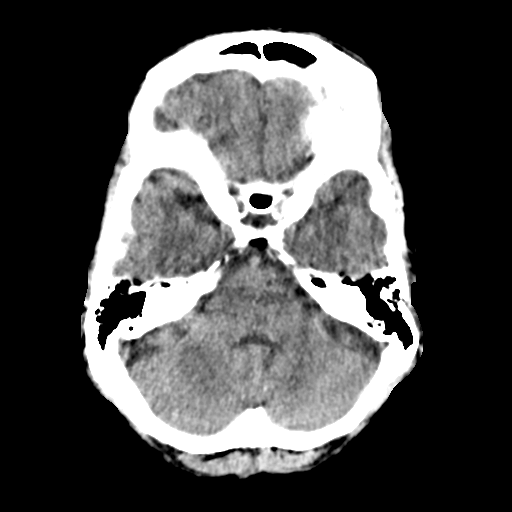
[im 8/30  brain]
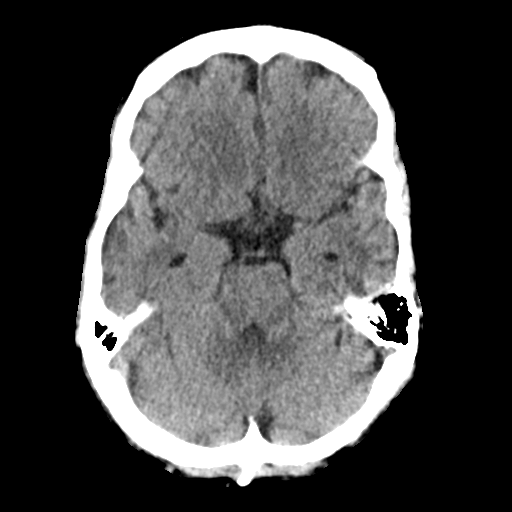
[im 9/30  brain]
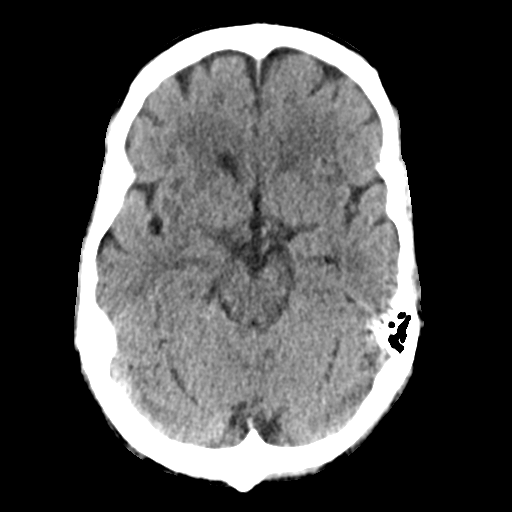
[im 9/30  bone]
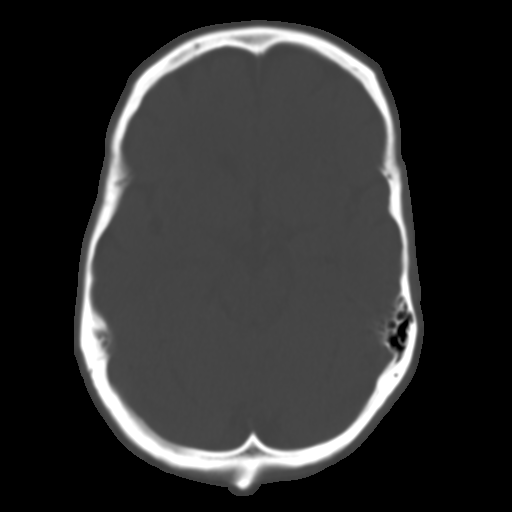
[im 11/30  brain]
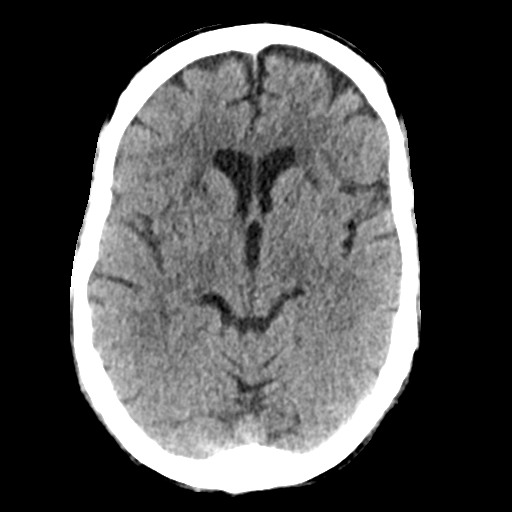
[im 13/30  brain]
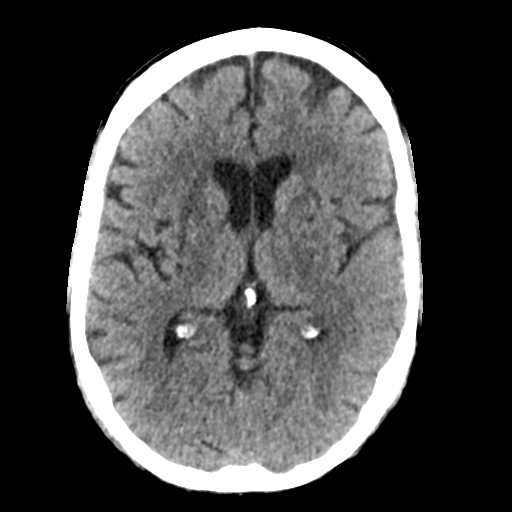
[im 15/30  brain]
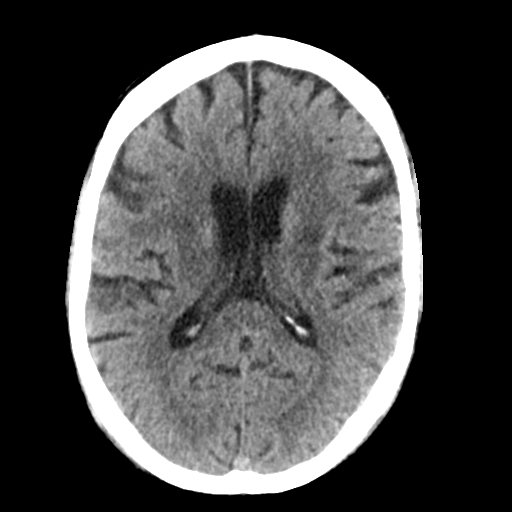
[im 16/30  brain]
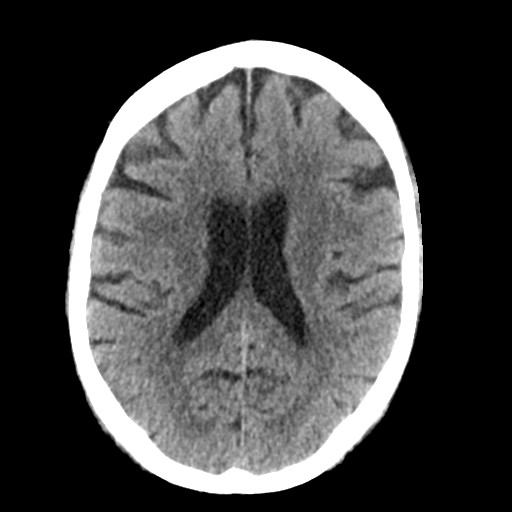
[im 16/30  bone]
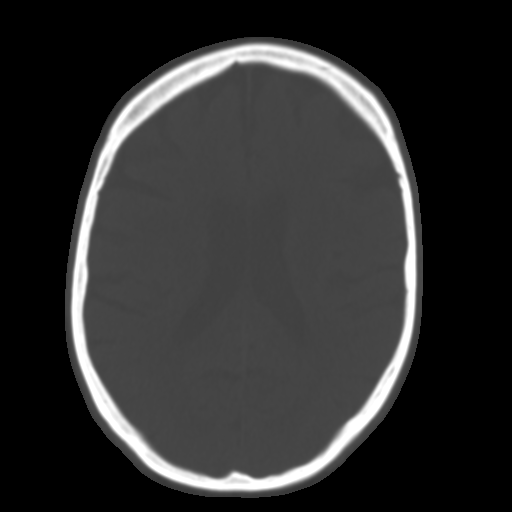
[im 18/30  brain]
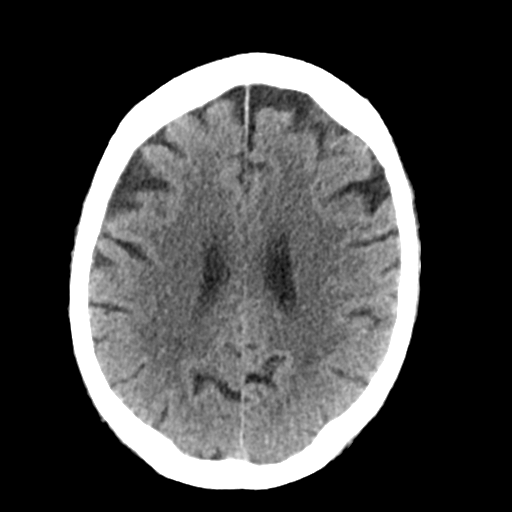
[im 20/30  brain]
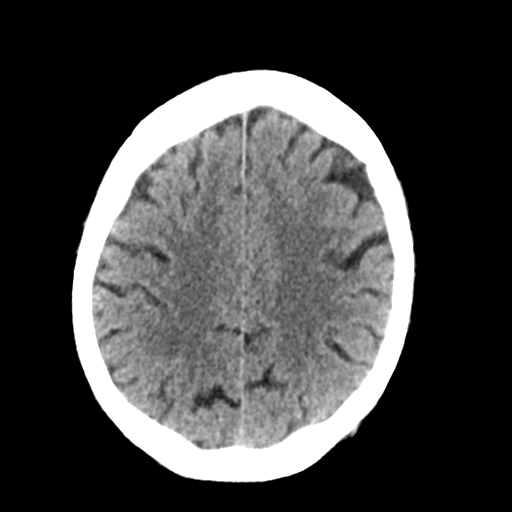
[im 22/30  brain]
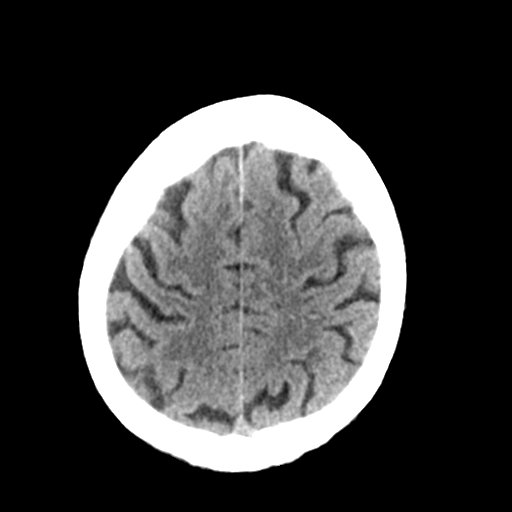
[im 23/30  brain]
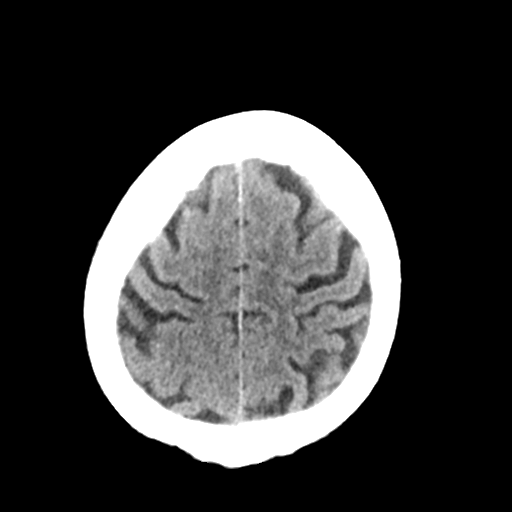
[im 23/30  bone]
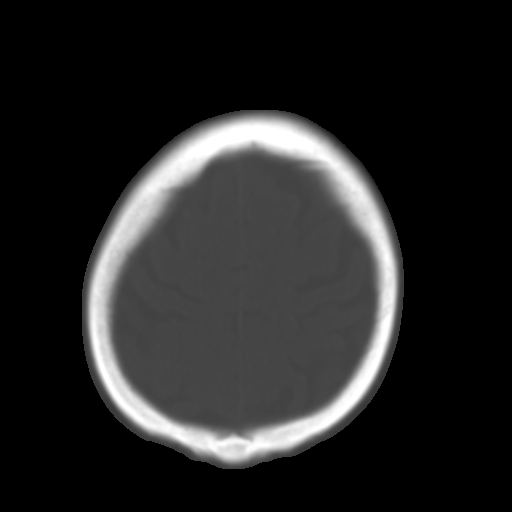
[im 25/30  brain]
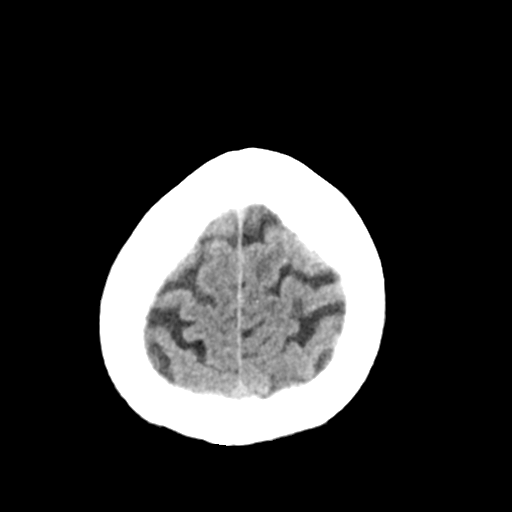
[im 27/30  brain]
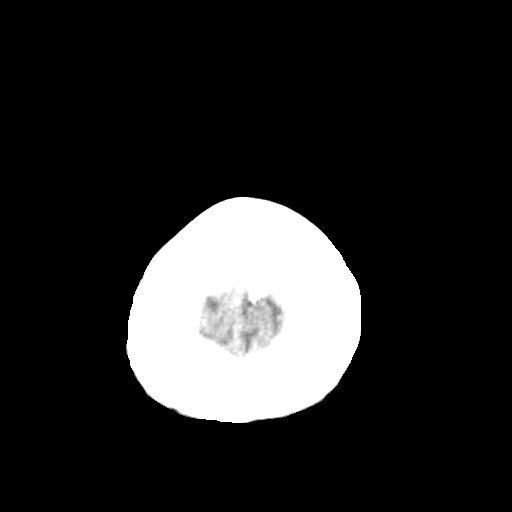
[im 29/30  brain]
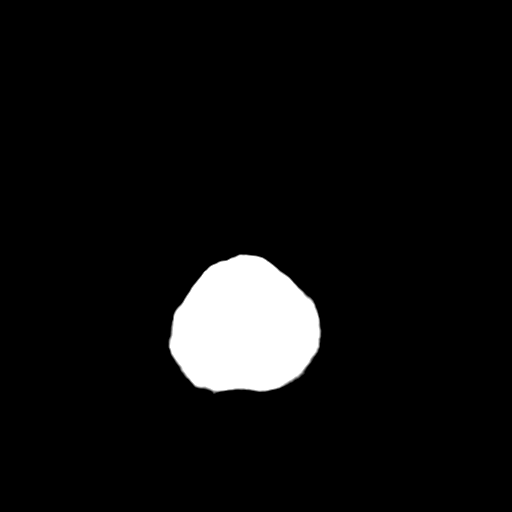

[16 of 30 positions shown; findings below may reference images not displayed]

FINDINGS: There is diffuse low attenuation throughout the subcortical and
periventricular white matter compatible with chronic microvascular
disease. Chronic bilateral basal ganglia lacunar infarcts noted. No
evidence for acute intracranial hemorrhage, acute cortical stroke or
mass. No abnormal extra-axial fluid collections identified. The
paranasal sinuses and mastoid air cells are clear. The calvarium is
intact.
IMPRESSION: 1. Chronic small vessel ischemic disease and brain atrophy.
2. No acute intracranial abnormalities identified.

## 2017-01-06 ENCOUNTER — Ambulatory Visit (INDEPENDENT_AMBULATORY_CARE_PROVIDER_SITE_OTHER): Payer: PPO | Admitting: *Deleted

## 2017-01-06 DIAGNOSIS — I495 Sick sinus syndrome: Secondary | ICD-10-CM

## 2017-01-07 NOTE — Progress Notes (Signed)
Remote pacemaker transmission.   

## 2017-01-14 ENCOUNTER — Encounter: Payer: Self-pay | Admitting: Cardiology

## 2017-01-16 LAB — CUP PACEART REMOTE DEVICE CHECK
Brady Statistic RA Percent Paced: 100 %
Implantable Lead Implant Date: 20060801
Implantable Lead Implant Date: 20060801
Implantable Lead Location: 753860
Implantable Lead Serial Number: 24319010
Implantable Lead Serial Number: 24332742
Implantable Pulse Generator Implant Date: 20141229
Lead Channel Pacing Threshold Amplitude: 0.3 V
Lead Channel Pacing Threshold Amplitude: 0.9 V
Lead Channel Pacing Threshold Pulse Width: 0.4 ms
Lead Channel Setting Pacing Pulse Width: 0.4 ms
MDC IDC LEAD LOCATION: 753859
MDC IDC MSMT LEADCHNL RA IMPEDANCE VALUE: 436 Ohm
MDC IDC MSMT LEADCHNL RA PACING THRESHOLD PULSEWIDTH: 0.4 ms
MDC IDC MSMT LEADCHNL RV IMPEDANCE VALUE: 779 Ohm
MDC IDC SESS DTM: 20180913040411
MDC IDC STAT BRADY AP VP PERCENT: 1 %
MDC IDC STAT BRADY AP VS PERCENT: 99 %
MDC IDC STAT BRADY AS VP PERCENT: 0 %
MDC IDC STAT BRADY AS VS PERCENT: 0 %
MDC IDC STAT BRADY RV PERCENT PACED: 1 %
Pulse Gen Serial Number: 68145068

## 2017-02-10 ENCOUNTER — Ambulatory Visit (INDEPENDENT_AMBULATORY_CARE_PROVIDER_SITE_OTHER): Payer: PPO

## 2017-02-10 DIAGNOSIS — Z23 Encounter for immunization: Secondary | ICD-10-CM | POA: Diagnosis not present

## 2017-04-07 ENCOUNTER — Ambulatory Visit (INDEPENDENT_AMBULATORY_CARE_PROVIDER_SITE_OTHER): Payer: PPO | Admitting: *Deleted

## 2017-04-07 DIAGNOSIS — I495 Sick sinus syndrome: Secondary | ICD-10-CM

## 2017-04-07 LAB — CUP PACEART REMOTE DEVICE CHECK
Brady Statistic AP VP Percent: 0 %
Brady Statistic AP VS Percent: 96 %
Brady Statistic AS VP Percent: 0 %
Brady Statistic AS VS Percent: 4 %
Brady Statistic RA Percent Paced: 96 %
Brady Statistic RV Percent Paced: 0 %
Date Time Interrogation Session: 20181204034518
Implantable Lead Location: 753859
Implantable Lead Location: 753860
Implantable Lead Model: 350
Implantable Lead Serial Number: 24332742
Lead Channel Impedance Value: 435 Ohm
Lead Channel Pacing Threshold Amplitude: 0.9 V
Lead Channel Pacing Threshold Pulse Width: 0.4 ms
Lead Channel Setting Pacing Pulse Width: 0.4 ms
MDC IDC LEAD IMPLANT DT: 20060801
MDC IDC LEAD IMPLANT DT: 20060801
MDC IDC LEAD SERIAL: 24319010
MDC IDC MSMT LEADCHNL RV IMPEDANCE VALUE: 772 Ohm
MDC IDC MSMT LEADCHNL RV PACING THRESHOLD AMPLITUDE: 0.3 V
MDC IDC MSMT LEADCHNL RV PACING THRESHOLD PULSEWIDTH: 0.4 ms
MDC IDC PG IMPLANT DT: 20141229
MDC IDC PG SERIAL: 68145068

## 2017-04-07 NOTE — Progress Notes (Signed)
Remote pacemaker transmission.   

## 2017-04-08 ENCOUNTER — Encounter: Payer: Self-pay | Admitting: Cardiology

## 2017-04-16 ENCOUNTER — Encounter: Payer: Self-pay | Admitting: Internal Medicine

## 2017-04-16 ENCOUNTER — Ambulatory Visit (INDEPENDENT_AMBULATORY_CARE_PROVIDER_SITE_OTHER): Payer: PPO | Admitting: Internal Medicine

## 2017-04-16 VITALS — BP 120/70 | HR 65 | Ht 65.0 in | Wt 162.8 lb

## 2017-04-16 DIAGNOSIS — I480 Paroxysmal atrial fibrillation: Secondary | ICD-10-CM | POA: Diagnosis not present

## 2017-04-16 DIAGNOSIS — Z950 Presence of cardiac pacemaker: Secondary | ICD-10-CM

## 2017-04-16 DIAGNOSIS — I495 Sick sinus syndrome: Secondary | ICD-10-CM | POA: Diagnosis not present

## 2017-04-16 MED ORDER — APIXABAN 2.5 MG PO TABS: 2.5000 mg | ORAL_TABLET | Freq: Two times a day (BID) | ORAL | 3 refills | Status: DC

## 2017-04-16 NOTE — Progress Notes (Signed)
Patient Care Team: Venia Carbon, MD as PCP - General   HPI  Jason Lambert is a 82 y.o. male Seen in followup for pacemaker implanted for sinus node dysfunction and syncope. He has a Biotronik device. He underwent  generator replacement 12/14.  The patient was found to have atrial fibrillation on his device recordings.  Anticoagulation was initiated  He takes apixoban without bleeding issues  Date Cr K Hgb  12/16 1.46 4.6 12.9  1/18 1.63 4.9 12.3    No recent evaluation of LV function   He denies any limitations in exercise capacity.  He has had no palpitations dyspnea chest pain or peripheral edema.  Has had remote syncope but cannot recall the most recent event  Past Medical History:  Diagnosis Date  . 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 Allergic rhinitis   . Atrial fibrillation ([place])   . Grave's disease   . Hyperlipidemia   . Hypertension   . Renal insufficiency   . sinus node dysfunction    s/p BTK pacemaker    Past Surgical History:  Procedure Laterality Date  . CATARACT EXTRACTION W/ INTRAOCULAR LENS  IMPLANT, BILATERAL  2012  . PACEMAKER GENERATOR CHANGE N/A 05/02/2013   Procedure: PACEMAKER GENERATOR CHANGE;  Surgeon: Deboraha Sprang, MD;  Location: [facility] CATH LAB;  Service: Cardiovascular;  Laterality: N/A;  . PACEMAKER INSERTION  8/06; 05/02/2013   Bradycardia/ syncope; BTK with generator change 05/02/2013 by Dr Caryl Comes  . Prostate Cancer  2007   RT  . TONSILLECTOMY  1947   adenoids    Current Outpatient Medications  Medication Sig Dispense Refill  . apixaban (ELIQUIS) 2.5 MG TABS tablet Take 1 tablet (2.5 mg total) by mouth 2 (two) times daily. 180 tablet 3  . atorvastatin (LIPITOR) 10 MG tablet Take 1 tablet (10 mg total) by mouth daily. 90 tablet 3  . loratadine (CLARITIN) 10 MG tablet Take 10 mg by mouth as needed for allergies.     Marland Kitchen losartan (COZAAR) 50 MG tablet Take 1 tablet (50 mg total) by mouth daily. 90 tablet 3  . metoprolol  succinate (TOPROL-XL) 50 MG 24 hr tablet TAKE 1 TABLET DAILY. TAKE  WITH OR IMMEDIATELY        FOLLOWING A MEAL. 90 tablet 3  . Multiple Vitamins-Minerals (CENTRUM SILVER PO) Take 1 tablet by mouth daily.       No current facility-administered medications for this visit.     No Known Allergies  Review of Systems negative except from HPI and PMH  Physical Exam BP 120/70 (BP Location: Left Arm, Patient Position: Sitting, Cuff Size: Normal)   Pulse 65   Ht 5\' 5"  (1.651 m)   Wt 162 lb 12 oz (73.8 kg)   BMI 27.08 kg/m  Well developed and nourished in no acute distress HENT normal Neck supple with JVP-flat Clear Regular rate and rhythm, no murmurs or gallops Abd-soft with active BS No Clubbing cyanosis edema Skin-warm and dry A & Oriented  Grossly normal sensory and motor function  ECG TO ASSESS PACEMAKER FUNCTION AND INTRINSIC RHYTHM DEMONSTRATED A paced 65 23/10/39     Assessment and  Plan  Sinus node dysfunction   Pacemaker Biotoronik. The patient's device was interrogated.  The information was reviewed.  No programming changes were made \ Atrial fibrilllation  Neurocardiogenic syncope  Hypertension   Renal insufficiency  Gd 3 (GFR 37)   Blood pressure was well controlled   NO interval  syncope  No interval atrial fibrillation  On Anticoagulation;  No bleeding issues will check hemoglobin   Renal function relatively stable.  We will reassess renal function and hemoglobin today on Eliquis

## 2017-04-16 NOTE — Patient Instructions (Addendum)
Medication Instructions: - Your physician recommends that you continue on your current medications as directed. Please refer to the Current Medication list given to you today.  Labwork: - Your physician recommends that you have lab work today: BMP/ CBC  Procedures/Testing: - none ordered  Follow-Up: - Remote monitoring is used to monitor your Pacemaker of ICD from home. This monitoring reduces the number of office visits required to check your device to one time per year. It allows [place] to keep an eye on the functioning of your device to ensure it is working properly. You are scheduled for a device check from home on 07/07/17. You may send your transmission at any time that day. If you have a wireless device, the transmission will be sent automatically. After your physician reviews your transmission, you will receive a postcard with your next transmission date.   - Your physician wants you to follow-up in: 1 year with Dr. Caryl Comes. You will receive a reminder letter in the mail two months in advance. If you don't receive a letter, please call our office to schedule the follow-up appointment.   Any Additional Special Instructions Will Be Listed Below (If Applicable).     If you need a refill on your cardiac medications before your next appointment, please call your pharmacy.

## 2017-04-17 ENCOUNTER — Telehealth: Payer: Self-pay | Admitting: Internal Medicine

## 2017-04-17 LAB — CBC WITH DIFFERENTIAL/PLATELET
BASOS: 0 %
Basophils Absolute: 0 10*3/uL (ref 0.0–0.2)
EOS (ABSOLUTE): 0 10*3/uL (ref 0.0–0.4)
EOS: 1 %
HEMATOCRIT: 37.6 % (ref 37.5–51.0)
HEMOGLOBIN: 13.4 g/dL (ref 13.0–17.7)
IMMATURE GRANS (ABS): 0 10*3/uL (ref 0.0–0.1)
IMMATURE GRANULOCYTES: 0 %
LYMPHS: 22 %
Lymphocytes Absolute: 1.4 10*3/uL (ref 0.7–3.1)
MCH: 33.2 pg — AB (ref 26.6–33.0)
MCHC: 35.6 g/dL (ref 31.5–35.7)
MCV: 93 fL (ref 79–97)
MONOCYTES: 16 %
Monocytes Absolute: 1 10*3/uL — ABNORMAL HIGH (ref 0.1–0.9)
NEUTROS ABS: 3.8 10*3/uL (ref 1.4–7.0)
NEUTROS PCT: 61 %
Platelets: 254 10*3/uL (ref 150–379)
RBC: 4.04 x10E6/uL — ABNORMAL LOW (ref 4.14–5.80)
RDW: 13.1 % (ref 12.3–15.4)
WBC: 6.2 10*3/uL (ref 3.4–10.8)

## 2017-04-17 LAB — BASIC METABOLIC PANEL
BUN/Creatinine Ratio: 14 (ref 10–24)
BUN: 20 mg/dL (ref 8–27)
CALCIUM: 9.4 mg/dL (ref 8.6–10.2)
CO2: 21 mmol/L (ref 20–29)
CREATININE: 1.45 mg/dL — AB (ref 0.76–1.27)
Chloride: 102 mmol/L (ref 96–106)
GFR, EST AFRICAN AMERICAN: 52 mL/min/{1.73_m2} — AB (ref >59)
GFR, EST NON AFRICAN AMERICAN: 45 mL/min/{1.73_m2} — AB (ref >59)
Glucose: 122 mg/dL — ABNORMAL HIGH (ref 65–99)
POTASSIUM: 5.7 mmol/L — AB (ref 3.5–5.2)
Sodium: 139 mmol/L (ref 134–144)

## 2017-04-17 NOTE — Telephone Encounter (Signed)
Notes recorded by Emily Filbert, RN on 04/17/2017 at 4:07 PM EST Reviewed elevated potassium with Dr. Caryl Comes- orders received to have the patient hold his losartan for now and have a repeat BMP early next week.  The patient is aware and agreeable. He will go to the [facility] lab on Monday or Tuesday next week for a repeat BMP. He is aware not to resume his losartan until after we call him with his results. ------  Notes recorded by Emily Filbert, RN on 04/17/2017 at 3:45 PM EST The patient is aware of his results. K- 5.7 not addressed by Dr. Caryl Comes- confirmed patient is not taking any potassium supplements, but he is on a multivitamin and losartan. I advised the patient I will touch base with Dr. Caryl Comes about this and call him back. He is agreeable. ------  Notes recorded by Deboraha Sprang, MD on 04/17/2017 at 7:57 AM EST Please Inform Patient that labs are normal wi improved renal function and blood count Thanks

## 2017-04-20 ENCOUNTER — Other Ambulatory Visit
Admission: RE | Admit: 2017-04-20 | Discharge: 2017-04-20 | Disposition: A | Discharge status: Home / Self Care | Payer: PPO | Source: Ambulatory Visit | Attending: Internal Medicine | Admitting: Internal Medicine

## 2017-04-20 ENCOUNTER — Other Ambulatory Visit: Payer: Self-pay | Admitting: *Deleted

## 2017-04-20 DIAGNOSIS — E875 Hyperkalemia: Secondary | ICD-10-CM

## 2017-04-20 LAB — BASIC METABOLIC PANEL
Anion gap: 8 (ref 5–15)
BUN: 22 mg/dL — AB (ref 6–20)
CHLORIDE: 103 mmol/L (ref 101–111)
CO2: 26 mmol/L (ref 22–32)
CREATININE: 1.51 mg/dL — AB (ref 0.61–1.24)
Calcium: 8.8 mg/dL — ABNORMAL LOW (ref 8.9–10.3)
GFR calc Af Amer: 49 mL/min — ABNORMAL LOW (ref >60)
GFR calc non Af Amer: 42 mL/min — ABNORMAL LOW (ref >60)
Glucose, Bld: 102 mg/dL — ABNORMAL HIGH (ref 65–99)
Potassium: 4.8 mmol/L (ref 3.5–5.1)
SODIUM: 137 mmol/L (ref 135–145)

## 2017-04-20 NOTE — Telephone Encounter (Signed)
Follow up     No order in Epic for labs. Patient is at [facility] to have labs. Please call [phone] [facility]

## 2017-04-20 NOTE — Telephone Encounter (Signed)
BMET order placed.

## 2017-04-22 ENCOUNTER — Telehealth: Payer: Self-pay | Admitting: Internal Medicine

## 2017-04-22 NOTE — Telephone Encounter (Signed)
Reviewed most recent lab with Dr. Caryl Comes as BMP was repeated off losartan due to hyperkalemia on 04/16/17 lab- per Dr. Caryl Comes- stay off losartan and monitor blood pressure. I have notified the patient of his results and to stay off losartan at the present time. He does have a way to check his blood pressure and will keep a log of this. I have advised him to call back if his SBP starts to run consistently over 140. He voices understanding.

## 2017-04-24 ENCOUNTER — Encounter: Payer: Self-pay | Admitting: Internal Medicine

## 2017-04-24 NOTE — Telephone Encounter (Signed)
S/w patient. He states his BP has been elevated since stopping the losartan on 04/17/17 for elevated potassium. Repeat BMET on 12/17 showed potassium nl. Dr Caryl Comes instructed to remain off losartan and monitor BP. Patient was told to call if SBP is greater than 140.  Patient report BP yesterday 158/85, HR 82 and today 153/82, HR 68 and 148/80, HR 65. Denies chest pain, shortness of breath, dizziness, palpitations, headache or blurred vision.  Advised patient I will route to provider to review BP's and plan of care.

## 2017-04-24 NOTE — Telephone Encounter (Signed)
Please have him start amlodipine 2.5 mg daily. We may need to titrate in the future if BPs still > 130's, but prefer to start low to assess response.

## 2017-04-24 NOTE — Telephone Encounter (Signed)
Patient states that his BP has been up since he was instructed to stay off losartan His last three reading were at 158, 153 and 148 Please call to discuss

## 2017-04-27 ENCOUNTER — Encounter: Payer: Medicare Other | Admitting: Internal Medicine

## 2017-05-04 DIAGNOSIS — I480 Paroxysmal atrial fibrillation: Secondary | ICD-10-CM | POA: Diagnosis not present

## 2017-05-10 ENCOUNTER — Encounter: Payer: Self-pay | Admitting: Internal Medicine

## 2017-05-17 ENCOUNTER — Encounter: Payer: Self-pay | Admitting: Internal Medicine

## 2017-06-18 ENCOUNTER — Encounter: Payer: Self-pay | Admitting: Internal Medicine

## 2017-06-18 ENCOUNTER — Ambulatory Visit (INDEPENDENT_AMBULATORY_CARE_PROVIDER_SITE_OTHER): Payer: PPO | Admitting: Internal Medicine

## 2017-06-18 VITALS — BP 136/82 | HR 92 | Temp 97.7°F | Ht 65.0 in | Wt 160.2 lb

## 2017-06-18 DIAGNOSIS — L570 Actinic keratosis: Secondary | ICD-10-CM | POA: Insufficient documentation

## 2017-06-18 DIAGNOSIS — Z23 Encounter for immunization: Secondary | ICD-10-CM | POA: Diagnosis not present

## 2017-06-18 DIAGNOSIS — I480 Paroxysmal atrial fibrillation: Secondary | ICD-10-CM | POA: Diagnosis not present

## 2017-06-18 DIAGNOSIS — I10 Essential (primary) hypertension: Secondary | ICD-10-CM

## 2017-06-18 DIAGNOSIS — N183 Chronic kidney disease, stage 3 unspecified: Secondary | ICD-10-CM

## 2017-06-18 DIAGNOSIS — Z7189 Other specified counseling: Secondary | ICD-10-CM

## 2017-06-18 DIAGNOSIS — E785 Hyperlipidemia, unspecified: Secondary | ICD-10-CM | POA: Diagnosis not present

## 2017-06-18 DIAGNOSIS — Z0000 Encounter for general adult medical examination without abnormal findings: Secondary | ICD-10-CM

## 2017-06-18 MED ORDER — METOPROLOL SUCCINATE ER 50 MG PO TB24: ORAL_TABLET | ORAL | 3 refills | Status: DC

## 2017-06-18 MED ORDER — ATORVASTATIN CALCIUM 10 MG PO TABS: 10.0000 mg | ORAL_TABLET | Freq: Every day | ORAL | 3 refills | Status: DC

## 2017-06-18 NOTE — Addendum Note (Signed)
Addended by: Pilar Grammes on: 06/18/2017 04:49 PM   Modules accepted: Orders

## 2017-06-18 NOTE — Assessment & Plan Note (Signed)
I have personally reviewed the Medicare Annual Wellness questionnaire and have noted 1. The patient's medical and social history 2. Their use of alcohol, tobacco or illicit drugs 3. Their current medications and supplements 4. The patient's functional ability including ADL's, fall risks, home safety risks and hearing or visual             impairment. 5. Diet and physical activities 6. Evidence for depression or mood disorders  The patients weight, height, BMI and visual acuity have been recorded in the chart I have made referrals, counseling and provided education to the patient based review of the above and I have provided the pt with a written personalized care plan for preventive services.  I have provided you with a copy of your personalized plan for preventive services. Please take the time to review along with your updated medication list.  Due for pneumovax booster Stays fit Yearly flu vaccine No cancer screening due to age

## 2017-06-18 NOTE — Assessment & Plan Note (Signed)
Has been stable Will just stay off the ARB since he got some hyperkalemia on it

## 2017-06-18 NOTE — Assessment & Plan Note (Signed)
Persistent AM tachycardia a while back on his arm BP monitor This has resolved Hard to tell if real or artifactual (no symptoms) On metoprolol and ellquis

## 2017-06-18 NOTE — Assessment & Plan Note (Signed)
Doing well with primary prevention Defer blood work today since recently done

## 2017-06-18 NOTE — Progress Notes (Signed)
Hearing Screening   Method: Audiometry   125Hz  250Hz  500Hz  1000Hz  2000Hz  3000Hz  4000Hz  6000Hz  8000Hz   Right ear:   20 20 20   0    Left ear:   20 20 20   0    Vision Screening Comments: Last eye exam May 2018

## 2017-06-18 NOTE — Assessment & Plan Note (Signed)
BP Readings from Last 3 Encounters:  06/18/17 136/82  04/16/17 120/70  04/25/16 112/70   BP still fine off the losartan now

## 2017-06-18 NOTE — Progress Notes (Signed)
Subjective:    Patient ID: Jason Lambert, male    DOB: [birth year], 83 y.o.   MRN: [id number]  HPI Here with wife for Medicare wellness visit and follow up of chronic health conditions Reviewed form and advanced directives Reviewed other doctors Occasional glass of wine No tobacco Continues to exercise regularly No falls No depression or anhedonia---still travels a little Vision and hearing are fine Independent with instrumental ADLs No sig memory issues  Continued changes with Dr Caryl Comes Had high potassium so losartan stopped HR over 100 at times---but not when he checked it after showering Occasionally notices his heart--no skipping No chest pain No SOB No change in exercise tolerance--maybe better off the losartan  No problems with statin No myalgias or GI problems  Voids okay Nocturia x 2 is stable  Current Outpatient Medications on File Prior to Visit  Medication Sig Dispense Refill  . apixaban (ELIQUIS) 2.5 MG TABS tablet Take 1 tablet (2.5 mg total) by mouth 2 (two) times daily. 180 tablet 3  . atorvastatin (LIPITOR) 10 MG tablet Take 1 tablet (10 mg total) by mouth daily. 90 tablet 3  . loratadine (CLARITIN) 10 MG tablet Take 10 mg by mouth as needed for allergies.     . metoprolol succinate (TOPROL-XL) 50 MG 24 hr tablet TAKE 1 TABLET DAILY. TAKE  WITH OR IMMEDIATELY        FOLLOWING A MEAL. 90 tablet 3  . Multiple Vitamins-Minerals (CENTRUM SILVER PO) Take 1 tablet by mouth daily.       No current facility-administered medications on file prior to visit.     No Known Allergies  Past Medical History:  Diagnosis Date  . 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 Allergic rhinitis   . Atrial fibrillation ([place])   . Grave's disease   . Hyperlipidemia   . Hypertension   . Renal insufficiency   . sinus node dysfunction    s/p BTK pacemaker    Past Surgical History:  Procedure Laterality Date  . CATARACT EXTRACTION W/ INTRAOCULAR LENS  IMPLANT, BILATERAL  2012   . PACEMAKER GENERATOR CHANGE N/A 05/02/2013   Procedure: PACEMAKER GENERATOR CHANGE;  Surgeon: Deboraha Sprang, MD;  Location: [facility] CATH LAB;  Service: Cardiovascular;  Laterality: N/A;  . PACEMAKER INSERTION  8/06; 05/02/2013   Bradycardia/ syncope; BTK with generator change 05/02/2013 by Dr Caryl Comes  . Prostate Cancer  2007   RT  . TONSILLECTOMY  1947   adenoids    Family History  Problem Relation Age of Onset  . Cancer Mother   . Diabetes Mother   . Coronary artery disease Father        cabg  . Hypertension Neg Hx   . Prostate cancer Neg Hx   . Colon cancer Neg Hx     Social History   Socioeconomic History  . Marital status: Married    Spouse name: Not on file  . Number of children: 2  . Years of education: Not on file  . Highest education level: Not on file  Social Needs  . Financial resource strain: Not on file  . Food insecurity - worry: Not on file  . Food insecurity - inability: Not on file  . Transportation needs - medical: Not on file  . Transportation needs - non-medical: Not on file  Occupational History  . 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 Smoking status: Never Smoker  . Smokeless tobacco: Never  Used  Substance and Sexual Activity  . Alcohol use: No    Comment: Occasional  . Drug use: No  . Sexual activity: Not on file  Other Topics Concern  . Not on file  Social History Narrative   Has a living will.    Wife to make health care decisions (then daughter [name])--formal health care POA   Would accept resuscitation   Not sure about feeding tube but no "unusual attempts"         Review of Systems Some soreness in left shoulder Sleeps well Appetite is fine Weight stable Wears seat belt Bowels are regular--no blood Some skin spots---doesn't see dermatologist. Raised spot on left chin--- 2 months No heartburn or dysphagia Teeth okay--- keeps up with dentist    Objective:   Physical Exam  Constitutional:  He is oriented to person, place, and time. He appears well-developed. No distress.  HENT:  Mouth/Throat: Oropharynx is clear and moist. No oropharyngeal exudate.  Neck: No thyromegaly present.  Cardiovascular: Normal rate, regular rhythm, normal heart sounds and intact distal pulses. Exam reveals no gallop.  No murmur heard. Pulmonary/Chest: Effort normal and breath sounds normal. No respiratory distress. He has no wheezes. He has no rales.  Abdominal: Soft. There is no tenderness.  Musculoskeletal: He exhibits no edema or tenderness.  Lymphadenopathy:    He has no cervical adenopathy.  Neurological: He is alert and oriented to person, place, and time.  President--- "Dwaine Deter, Bush" [phone] D-l-r-o-w Recall 3/3  Skin: No rash noted. No erythema.  107mm actinic vs warty type keratosis on left parietal scalp 57mm actinic lower left cheek  Psychiatric: He has a normal mood and affect. His behavior is normal.          Assessment & Plan:

## 2017-06-18 NOTE — Assessment & Plan Note (Signed)
Discussed treatment pros and cons Verbal consent  Liquid nitrogen 30 seconds to lesions x 2 Tolerated well Discussed home care

## 2017-06-18 NOTE — Assessment & Plan Note (Signed)
See social history 

## 2017-07-07 ENCOUNTER — Ambulatory Visit (INDEPENDENT_AMBULATORY_CARE_PROVIDER_SITE_OTHER): Payer: PPO | Admitting: *Deleted

## 2017-07-07 DIAGNOSIS — I495 Sick sinus syndrome: Secondary | ICD-10-CM

## 2017-07-07 NOTE — Progress Notes (Signed)
Remote pacemaker transmission.   

## 2017-07-08 ENCOUNTER — Encounter: Payer: Self-pay | Admitting: Cardiology

## 2017-07-12 LAB — CUP PACEART REMOTE DEVICE CHECK
Implantable Lead Implant Date: 20060801
Implantable Lead Location: 753859
Implantable Lead Model: 350
Implantable Lead Serial Number: 24332742
MDC IDC LEAD IMPLANT DT: 20060801
MDC IDC LEAD LOCATION: 753860
MDC IDC LEAD SERIAL: 24319010
MDC IDC PG IMPLANT DT: 20141229
MDC IDC PG SERIAL: 68145068
MDC IDC SESS DTM: 20190310113305

## 2017-08-23 ENCOUNTER — Other Ambulatory Visit: Payer: Self-pay

## 2017-08-23 ENCOUNTER — Encounter: Payer: Self-pay | Admitting: Emergency Medicine

## 2017-08-23 ENCOUNTER — Emergency Department
Admission: EM | Admit: 2017-08-23 | Discharge: 2017-08-23 | Disposition: A | Discharge status: Home / Self Care | Payer: PPO | Attending: Emergency Medicine | Admitting: Emergency Medicine

## 2017-08-23 DIAGNOSIS — R112 Nausea with vomiting, unspecified: Secondary | ICD-10-CM | POA: Insufficient documentation

## 2017-08-23 DIAGNOSIS — Z950 Presence of cardiac pacemaker: Secondary | ICD-10-CM | POA: Insufficient documentation

## 2017-08-23 DIAGNOSIS — Z8546 Personal history of malignant neoplasm of prostate: Secondary | ICD-10-CM | POA: Diagnosis not present

## 2017-08-23 DIAGNOSIS — I129 Hypertensive chronic kidney disease with stage 1 through stage 4 chronic kidney disease, or unspecified chronic kidney disease: Secondary | ICD-10-CM | POA: Diagnosis not present

## 2017-08-23 DIAGNOSIS — Z7901 Long term (current) use of anticoagulants: Secondary | ICD-10-CM | POA: Diagnosis not present

## 2017-08-23 DIAGNOSIS — N183 Chronic kidney disease, stage 3 (moderate): Secondary | ICD-10-CM | POA: Insufficient documentation

## 2017-08-23 DIAGNOSIS — Z8679 Personal history of other diseases of the circulatory system: Secondary | ICD-10-CM | POA: Diagnosis not present

## 2017-08-23 DIAGNOSIS — Z79899 Other long term (current) drug therapy: Secondary | ICD-10-CM | POA: Diagnosis not present

## 2017-08-23 DIAGNOSIS — R197 Diarrhea, unspecified: Secondary | ICD-10-CM | POA: Insufficient documentation

## 2017-08-23 LAB — CBC
HCT: 38.2 % — ABNORMAL LOW (ref 40.0–52.0)
Hemoglobin: 13.5 g/dL (ref 13.0–18.0)
MCH: 33.8 pg (ref 26.0–34.0)
MCHC: 35.3 g/dL (ref 32.0–36.0)
MCV: 95.8 fL (ref 80.0–100.0)
PLATELETS: 195 10*3/uL (ref 150–440)
RBC: 3.98 MIL/uL — ABNORMAL LOW (ref 4.40–5.90)
RDW: 12.8 % (ref 11.5–14.5)
WBC: 4.7 10*3/uL (ref 3.8–10.6)

## 2017-08-23 LAB — COMPREHENSIVE METABOLIC PANEL
ALK PHOS: 78 U/L (ref 38–126)
ALT: 46 U/L (ref 17–63)
AST: 32 U/L (ref 15–41)
Albumin: 3.9 g/dL (ref 3.5–5.0)
Anion gap: 5 (ref 5–15)
BILIRUBIN TOTAL: 0.7 mg/dL (ref 0.3–1.2)
BUN: 22 mg/dL — AB (ref 6–20)
CALCIUM: 7.7 mg/dL — AB (ref 8.9–10.3)
CHLORIDE: 106 mmol/L (ref 101–111)
CO2: 25 mmol/L (ref 22–32)
CREATININE: 1.52 mg/dL — AB (ref 0.61–1.24)
GFR, EST AFRICAN AMERICAN: 48 mL/min — AB (ref >60)
GFR, EST NON AFRICAN AMERICAN: 42 mL/min — AB (ref >60)
Glucose, Bld: 124 mg/dL — ABNORMAL HIGH (ref 65–99)
Potassium: 3.9 mmol/L (ref 3.5–5.1)
Sodium: 136 mmol/L (ref 135–145)
Total Protein: 7 g/dL (ref 6.5–8.1)

## 2017-08-23 LAB — URINALYSIS, COMPLETE (UACMP) WITH MICROSCOPIC
BILIRUBIN URINE: NEGATIVE
Glucose, UA: NEGATIVE mg/dL
Hgb urine dipstick: NEGATIVE
KETONES UR: NEGATIVE mg/dL
LEUKOCYTES UA: NEGATIVE
Nitrite: NEGATIVE
PH: 7 (ref 5.0–8.0)
Protein, ur: NEGATIVE mg/dL
Specific Gravity, Urine: 1.005 (ref 1.005–1.030)

## 2017-08-23 LAB — LIPASE, BLOOD: Lipase: 52 U/L — ABNORMAL HIGH (ref 11–51)

## 2017-08-23 MED ORDER — SODIUM CHLORIDE 0.9 % IV BOLUS
1000.0000 mL | Freq: Once | INTRAVENOUS | Status: AC
  Administered 2017-08-23: 1000 mL via INTRAVENOUS

## 2017-08-23 MED ORDER — ONDANSETRON HCL 4 MG PO TABS: 4.0000 mg | ORAL_TABLET | Freq: Three times a day (TID) | ORAL | 0 refills | Status: DC | PRN

## 2017-08-23 NOTE — ED Notes (Signed)
Pt ambulatory upon discharge; verbalized understanding of discharge instructions, follow-up care and prescription. VSS. Skin warm and dry. A&O x4.  

## 2017-08-23 NOTE — ED Triage Notes (Signed)
Pt reports sxs of N/V/D for the past few days, pt states he has not had any diarrhea today, but states stools are soft, pt states he has not vomited since Friday. Pt states he recently traveled and states he was eating foods he does not usually eat while on a trip on the [facility].   Pt denies any pain, but has had decreased appetite and able to drink some liquids.

## 2017-08-23 NOTE — ED Provider Notes (Signed)
[facility] Emergency Department Provider Note  ____________________________________________   I have reviewed the triage vital signs and the nursing notes. Where available I have reviewed prior notes and, if possible and indicated, outside hospital notes.    HISTORY  Chief Complaint Nausea and Emesis    HPI Jason Lambert is a 83 y.o. male who presents today complaining of nausea vomiting and diarrhea.  He states he has not actually vomited in 2 days, he has not had any diarrhea of any significance for couple days but his stools are still loose.  He denies any abdominal pain.  He denies any fever chills or recent antibiotics.  He states that he felt slightly dehydrated and he wanted to come in here for IV fluid.  No other complaints.  No melena no bright red blood per rectum no hematemesis.    Past Medical History:  Diagnosis Date  . 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 Allergic rhinitis   . Atrial fibrillation ([place])   . Grave's disease   . Hyperlipidemia   . Hypertension   . Renal insufficiency   . sinus node dysfunction    s/p BTK pacemaker    Patient Active Problem List   Diagnosis Date Noted  . Actinic keratoses 06/18/2017  . Atrial fibrillation ([place])   . Seborrheic keratoses 08/30/2014  . Advanced directives, counseling/discussion 04/12/2014  . sinus node dysfunction   . Pacemaker - BIOTRONIK   . Routine general medical examination at a health care facility 06/24/2011  . Chronic kidney disease, stage III (moderate) ([place]) 07/19/2008  . PROSTATE CANCER, HX OF 01/11/2008  . Graves disease 01/06/2007  . Hyperlipemia 01/06/2007  . Essential hypertension, benign 01/06/2007  . ALLERGIC RHINITIS 01/06/2007    Past Surgical History:  Procedure Laterality Date  . CATARACT EXTRACTION W/ INTRAOCULAR LENS  IMPLANT, BILATERAL  2012  . PACEMAKER GENERATOR CHANGE N/A 05/02/2013   Procedure: PACEMAKER GENERATOR CHANGE;  Surgeon: Deboraha Sprang, MD;   Location: [facility] CATH LAB;  Service: Cardiovascular;  Laterality: N/A;  . PACEMAKER INSERTION  8/06; 05/02/2013   Bradycardia/ syncope; BTK with generator change 05/02/2013 by Dr Caryl Comes  . Prostate Cancer  2007   RT  . TONSILLECTOMY  1947   adenoids    Prior to Admission medications   Medication Sig Start Date End Date Taking? Authorizing Provider  apixaban (ELIQUIS) 2.5 MG TABS tablet Take 1 tablet (2.5 mg total) by mouth 2 (two) times daily. 04/16/17  Yes Deboraha Sprang, MD  atorvastatin (LIPITOR) 10 MG tablet Take 1 tablet (10 mg total) by mouth daily. 06/18/17  Yes Venia Carbon, MD  loratadine (CLARITIN) 10 MG tablet Take 10 mg by mouth daily.    Yes [provider]  metoprolol succinate (TOPROL-XL) 50 MG 24 hr tablet TAKE 1 TABLET DAILY. TAKE  WITH OR IMMEDIATELY        FOLLOWING A MEAL. 06/18/17  Yes Venia Carbon, MD  Multiple Vitamins-Minerals (CENTRUM SILVER PO) Take 1 tablet by mouth daily.     Yes [provider]    Allergies Patient has no known allergies.  Family History  Problem Relation Age of Onset  . Cancer Mother   . Diabetes Mother   . Coronary artery disease Father        cabg  . Hypertension Neg Hx   . Prostate cancer Neg Hx   . Colon cancer Neg Hx     Social History Social History   Tobacco Use  .  Smoking status: Never Smoker  . Smokeless tobacco: Never Used  Substance Use Topics  . Alcohol use: No    Comment: Occasional  . Drug use: No    Review of Systems Constitutional: No fever/chills Eyes: No visual changes. ENT: No sore throat. No stiff neck no neck pain Cardiovascular: Denies chest pain. Respiratory: Denies shortness of breath. Gastrointestinal:   .  No constipation. Genitourinary: Negative for dysuria. Musculoskeletal: Negative lower extremity swelling Skin: Negative for rash. Neurological: Negative for severe headaches, focal weakness or numbness.   ____________________________________________   PHYSICAL  EXAM:  VITAL SIGNS: ED Triage Vitals  Enc Vitals Group     BP 08/23/17 1011 (!) 160/84     Pulse Rate 08/23/17 1011 64     Resp 08/23/17 1011 18     Temp 08/23/17 1011 98 F (36.7 C)     Temp Source 08/23/17 1011 Oral     SpO2 08/23/17 1011 99 %     Weight --      Height --      Head Circumference --      Peak Flow --      Pain Score 08/23/17 1021 0     Pain Loc --      Pain Edu? --      Excl. in [place]? --     Constitutional: Alert and oriented. Well appearing and in no acute distress. Eyes: Conjunctivae are normal Head: Atraumatic HEENT: No congestion/rhinnorhea. Mucous membranes are moist.  Oropharynx non-erythematous Neck:   Nontender with no meningismus, no masses, no stridor Cardiovascular: Normal rate, regular rhythm. Grossly normal heart sounds.  Good peripheral circulation. Respiratory: Normal respiratory effort.  No retractions. Lungs CTAB. Abdominal: Soft and nontender. No distention. No guarding no rebound Back:  There is no focal tenderness or step off.  there is no midline tenderness there are no lesions noted. there is no CVA tenderness Musculoskeletal: No lower extremity tenderness, no upper extremity tenderness. No joint effusions, no DVT signs strong distal pulses no edema Neurologic:  Normal speech and language. No gross focal neurologic deficits are appreciated.  Skin:  Skin is warm, dry and intact. No rash noted. Psychiatric: Mood and affect are normal. Speech and behavior are normal.  ____________________________________________   LABS (all labs ordered are listed, but only abnormal results are displayed)  Labs Reviewed  LIPASE, BLOOD - Abnormal; Notable for the following components:      Result Value   Lipase 52 (*)    All other components within normal limits  COMPREHENSIVE METABOLIC PANEL - Abnormal; Notable for the following components:   Glucose, Bld 124 (*)    BUN 22 (*)    Creatinine, Ser 1.52 (*)    Calcium 7.7 (*)    GFR calc non Af Amer  42 (*)    GFR calc Af Amer 48 (*)    All other components within normal limits  CBC - Abnormal; Notable for the following components:   RBC 3.98 (*)    HCT 38.2 (*)    All other components within normal limits  URINALYSIS, COMPLETE (UACMP) WITH MICROSCOPIC    Pertinent labs  results that were available during my care of the patient were reviewed by me and considered in my medical decision making (see chart for details). ____________________________________________  EKG  I personally interpreted any EKGs ordered by me or triage  ____________________________________________  RADIOLOGY  Pertinent labs & imaging results that were available during my care of the patient were reviewed by me  and considered in my medical decision making (see chart for details). If possible, patient and/or family made aware of any abnormal findings.  No results found. ____________________________________________    PROCEDURES  Procedure(s) performed: None  Procedures  Critical Care performed: None  ____________________________________________   INITIAL IMPRESSION / ASSESSMENT AND PLAN / ED COURSE  Pertinent labs & imaging results that were available during my care of the patient were reviewed by me and considered in my medical decision making (see chart for details).  Very well-appearing gentleman with nausea vomiting diarrhea which is now resolved but he still feels somewhat lightheaded at home.  Vital signs are reassuring blood work is reassuring, we will give him IV fluids and see if he feels better.  Abdomen is completely benign.  Do not think this represents ACS PE or dissection or AAA etc.  Nothing to suggest ischemic gut.    ____________________________________________   FINAL CLINICAL IMPRESSION(S) / ED DIAGNOSES  Final diagnoses:  None      This chart was dictated using voice recognition software.  Despite best efforts to proofread,  errors can occur which can change meaning.       Schuyler Amor, MD 08/23/17 [phone]

## 2017-08-23 NOTE — ED Notes (Signed)
Pt unable to urinate at this time. Understands we need urine specimen.

## 2017-08-25 ENCOUNTER — Telehealth: Payer: Self-pay

## 2017-08-25 NOTE — Telephone Encounter (Signed)
Spoke to pt. He said he is feeling really good.

## 2017-10-06 ENCOUNTER — Ambulatory Visit (INDEPENDENT_AMBULATORY_CARE_PROVIDER_SITE_OTHER): Payer: PPO | Admitting: *Deleted

## 2017-10-06 DIAGNOSIS — I495 Sick sinus syndrome: Secondary | ICD-10-CM | POA: Diagnosis not present

## 2017-10-06 NOTE — Progress Notes (Signed)
Remote pacemaker transmission.   

## 2017-11-10 LAB — CUP PACEART REMOTE DEVICE CHECK
Brady Statistic AP VP Percent: 0 %
Brady Statistic AP VS Percent: 97 %
Brady Statistic AS VP Percent: 0 %
Brady Statistic AS VS Percent: 2 %
Brady Statistic RV Percent Paced: 0 %
Implantable Lead Implant Date: 20060801
Implantable Lead Serial Number: 24319010
Lead Channel Impedance Value: 671 Ohm
Lead Channel Pacing Threshold Amplitude: 0.3 V
Lead Channel Pacing Threshold Amplitude: 0.9 V
Lead Channel Pacing Threshold Pulse Width: 0.4 ms
MDC IDC LEAD IMPLANT DT: 20060801
MDC IDC LEAD LOCATION: 753859
MDC IDC LEAD LOCATION: 753860
MDC IDC LEAD SERIAL: 24332742
MDC IDC MSMT BATTERY REMAINING PERCENTAGE: 70 %
MDC IDC MSMT LEADCHNL RA IMPEDANCE VALUE: 426 Ohm
MDC IDC MSMT LEADCHNL RV PACING THRESHOLD PULSEWIDTH: 0.4 ms
MDC IDC PG IMPLANT DT: 20141229
MDC IDC SESS DTM: 20190709052137
MDC IDC SET LEADCHNL RV PACING PULSEWIDTH: 0.4 ms
MDC IDC STAT BRADY RA PERCENT PACED: 98 %
Pulse Gen Serial Number: 68145068

## 2017-12-28 DIAGNOSIS — Z961 Presence of intraocular lens: Secondary | ICD-10-CM | POA: Diagnosis not present

## 2018-01-05 ENCOUNTER — Ambulatory Visit (INDEPENDENT_AMBULATORY_CARE_PROVIDER_SITE_OTHER): Payer: PPO | Admitting: *Deleted

## 2018-01-05 DIAGNOSIS — I495 Sick sinus syndrome: Secondary | ICD-10-CM

## 2018-01-05 NOTE — Progress Notes (Signed)
Remote pacemaker transmission.   

## 2018-01-26 LAB — CUP PACEART REMOTE DEVICE CHECK
Implantable Lead Implant Date: 20060801
Implantable Lead Location: 753859
Implantable Lead Model: 350
Implantable Lead Serial Number: 24332742
Implantable Pulse Generator Implant Date: 20141229
MDC IDC LEAD IMPLANT DT: 20060801
MDC IDC LEAD LOCATION: 753860
MDC IDC LEAD SERIAL: 24319010
MDC IDC PG SERIAL: 68145068
MDC IDC SESS DTM: 20190924102238

## 2018-01-28 ENCOUNTER — Ambulatory Visit (INDEPENDENT_AMBULATORY_CARE_PROVIDER_SITE_OTHER): Payer: PPO

## 2018-01-28 DIAGNOSIS — Z23 Encounter for immunization: Secondary | ICD-10-CM | POA: Diagnosis not present

## 2018-04-06 ENCOUNTER — Ambulatory Visit (INDEPENDENT_AMBULATORY_CARE_PROVIDER_SITE_OTHER): Payer: PPO

## 2018-04-06 DIAGNOSIS — I495 Sick sinus syndrome: Secondary | ICD-10-CM

## 2018-04-06 NOTE — Progress Notes (Signed)
Remote pacemaker transmission.   

## 2018-04-13 ENCOUNTER — Encounter: Payer: Self-pay | Admitting: Cardiology

## 2018-05-27 LAB — CUP PACEART REMOTE DEVICE CHECK
Date Time Interrogation Session: 20200123171027
Implantable Lead Implant Date: 20060801
Implantable Lead Location: 753859
Implantable Lead Location: 753860
Implantable Lead Serial Number: 24319010
MDC IDC LEAD IMPLANT DT: 20060801
MDC IDC LEAD SERIAL: 24332742
MDC IDC PG IMPLANT DT: 20141229
MDC IDC PG SERIAL: 68145068

## 2018-05-28 ENCOUNTER — Other Ambulatory Visit: Payer: Self-pay | Admitting: Internal Medicine

## 2018-06-08 ENCOUNTER — Encounter: Payer: Self-pay | Admitting: Internal Medicine

## 2018-06-08 ENCOUNTER — Ambulatory Visit: Payer: PPO | Admitting: Internal Medicine

## 2018-06-08 VITALS — BP 138/76 | HR 79 | Ht 65.0 in | Wt 164.0 lb

## 2018-06-08 DIAGNOSIS — I480 Paroxysmal atrial fibrillation: Secondary | ICD-10-CM

## 2018-06-08 DIAGNOSIS — Z79899 Other long term (current) drug therapy: Secondary | ICD-10-CM

## 2018-06-08 DIAGNOSIS — I495 Sick sinus syndrome: Secondary | ICD-10-CM

## 2018-06-08 DIAGNOSIS — Z950 Presence of cardiac pacemaker: Secondary | ICD-10-CM

## 2018-06-08 MED ORDER — APIXABAN 2.5 MG PO TABS: 2.5000 mg | ORAL_TABLET | Freq: Two times a day (BID) | ORAL | 3 refills | Status: DC

## 2018-06-08 NOTE — Progress Notes (Signed)
° ° ° ° °  Patient Care Team: Venia Carbon, MD as PCP - General   HPI  Jason Lambert is a 84 y.o. maleSeen in followup for pacemaker implanted for sinus node dysfunction and syncope. He has a Biotronik device. He underwent  generator replacement 12/14.  The patient was found to have atrial fibrillation on his device recordings.  Anticoagulation was initiated w apixoban    no bleeding  Date Cr K Hgb  12/16 1.46 4.6 12.9  1/18 1.63 4.9 12.3  4/19 1.52 3.9 13.5           The patient denies chest pain, shortness of breath, nocturnal dyspnea, orthopnea or peripheral edema.  There have been no palpitations, lightheadedness or syncope.    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   Allergic rhinitis    Atrial fibrillation ([place])    Grave's disease    Hyperlipidemia    Hypertension    Renal insufficiency    sinus node dysfunction    s/p BTK pacemaker    Past Surgical History:  Procedure Laterality Date   CATARACT EXTRACTION W/ INTRAOCULAR LENS  IMPLANT, BILATERAL  2012   PACEMAKER GENERATOR CHANGE N/A 05/02/2013   Procedure: PACEMAKER GENERATOR CHANGE;  Surgeon: Deboraha Sprang, MD;  Location: [facility] CATH LAB;  Service: Cardiovascular;  Laterality: N/A;   PACEMAKER INSERTION  8/06; 05/02/2013   Bradycardia/ syncope; BTK with generator change 05/02/2013 by Dr Caryl Comes   Prostate Cancer  2007   RT   TONSILLECTOMY  1947   adenoids    Current Outpatient Medications  Medication Sig Dispense Refill   apixaban (ELIQUIS) 2.5 MG TABS tablet Take 1 tablet (2.5 mg total) by mouth 2 (two) times daily. 180 tablet 3   atorvastatin (LIPITOR) 10 MG tablet Take 1 tablet by mouth every day 90 tablet 0   loratadine (CLARITIN) 10 MG tablet Take 10 mg by mouth daily.      metoprolol succinate (TOPROL-XL) 50 MG 24 hr tablet Take 1 tablet by mouth every day with a meal or immediately following a meal. 90 tablet 0   Multiple Vitamins-Minerals  (CENTRUM SILVER PO) Take 1 tablet by mouth daily.       No current facility-administered medications for this visit.     No Known Allergies  Review of Systems negative except from HPI and PMH  Physical Exam BP 138/76 (BP Location: Left Arm, Patient Position: Sitting, Cuff Size: Normal)    Pulse 79    Ht 5\' 5"  (1.651 m)    Wt 164 lb (74.4 kg)    BMI 27.29 kg/m  Well developed and nourished in no acute distress HENT normal Neck supple with JVP-flat Clear Regular rate and rhythm, soft S1  no murmurs or gallops Abd-soft with active BS No Clubbing cyanosis edema Skin-warm and dry A & Oriented  Grossly normal sensory and motor function  ECG atrial pacing at 79 Interval 26/10/38  Assessment and  Plan  Sinus node dysfunction   Pacemaker Biotoronik. The patient's device was interrogated.  The information was reviewed. No changes were made in the programming.     \ Atrial fibrillation  Neurocardiogenic syncope  Hypertension  Renal insufficiency  Gd 3 (GFR 37)   BP well controlled  On Anticoagulation;  No bleeding issues   Will check surveillance labs  No Interval syncope

## 2018-06-08 NOTE — Patient Instructions (Signed)
Medication Instructions:  - Your physician recommends that you continue on your current medications as directed. Please refer to the Current Medication list given to you today.  If you need a refill on your cardiac medications before your next appointment, please call your pharmacy.   Lab work: - Your physician recommends that you have lab work today: BMP/ CBC  If you have labs (blood work) drawn today and your tests are completely normal, you will receive your results only by: Marland Kitchen MyChart Message (if you have MyChart) OR . A paper copy in the mail If you have any lab test that is abnormal or we need to change your treatment, we will call you to review the results.  Testing/Procedures: - none ordered  Follow-Up: At [facility], you and your health needs are our priority.  As part of our continuing mission to provide you with exceptional heart care, we have created designated Provider Care Teams.  These Care Teams include your primary Cardiologist (physician) and Advanced Practice Providers (APPs -  Physician Assistants and Nurse Practitioners) who all work together to provide you with the care you need, when you need it. . You will need a follow up appointment in 1 year with Dr. Caryl Comes.  Please call our office 2 months in advance to schedule this appointment.    Remote monitoring is used to monitor your Pacemaker of ICD from home. This monitoring reduces the number of office visits required to check your device to one time per year. It allows [place] to keep an eye on the functioning of your device to ensure it is working properly. You are scheduled for a device check from home on 07/06/2018. You may send your transmission at any time that day. If you have a wireless device, the transmission will be sent automatically. After your physician reviews your transmission, you will receive a postcard with your next transmission date.  Any Other Special Instructions Will Be Listed Below (If Applicable). -  N/A

## 2018-06-09 LAB — CBC WITH DIFFERENTIAL/PLATELET
BASOS ABS: 0 10*3/uL (ref 0.0–0.2)
Basos: 1 %
EOS (ABSOLUTE): 0.1 10*3/uL (ref 0.0–0.4)
EOS: 1 %
HEMATOCRIT: 35.3 % — AB (ref 37.5–51.0)
HEMOGLOBIN: 12.6 g/dL — AB (ref 13.0–17.7)
IMMATURE GRANULOCYTES: 0 %
Immature Grans (Abs): 0 10*3/uL (ref 0.0–0.1)
LYMPHS: 20 %
Lymphocytes Absolute: 1.2 10*3/uL (ref 0.7–3.1)
MCH: 33.7 pg — ABNORMAL HIGH (ref 26.6–33.0)
MCHC: 35.7 g/dL (ref 31.5–35.7)
MCV: 94 fL (ref 79–97)
MONOCYTES: 18 %
Monocytes Absolute: 1.1 10*3/uL — ABNORMAL HIGH (ref 0.1–0.9)
NEUTROS PCT: 60 %
Neutrophils Absolute: 3.7 10*3/uL (ref 1.4–7.0)
Platelets: 207 10*3/uL (ref 150–450)
RBC: 3.74 x10E6/uL — ABNORMAL LOW (ref 4.14–5.80)
RDW: 12.3 % (ref 11.6–15.4)
WBC: 6.1 10*3/uL (ref 3.4–10.8)

## 2018-06-09 LAB — BASIC METABOLIC PANEL
BUN/Creatinine Ratio: 18 (ref 10–24)
BUN: 26 mg/dL (ref 8–27)
CALCIUM: 9.2 mg/dL (ref 8.6–10.2)
CO2: 21 mmol/L (ref 20–29)
CREATININE: 1.44 mg/dL — AB (ref 0.76–1.27)
Chloride: 104 mmol/L (ref 96–106)
GFR calc Af Amer: 52 mL/min/{1.73_m2} — ABNORMAL LOW (ref >59)
GFR calc non Af Amer: 45 mL/min/{1.73_m2} — ABNORMAL LOW (ref >59)
Glucose: 94 mg/dL (ref 65–99)
Potassium: 4.7 mmol/L (ref 3.5–5.2)
Sodium: 139 mmol/L (ref 134–144)

## 2018-06-10 ENCOUNTER — Other Ambulatory Visit: Payer: Self-pay | Admitting: *Deleted

## 2018-06-10 MED ORDER — APIXABAN 2.5 MG PO TABS: 2.5000 mg | ORAL_TABLET | Freq: Two times a day (BID) | ORAL | 3 refills | Status: DC

## 2018-06-23 ENCOUNTER — Encounter: Payer: Self-pay | Admitting: Internal Medicine

## 2018-06-23 ENCOUNTER — Ambulatory Visit (INDEPENDENT_AMBULATORY_CARE_PROVIDER_SITE_OTHER): Payer: PPO | Admitting: Internal Medicine

## 2018-06-23 VITALS — BP 114/84 | HR 70 | Temp 98.0°F | Ht 65.0 in | Wt 159.0 lb

## 2018-06-23 DIAGNOSIS — I480 Paroxysmal atrial fibrillation: Secondary | ICD-10-CM | POA: Diagnosis not present

## 2018-06-23 DIAGNOSIS — I10 Essential (primary) hypertension: Secondary | ICD-10-CM | POA: Diagnosis not present

## 2018-06-23 DIAGNOSIS — Z0000 Encounter for general adult medical examination without abnormal findings: Secondary | ICD-10-CM

## 2018-06-23 DIAGNOSIS — N401 Enlarged prostate with lower urinary tract symptoms: Secondary | ICD-10-CM | POA: Diagnosis not present

## 2018-06-23 DIAGNOSIS — N138 Other obstructive and reflux uropathy: Secondary | ICD-10-CM

## 2018-06-23 DIAGNOSIS — N183 Chronic kidney disease, stage 3 unspecified: Secondary | ICD-10-CM

## 2018-06-23 DIAGNOSIS — Z7189 Other specified counseling: Secondary | ICD-10-CM

## 2018-06-23 MED ORDER — METOPROLOL SUCCINATE ER 50 MG PO TB24: ORAL_TABLET | ORAL | 3 refills | Status: DC

## 2018-06-23 MED ORDER — ATORVASTATIN CALCIUM 10 MG PO TABS: 10.0000 mg | ORAL_TABLET | Freq: Every day | ORAL | 3 refills | Status: DC

## 2018-06-23 NOTE — Assessment & Plan Note (Signed)
Paced No palpitations On eliquis

## 2018-06-23 NOTE — Assessment & Plan Note (Signed)
BP Readings from Last 3 Encounters:  06/23/18 114/84  06/08/18 138/76  08/23/17 140/76   Good control

## 2018-06-23 NOTE — Assessment & Plan Note (Signed)
See social history 

## 2018-06-23 NOTE — Progress Notes (Addendum)
Subjective:    Patient ID: Jason Lambert, male    DOB: [birth year], 84 y.o.   MRN: [id number]  HPI Here for Medicare wellness visit and follow up of chronic health conditions Reviewed form and advanced directives Reviewed other doctors Has an occasional glass of wine--rare now No tobacco Exercises regularly Still travels regularly Vision is okay.  Has high frequency hearing loss on testing--some trouble in big group No falls No depression or anhedonia Independent with instrumental ADLs  Has pacemaker---keeps up with checks No palpitations No chest pain No SOB No dizziness or syncope No edema  Still on statin No myalgia or GI problems  Takes the loratadine daily--occasionally 2 This controls allergy symptoms  Recent blood work reviewed Stable CKD 3 and mild anemia  Current Outpatient Medications on File Prior to Visit  Medication Sig Dispense Refill  . apixaban (ELIQUIS) 2.5 MG TABS tablet Take 1 tablet (2.5 mg total) by mouth 2 (two) times daily. 180 tablet 3  . atorvastatin (LIPITOR) 10 MG tablet Take 1 tablet by mouth every day 90 tablet 0  . loratadine (CLARITIN) 10 MG tablet Take 10 mg by mouth daily.     . metoprolol succinate (TOPROL-XL) 50 MG 24 hr tablet Take 1 tablet by mouth every day with a meal or immediately following a meal. 90 tablet 0  . Multiple Vitamins-Minerals (CENTRUM SILVER PO) Take 1 tablet by mouth daily.       No current facility-administered medications on file prior to visit.     No Known Allergies  Past Medical History:  Diagnosis Date  . 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 Allergic rhinitis   . Atrial fibrillation ([place])   . Grave's disease   . Hyperlipidemia   . Hypertension   . Renal insufficiency   . sinus node dysfunction    s/p BTK pacemaker    Past Surgical History:  Procedure Laterality Date  . CATARACT EXTRACTION W/ INTRAOCULAR LENS  IMPLANT, BILATERAL  2012  . PACEMAKER GENERATOR CHANGE N/A 05/02/2013   Procedure: PACEMAKER GENERATOR CHANGE;  Surgeon: Deboraha Sprang, MD;  [facility] C[facility] LAB;  Service: Cardiovascular;  Laterality: N/A;  . PACEMAKER INSERTION  8/06; 05/02/2013   Bradycardia/ syncope; BTK with generator change 05/02/2013 by Dr Caryl Comes  . Prostate Cancer  2007   RT  . TONSILLECTOMY  1947   adenoids    Family History  Problem Relation Age of Onset  . Cancer Mother   . Diabetes Mother   . Coronary artery disease Father        cabg  . Hypertension Neg Hx   . Prostate cancer Neg Hx   . Colon cancer Neg Hx     Social History   Socioeconomic History  . Marital status: Married    Spouse name: Not on file  . Number of children: 2  . Years of education: Not on file  . Highest education level: Not on file  Occupational History  . Occupation: Retired    Comment: Art gallery manager with [employer]  . Financial resource strain: Not on file  . Food insecurity:    Worry: Not on file    Inability: Not on file  . Transportation needs:    Medical: Not on file    Non-medical: Not on file  Tobacco Use  . Smoking status: Never Smoker  . Smokeless tobacco: Never Used  Substance and Sexual Activity  . Alcohol use: No    Comment: Occasional  .  Drug use: No  . Sexual activity: Not on file  Lifestyle  . Physical activity:    Days per week: Not on file    Minutes per session: Not on file  . Stress: Not on file  Relationships  . Social connections:    Talks on phone: Not on file    Gets together: Not on file    Attends religious service: Not on file    Active member of club or organization: Not on file    Attends meetings of clubs or organizations: Not on file    Relationship status: Not on file  . Intimate partner violence:    Fear of current or ex partner: Not on file    Emotionally abused: Not on file    Physically abused: Not on file    Forced sexual activity: Not on file  Other Topics Concern  . Not on file  Social History Narrative   Has a living  will.    Wife to make health care decisions (then daughter [name])--formal health care POA   Would accept resuscitation   Not sure about feeding tube but no "unusual attempts"         Review of Systems Appetite is good Weight stable Sleeps well mostly Nocturia x 2-3. Stream is okay--mild urgency in day.  Wears seat belt Teeth are okay---keeps up with dentist Doesn't see dermatologist. Does have chronic scab on top of left ear Bowels are fine. No blood. Occasionally loose-- nothing consistent No heartburn or dysphagia No sig back or joint pain    Objective:   Physical Exam  Constitutional: He is oriented to person, place, and time. He appears well-developed. No distress.  HENT:  Mouth/Throat: Oropharynx is clear and moist. No oropharyngeal exudate.  Neck: No thyromegaly present.  Cardiovascular: Normal rate, regular rhythm, normal heart sounds and intact distal pulses. Exam reveals no gallop.  No murmur heard. Rare extra beats  Respiratory: Breath sounds normal. No respiratory distress. He has no wheezes. He has no rales.  GI: Soft. There is no abdominal tenderness.  Musculoskeletal:        General: No edema.  Lymphadenopathy:    He has no cervical adenopathy.  Neurological: He is alert and oriented to person, place, and time.  President--- "Dwaine Deter, Bush" [phone] [id number] Recall 3/3  Skin: No rash noted. No erythema.  Scaly area on top of left pinna--no inflammation (doesn't look like actinic but he will watch)  Psychiatric: He has a normal mood and affect. His behavior is normal.           Assessment & Plan:

## 2018-06-23 NOTE — Assessment & Plan Note (Signed)
Nocturia is not bothersome enough for Rx

## 2018-06-23 NOTE — Assessment & Plan Note (Signed)
I have personally reviewed the Medicare Annual Wellness questionnaire and have noted 1. The patient's medical and social history 2. Their use of alcohol, tobacco or illicit drugs 3. Their current medications and supplements 4. The patient's functional ability including ADL's, fall risks, home safety risks and hearing or visual             impairment. 5. Diet and physical activities 6. Evidence for depression or mood disorders  The patients weight, height, BMI and visual acuity have been recorded in the chart I have made referrals, counseling and provided education to the patient based review of the above and I have provided the pt with a written personalized care plan for preventive services.  I have provided you with a copy of your personalized plan for preventive services. Please take the time to review along with your updated medication list.  Yearly flu vaccine Will try to get shingrix at [facility] Works out regularly No cancer screening due to age

## 2018-06-23 NOTE — Assessment & Plan Note (Signed)
Stable creatinine Off ARB due to hyperkalemia

## 2018-06-23 NOTE — Progress Notes (Signed)
Hearing Screening   Method: Audiometry   125Hz  250Hz  500Hz  1000Hz  2000Hz  3000Hz  4000Hz  6000Hz  8000Hz   Right ear:   40 25 20  0    Left ear:   20 20 20   0    Vision Screening Comments: August 2019

## 2018-07-01 LAB — CUP PACEART INCLINIC DEVICE CHECK
Date Time Interrogation Session: 20200227171601
Implantable Lead Implant Date: 20060801
Implantable Lead Location: 753859
Implantable Lead Location: 753860
Implantable Lead Model: 350
Implantable Lead Serial Number: 24332742
Implantable Pulse Generator Implant Date: 20141229
MDC IDC LEAD IMPLANT DT: 20060801
MDC IDC LEAD SERIAL: 24319010
MDC IDC PG SERIAL: 68145068

## 2018-07-06 ENCOUNTER — Ambulatory Visit (INDEPENDENT_AMBULATORY_CARE_PROVIDER_SITE_OTHER): Payer: PPO | Admitting: *Deleted

## 2018-07-06 DIAGNOSIS — I495 Sick sinus syndrome: Secondary | ICD-10-CM

## 2018-07-06 DIAGNOSIS — I480 Paroxysmal atrial fibrillation: Secondary | ICD-10-CM

## 2018-07-07 LAB — CUP PACEART REMOTE DEVICE CHECK
Implantable Lead Implant Date: 20060801
Implantable Lead Implant Date: 20060801
Implantable Lead Location: 753860
Implantable Lead Model: 350
Implantable Lead Serial Number: 24332742
Implantable Pulse Generator Implant Date: 20141229
MDC IDC LEAD LOCATION: 753859
MDC IDC LEAD SERIAL: 24319010
MDC IDC SESS DTM: 20200304050744
Pulse Gen Serial Number: 68145068

## 2018-07-14 NOTE — Progress Notes (Signed)
Remote pacemaker transmission.   

## 2018-10-05 ENCOUNTER — Ambulatory Visit (INDEPENDENT_AMBULATORY_CARE_PROVIDER_SITE_OTHER): Payer: PPO | Admitting: *Deleted

## 2018-10-05 ENCOUNTER — Ambulatory Visit (INDEPENDENT_AMBULATORY_CARE_PROVIDER_SITE_OTHER): Payer: PPO | Admitting: Internal Medicine

## 2018-10-05 ENCOUNTER — Encounter: Payer: Self-pay | Admitting: Internal Medicine

## 2018-10-05 VITALS — BP 150/93 | Temp 97.4°F | Wt 160.0 lb

## 2018-10-05 DIAGNOSIS — R519 Headache, unspecified: Secondary | ICD-10-CM | POA: Insufficient documentation

## 2018-10-05 DIAGNOSIS — R51 Headache: Secondary | ICD-10-CM | POA: Diagnosis not present

## 2018-10-05 DIAGNOSIS — I495 Sick sinus syndrome: Secondary | ICD-10-CM

## 2018-10-05 DIAGNOSIS — G8929 Other chronic pain: Secondary | ICD-10-CM

## 2018-10-05 NOTE — Progress Notes (Signed)
Subjective:    Patient ID: Jason Lambert, male    DOB: 27-Apr-1935, 84 y.o.   MRN: [id number]  HPI Virtual visit due to headache Identification done Reviewed billing and he gave consent He is at home and I am in my office  He has had intermittent headaches (like once a month) --for years Recently it has started daily --last week Will occur at different times in the day----before getting out of bed, after dinner, etc No particular time and not related to any particular activity (can happen when active or just sitting) Comes on after some vision distortion---then "terrible pain all over my head". (15 minute delay) Throbbing or achy--not sharp May improve with change in head position  No trauma No new exposures No focal weakness Tylenol does help some--especially if he takes it early with the aura Rare nausea Not much change with movement   BP has been up some Today 146/92 Pulse 79-112  Current Outpatient Medications on File Prior to Visit  Medication Sig Dispense Refill  . apixaban (ELIQUIS) 2.5 MG TABS tablet Take 1 tablet (2.5 mg total) by mouth 2 (two) times daily. 180 tablet 3  . atorvastatin (LIPITOR) 10 MG tablet Take 1 tablet (10 mg total) by mouth daily. 90 tablet 3  . loratadine (CLARITIN) 10 MG tablet Take 10 mg by mouth daily.     . metoprolol succinate (TOPROL-XL) 50 MG 24 hr tablet Take 1 tablet by mouth every day with a meal or immediately following a meal. 90 tablet 3  . Multiple Vitamins-Minerals (CENTRUM SILVER PO) Take 1 tablet by mouth daily.       No current facility-administered medications on file prior to visit.     No Known Allergies  Past Medical History:  Diagnosis Date  . 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 Allergic rhinitis   . Atrial fibrillation ([place])   . Grave's disease   . Hyperlipidemia   . Hypertension   . Renal insufficiency   . sinus node dysfunction    s/p BTK pacemaker    Past Surgical History:  Procedure Laterality Date   . CATARACT EXTRACTION W/ INTRAOCULAR LENS  IMPLANT, BILATERAL  2012  . PACEMAKER GENERATOR CHANGE N/A 05/02/2013   Procedure: PACEMAKER GENERATOR CHANGE;  Surgeon: Deboraha Sprang, MD;  Location: [facility] CATH LAB;  Service: Cardiovascular;  Laterality: N/A;  . PACEMAKER INSERTION  8/06; 05/02/2013   Bradycardia/ syncope; BTK with generator change 05/02/2013 by Dr Caryl Comes  . Prostate Cancer  2007   RT  . TONSILLECTOMY  1947   adenoids    Family History  Problem Relation Age of Onset  . Cancer Mother   . Diabetes Mother   . Coronary artery disease Father        cabg  . Hypertension Neg Hx   . Prostate cancer Neg Hx   . Colon cancer Neg Hx     Social History   Socioeconomic History  . Marital status: Married    Spouse name: Not on file  . Number of children: 2  . Years of education: Not on file  . Highest education level: Not on file  Occupational History  . Occupation: Retired    Comment: Art gallery manager with [employer]  . Financial resource strain: Not on file  . Food insecurity:    Worry: Not on file    Inability: Not on file  . Transportation needs:    Medical: Not on file    Non-medical:  Not on file  Tobacco Use  . Smoking status: Never Smoker  . Smokeless tobacco: Never Used  Substance and Sexual Activity  . Alcohol use: No    Comment: Occasional  . Drug use: No  . Sexual activity: Not on file  Lifestyle  . Physical activity:    Days per week: Not on file    Minutes per session: Not on file  . Stress: Not on file  Relationships  . Social connections:    Talks on phone: Not on file    Gets together: Not on file    Attends religious service: Not on file    Active member of club or organization: Not on file    Attends meetings of clubs or organizations: Not on file    Relationship status: Not on file  . Intimate partner violence:    Fear of current or ex partner: Not on file    Emotionally abused: Not on file    Physically abused: Not on file     Forced sexual activity: Not on file  Other Topics Concern  . Not on file  Social History Narrative   Has a living will.    Wife to make health care decisions (then daughter [name])--formal health care POA   Would accept resuscitation   Not sure about feeding tube but no "unusual attempts"         Review of Systems Eating okay Sleeps well No neck pain or stiffness Some allergy symptoms--worse this spring. Seems to be easing some     Objective:   Physical Exam  Constitutional: He appears well-developed. No distress.  Musculoskeletal:        General: No edema.  Neurological: No cranial nerve deficit.  CN normal --but couldn't check pupillary response           Assessment & Plan:

## 2018-10-05 NOTE — Assessment & Plan Note (Signed)
Has facets of migraine but no problems with motion or nausea Visual aura which has been present with his approximately monthly migraines Could be related to increased BP but not sure if that is a secondary process  For now, will increase metoprolol to 100mg  a day (could help if BP or migraine related) Continue tylenol prn He will update me by email within a week If ongoing symptoms, will consider neurology evaluation

## 2018-10-07 LAB — CUP PACEART REMOTE DEVICE CHECK
Battery Remaining Percentage: 65 %
Brady Statistic RA Percent Paced: 98 %
Brady Statistic RV Percent Paced: 0 %
Date Time Interrogation Session: 20200604113326
Implantable Lead Implant Date: 20060801
Implantable Lead Implant Date: 20060801
Implantable Lead Location: 753859
Implantable Lead Location: 753860
Implantable Lead Model: 350
Implantable Lead Serial Number: 24319010
Implantable Lead Serial Number: 24332742
Implantable Pulse Generator Implant Date: 20141229
Lead Channel Impedance Value: 410 Ohm
Lead Channel Impedance Value: 644 Ohm
Lead Channel Pacing Threshold Amplitude: 0.3 V
Lead Channel Pacing Threshold Amplitude: 0.8 V
Lead Channel Pacing Threshold Pulse Width: 0.4 ms
Lead Channel Pacing Threshold Pulse Width: 0.4 ms
Lead Channel Sensing Intrinsic Amplitude: 10.6 mV
Lead Channel Sensing Intrinsic Amplitude: 2.6 mV
Lead Channel Setting Pacing Pulse Width: 0.4 ms
Pulse Gen Serial Number: 68145068

## 2018-10-12 MED ORDER — HYDROCHLOROTHIAZIDE 25 MG PO TABS: 25.0000 mg | ORAL_TABLET | Freq: Every day | ORAL | 3 refills | Status: DC

## 2018-10-12 NOTE — Telephone Encounter (Signed)
Please set him up for an in office visit in about a month

## 2018-10-13 ENCOUNTER — Encounter: Payer: Self-pay | Admitting: Cardiology

## 2018-10-13 NOTE — Progress Notes (Signed)
Remote pacemaker transmission.   

## 2018-11-09 ENCOUNTER — Encounter: Payer: Self-pay | Admitting: Internal Medicine

## 2018-11-09 ENCOUNTER — Ambulatory Visit (INDEPENDENT_AMBULATORY_CARE_PROVIDER_SITE_OTHER): Payer: PPO | Admitting: Internal Medicine

## 2018-11-09 ENCOUNTER — Other Ambulatory Visit: Payer: Self-pay

## 2018-11-09 DIAGNOSIS — I10 Essential (primary) hypertension: Secondary | ICD-10-CM

## 2018-11-09 LAB — RENAL FUNCTION PANEL
Albumin: 4.4 g/dL (ref 3.5–5.2)
BUN: 26 mg/dL — ABNORMAL HIGH (ref 6–23)
CO2: 30 mEq/L (ref 19–32)
Calcium: 9.2 mg/dL (ref 8.4–10.5)
Chloride: 94 mEq/L — ABNORMAL LOW (ref 96–112)
Creatinine, Ser: 1.77 mg/dL — ABNORMAL HIGH (ref 0.40–1.50)
GFR: 37.02 mL/min — ABNORMAL LOW (ref >60.00)
Glucose, Bld: 113 mg/dL — ABNORMAL HIGH (ref 70–99)
Phosphorus: 3.4 mg/dL (ref 2.3–4.6)
Potassium: 4 mEq/L (ref 3.5–5.1)
Sodium: 133 mEq/L — ABNORMAL LOW (ref 135–145)

## 2018-11-09 MED ORDER — METOPROLOL SUCCINATE ER 100 MG PO TB24: 100.0000 mg | ORAL_TABLET | Freq: Every day | ORAL | 3 refills | Status: DC

## 2018-11-09 MED ORDER — HYDROCHLOROTHIAZIDE 25 MG PO TABS: 25.0000 mg | ORAL_TABLET | Freq: Every day | ORAL | 3 refills | Status: DC

## 2018-11-09 NOTE — Assessment & Plan Note (Signed)
BP Readings from Last 3 Encounters:  11/09/18 132/78  10/05/18 (!) 150/93  06/23/18 114/84   Seems to be better with the higher metoprolol and HCTZ Headaches gone Some variability at home but fine here Asked him to check pulse by hand if elevated on his machine

## 2018-11-09 NOTE — Progress Notes (Signed)
Subjective:    Patient ID: Jason Lambert, male    DOB: Nov 11, 1934, 84 y.o.   MRN: [id number]  HPI Here for follow up of HTN  No problems with the new medication Taking 100 of the metoprolol as well Reviewed his BP measurements--does every day Almost all recently under 150/90--these are immediately after sitting up out of bed Pulse still occasionally over 100--at rest  No headaches in the past month almost No dizziness No chest pain--but occasionally feels pressure in left neck and groin No edema  Current Outpatient Medications on File Prior to Visit  Medication Sig Dispense Refill  . apixaban (ELIQUIS) 2.5 MG TABS tablet Take 1 tablet (2.5 mg total) by mouth 2 (two) times daily. 180 tablet 3  . atorvastatin (LIPITOR) 10 MG tablet Take 1 tablet (10 mg total) by mouth daily. 90 tablet 3  . hydrochlorothiazide (HYDRODIURIL) 25 MG tablet Take 1 tablet (25 mg total) by mouth daily. 30 tablet 3  . loratadine (CLARITIN) 10 MG tablet Take 10 mg by mouth daily.     . metoprolol succinate (TOPROL-XL) 50 MG 24 hr tablet Take 1 tablet by mouth every day with a meal or immediately following a meal. 90 tablet 3  . Multiple Vitamins-Minerals (CENTRUM SILVER PO) Take 1 tablet by mouth daily.       No current facility-administered medications on file prior to visit.     No Known Allergies  Past Medical History:  Diagnosis Date  . Adenocarcinoma ([place])    Prostate  . Allergic rhinitis   . Atrial fibrillation ([place])   . Grave's disease   . Hyperlipidemia   . Hypertension   . Renal insufficiency   . sinus node dysfunction    s/p BTK pacemaker    Past Surgical History:  Procedure Laterality Date  . CATARACT EXTRACTION W/ INTRAOCULAR LENS  IMPLANT, BILATERAL  2012  . PACEMAKER GENERATOR CHANGE N/A 05/02/2013   Procedure: PACEMAKER GENERATOR CHANGE;  Surgeon: Deboraha Sprang, MD;  Location: [facility] CATH LAB;  Service: Cardiovascular;  Laterality: N/A;  . PACEMAKER INSERTION  8/06; 05/02/2013    Bradycardia/ syncope; BTK with generator change 05/02/2013 by Dr Caryl Comes  . Prostate Cancer  2007   RT  . TONSILLECTOMY  1947   adenoids    Family History  Problem Relation Age of Onset  . Cancer Mother   . Diabetes Mother   . Coronary artery disease Father        cabg  . Hypertension Neg Hx   . Prostate cancer Neg Hx   . Colon cancer Neg Hx     Social History   Socioeconomic History  . Marital status: Married    Spouse name: Not on file  . Number of children: 2  . Years of education: Not on file  . Highest education level: Not on file  Occupational History  . 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 Financial resource strain: Not on file  . Food insecurity    Worry: Not on file    Inability: Not on file  . Transportation needs    Medical: Not on file    Non-medical: Not on file  Tobacco Use  . Smoking status: Never Smoker  . Smokeless tobacco: Never Used  Substance and Sexual Activity  . Alcohol use: No    Comment: Occasional  . Drug use: No  . Sexual activity: Not on file  Lifestyle  . Physical activity  Days per week: Not on file    Minutes per session: Not on file  . Stress: Not on file  Relationships  . Social Herbalist on phone: Not on file    Gets together: Not on file    Attends religious service: Not on file    Active member of club or organization: Not on file    Attends meetings of clubs or organizations: Not on file    Relationship status: Not on file  . Intimate partner violence    Fear of current or ex partner: Not on file    Emotionally abused: Not on file    Physically abused: Not on file    Forced sexual activity: Not on file  Other Topics Concern  . Not on file  Social History Narrative   Has a living will.    Wife to make health care decisions (then daughter [name])--formal health care POA   Would accept resuscitation   Not sure about feeding tube but no "unusual attempts"          Review of Systems Sleeps well--nice dreams lately (new for him) Nocturia x 3 is stable Appetite is fine Weight is stable Not as much exercise-- but still uses nu-step nightly for 30 minutes    Objective:   Physical Exam  Constitutional: He appears well-developed. No distress.  Neck: No thyromegaly present.  Cardiovascular: Normal rate, regular rhythm and normal heart sounds. Exam reveals no gallop.  No murmur heard. Respiratory: Effort normal and breath sounds normal. No respiratory distress. He has no wheezes. He has no rales.  Musculoskeletal:        General: No edema.  Lymphadenopathy:    He has no cervical adenopathy.  Psychiatric: He has a normal mood and affect. His behavior is normal.           Assessment & Plan:

## 2018-12-10 ENCOUNTER — Encounter: Payer: Self-pay | Admitting: Internal Medicine

## 2018-12-10 ENCOUNTER — Ambulatory Visit (INDEPENDENT_AMBULATORY_CARE_PROVIDER_SITE_OTHER): Payer: PPO | Admitting: Internal Medicine

## 2018-12-10 ENCOUNTER — Other Ambulatory Visit: Payer: Self-pay

## 2018-12-10 DIAGNOSIS — L570 Actinic keratosis: Secondary | ICD-10-CM

## 2018-12-10 NOTE — Progress Notes (Signed)
Subjective:    Patient ID: Jason Lambert, male    DOB: July 19, 1934, 84 y.o.   MRN: [id number]  HPI Here for treatment of lesion on his head  Has noted this spot for a long time--months Not going away Scabbed and weeps at times No pain  Current Outpatient Medications on File Prior to Visit  Medication Sig Dispense Refill  . apixaban (ELIQUIS) 2.5 MG TABS tablet Take 1 tablet (2.5 mg total) by mouth 2 (two) times daily. 180 tablet 3  . atorvastatin (LIPITOR) 10 MG tablet Take 1 tablet (10 mg total) by mouth daily. 90 tablet 3  . hydrochlorothiazide (HYDRODIURIL) 25 MG tablet Take 1 tablet (25 mg total) by mouth daily. 90 tablet 3  . loratadine (CLARITIN) 10 MG tablet Take 10 mg by mouth daily.     . metoprolol succinate (TOPROL-XL) 100 MG 24 hr tablet Take 1 tablet (100 mg total) by mouth daily. Take with or immediately following a meal. 90 tablet 3  . Multiple Vitamins-Minerals (CENTRUM SILVER PO) Take 1 tablet by mouth daily.       No current facility-administered medications on file prior to visit.     No Known Allergies  Past Medical History:  Diagnosis Date  . Adenocarcinoma ([place])    Prostate  . Allergic rhinitis   . Atrial fibrillation ([place])   . Grave's disease   . Hyperlipidemia   . Hypertension   . Renal insufficiency   . sinus node dysfunction    s/p BTK pacemaker    Past Surgical History:  Procedure Laterality Date  . CATARACT EXTRACTION W/ INTRAOCULAR LENS  IMPLANT, BILATERAL  2012  . PACEMAKER GENERATOR CHANGE N/A 05/02/2013   Procedure: PACEMAKER GENERATOR CHANGE;  Surgeon: Deboraha Sprang, MD;  Location: [facility] CATH LAB;  Service: Cardiovascular;  Laterality: N/A;  . PACEMAKER INSERTION  8/06; 05/02/2013   Bradycardia/ syncope; BTK with generator change 05/02/2013 by Dr Caryl Comes  . Prostate Cancer  2007   RT  . TONSILLECTOMY  1947   adenoids    Family History  Problem Relation Age of Onset  . Cancer Mother   . Diabetes Mother   . Coronary artery  disease Father        cabg  . Hypertension Neg Hx   . Prostate cancer Neg Hx   . Colon cancer Neg Hx     Social History   Socioeconomic History  . Marital status: Married    Spouse name: Not on file  . Number of children: 2  . Years of education: Not on file  . Highest education level: Not on file  Occupational History  . 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  . Financial resource strain: Not on file  . Food insecurity    Worry: Not on file    Inability: Not on file  . Transportation needs    Medical: Not on file    Non-medical: Not on file  Tobacco Use  . Smoking status: Never Smoker  . Smokeless tobacco: Never Used  Substance and Sexual Activity  . Alcohol use: No    Comment: Occasional  . Drug use: No  . Sexual activity: Not on file  Lifestyle  . Physical activity    Days per week: Not on file    Minutes per session: Not on file  . Stress: Not on file  Relationships  . Social connections    Talks on phone: Not on file  Gets together: Not on file    Attends religious service: Not on file    Active member of club or organization: Not on file    Attends meetings of clubs or organizations: Not on file    Relationship status: Not on file  . Intimate partner violence    Fear of current or ex partner: Not on file    Emotionally abused: Not on file    Physically abused: Not on file    Forced sexual activity: Not on file  Other Topics Concern  . Not on file  Social History Narrative   Has a living will.    Wife to make health care decisions (then daughter [name])--formal health care POA   Would accept resuscitation   Not sure about feeding tube but no "unusual attempts"         Review of Systems     Objective:   Physical Exam  Constitutional: He appears well-developed. No distress.  Skin:  ~54mm crusted, slightly inflamed area on left parietal scalp           Assessment & Plan:

## 2018-12-10 NOTE — Assessment & Plan Note (Signed)
Suspicious lesion Discussed alternatives---verbal consent Liquid nitrogen 30 seconds x 2 Tolerated well Discussed home care If recurs, to derm for evaluation and probably biopsy

## 2019-01-04 ENCOUNTER — Ambulatory Visit (INDEPENDENT_AMBULATORY_CARE_PROVIDER_SITE_OTHER): Payer: PPO

## 2019-01-04 ENCOUNTER — Ambulatory Visit (INDEPENDENT_AMBULATORY_CARE_PROVIDER_SITE_OTHER): Payer: PPO | Admitting: *Deleted

## 2019-01-04 DIAGNOSIS — I480 Paroxysmal atrial fibrillation: Secondary | ICD-10-CM

## 2019-01-04 DIAGNOSIS — I495 Sick sinus syndrome: Secondary | ICD-10-CM

## 2019-01-04 DIAGNOSIS — Z23 Encounter for immunization: Secondary | ICD-10-CM | POA: Diagnosis not present

## 2019-01-04 LAB — CUP PACEART REMOTE DEVICE CHECK
Date Time Interrogation Session: 20200901172159
Implantable Lead Implant Date: 20060801
Implantable Lead Implant Date: 20060801
Implantable Lead Location: 753859
Implantable Lead Location: 753860
Implantable Lead Model: 350
Implantable Lead Serial Number: 24319010
Implantable Lead Serial Number: 24332742
Implantable Pulse Generator Implant Date: 20141229
Pulse Gen Serial Number: 68145068

## 2019-01-18 NOTE — Progress Notes (Signed)
Remote pacemaker transmission.   

## 2019-04-05 ENCOUNTER — Ambulatory Visit (INDEPENDENT_AMBULATORY_CARE_PROVIDER_SITE_OTHER): Payer: PPO | Admitting: *Deleted

## 2019-04-05 DIAGNOSIS — Z950 Presence of cardiac pacemaker: Secondary | ICD-10-CM | POA: Diagnosis not present

## 2019-04-05 LAB — CUP PACEART REMOTE DEVICE CHECK
Date Time Interrogation Session: 20201201120857
Implantable Lead Implant Date: 20060801
Implantable Lead Implant Date: 20060801
Implantable Lead Location: 753859
Implantable Lead Location: 753860
Implantable Lead Model: 350
Implantable Lead Serial Number: 24319010
Implantable Lead Serial Number: 24332742
Implantable Pulse Generator Implant Date: 20141229
Pulse Gen Serial Number: 68145068

## 2019-04-30 NOTE — Progress Notes (Signed)
PPM remote 

## 2019-05-31 DIAGNOSIS — H26491 Other secondary cataract, right eye: Secondary | ICD-10-CM | POA: Diagnosis not present

## 2019-06-07 ENCOUNTER — Ambulatory Visit (INDEPENDENT_AMBULATORY_CARE_PROVIDER_SITE_OTHER): Payer: PPO | Admitting: Internal Medicine

## 2019-06-07 ENCOUNTER — Encounter: Payer: Self-pay | Admitting: Internal Medicine

## 2019-06-07 ENCOUNTER — Other Ambulatory Visit: Payer: Self-pay

## 2019-06-07 VITALS — BP 140/80 | HR 81 | Ht 65.0 in | Wt 169.5 lb

## 2019-06-07 DIAGNOSIS — I480 Paroxysmal atrial fibrillation: Secondary | ICD-10-CM | POA: Diagnosis not present

## 2019-06-07 DIAGNOSIS — Z950 Presence of cardiac pacemaker: Secondary | ICD-10-CM

## 2019-06-07 DIAGNOSIS — I495 Sick sinus syndrome: Secondary | ICD-10-CM | POA: Diagnosis not present

## 2019-06-07 NOTE — Progress Notes (Signed)
Patient Care Team: Venia Carbon, MD as PCP - General   HPI  Jason Lambert is a 85 y.o. male Seen in followup for pacemaker implanted for sinus node dysfunction and syncope. He has a Biotronik device. He underwent  generator replacement 12/14.  The patient was found to have atrial fibrillation on his device recordings.  Anticoagulation was initiated w apixoban   The patient denies chest pain, shortness of breath, nocturnal dyspnea, orthopnea Does have some peripheral edema; diet salt averse There have been no palpitations, lightheadedness or syncope.    No medication issues.  Got Covid vaccine   Date Cr K Hgb  12/16 1.46 4.6 12.9  1/18 1.63 4.9 12.3  7/20 1.77 4.0 12.6(2/20)      Past Medical History:  Diagnosis Date  . Adenocarcinoma ([place])    Prostate  . Allergic rhinitis   . Atrial fibrillation ([place])   . Grave's disease   . Hyperlipidemia   . Hypertension   . Renal insufficiency   . sinus node dysfunction    s/p BTK pacemaker    Past Surgical History:  Procedure Laterality Date  . CATARACT EXTRACTION W/ INTRAOCULAR LENS  IMPLANT, BILATERAL  2012  . PACEMAKER GENERATOR CHANGE N/A 05/02/2013   Procedure: PACEMAKER GENERATOR CHANGE;  Surgeon: Deboraha Sprang, MD;  Location: [facility] CATH LAB;  Service: Cardiovascular;  Laterality: N/A;  . PACEMAKER INSERTION  8/06; 05/02/2013   Bradycardia/ syncope; BTK with generator change 05/02/2013 by Dr Caryl Comes  . Prostate Cancer  2007   RT  . TONSILLECTOMY  1947   adenoids    Current Outpatient Medications  Medication Sig Dispense Refill  . apixaban (ELIQUIS) 2.5 MG TABS tablet Take 1 tablet (2.5 mg total) by mouth 2 (two) times daily. 180 tablet 3  . atorvastatin (LIPITOR) 10 MG tablet Take 1 tablet (10 mg total) by mouth daily. 90 tablet 3  . hydrochlorothiazide (HYDRODIURIL) 25 MG tablet Take 1 tablet (25 mg total) by mouth daily. 90 tablet 3  . loratadine (CLARITIN) 10 MG tablet Take 10 mg by mouth daily.      . metoprolol succinate (TOPROL-XL) 100 MG 24 hr tablet Take 1 tablet (100 mg total) by mouth daily. Take with or immediately following a meal. 90 tablet 3  . Multiple Vitamins-Minerals (CENTRUM SILVER PO) Take 1 tablet by mouth daily.       No current facility-administered medications for this visit.    No Known Allergies  Review of Systems negative except from HPI and PMH  Physical Exam BP 140/80 (BP Location: Left Arm, Patient Position: Sitting, Cuff Size: Normal)   Pulse 81   Ht 5\' 5"  (1.651 m)   Wt 169 lb 8 oz (76.9 kg)   BMI 28.21 kg/m  Well developed and well nourished in no acute distress HENT normal Neck supple with JVP-flat Clear Device pocket well healed; without hematoma or erythema.  There is no tethering  Regular rate and rhythm, no murmur Abd-soft with active BS No Clubbing cyanosis 1+ edema Skin-warm and dry A & Oriented  Grossly normal sensory and motor function  ECG    Assessment and  Plan  Sinus node dysfunction   Pacemaker Biotoronik. The patient's device was interrogated.  The information was reviewed. No changes were made in the programming.    \ Atrial fibrillation-paroxysmal  Neurocardiogenic syncope  Hypertension  Volume overload  Renal insufficiency  Gd 3 (GFR 37)    Renal function informs Rivaroxaban dosing On  Anticoagulation;  No bleeding issues   Some volume overload without symptoms-- will increase hctz 25>>50 for 3d/ next week only  Device affords reasonable heart rate excursion  No interval syncope

## 2019-06-07 NOTE — Patient Instructions (Signed)
Medication Instructions:  - Your physician has recommended you make the following change in your medication:   1) Increase hydrochlorothiazide (HCTZ) 25 mg- take 2 tablets (50 mg) three times in the next week only, then resume 1 tablet (25 mg) once daily   *If you need a refill on your cardiac medications before your next appointment, please call your pharmacy*  Lab Work: - none ordered  If you have labs (blood work) drawn today and your tests are completely normal, you will receive your results only by: Marland Kitchen MyChart Message (if you have MyChart) OR . A paper copy in the mail If you have any lab test that is abnormal or we need to change your treatment, we will call you to review the results.  Testing/Procedures: - none ordered  Follow-Up: At [facility], you and your health needs are our priority.  As part of our continuing mission to provide you with exceptional heart care, we have created designated Provider Care Teams.  These Care Teams include your primary Cardiologist (physician) and Advanced Practice Providers (APPs -  Physician Assistants and Nurse Practitioners) who all work together to provide you with the care you need, when you need it.  Your next appointment:   1 year(s)  The format for your next appointment:   In Person  Provider:   Virl Axe, MD  Other Instructions N/a

## 2019-06-08 ENCOUNTER — Other Ambulatory Visit: Payer: Self-pay

## 2019-06-08 MED ORDER — ATORVASTATIN CALCIUM 10 MG PO TABS: 10.0000 mg | ORAL_TABLET | Freq: Every day | ORAL | 0 refills | Status: DC

## 2019-06-14 ENCOUNTER — Other Ambulatory Visit: Payer: Self-pay | Admitting: *Deleted

## 2019-06-14 MED ORDER — APIXABAN 2.5 MG PO TABS: 2.5000 mg | ORAL_TABLET | Freq: Two times a day (BID) | ORAL | 3 refills | Status: DC

## 2019-06-29 ENCOUNTER — Encounter: Payer: PPO | Admitting: Internal Medicine

## 2019-07-05 ENCOUNTER — Ambulatory Visit (INDEPENDENT_AMBULATORY_CARE_PROVIDER_SITE_OTHER): Payer: PPO | Admitting: *Deleted

## 2019-07-05 DIAGNOSIS — Z950 Presence of cardiac pacemaker: Secondary | ICD-10-CM | POA: Diagnosis not present

## 2019-07-05 LAB — CUP PACEART REMOTE DEVICE CHECK
Date Time Interrogation Session: 20210302131913
Implantable Lead Implant Date: 20060801
Implantable Lead Implant Date: 20060801
Implantable Lead Location: 753859
Implantable Lead Location: 753860
Implantable Lead Model: 350
Implantable Lead Serial Number: 24319010
Implantable Lead Serial Number: 24332742
Implantable Pulse Generator Implant Date: 20141229
Pulse Gen Serial Number: 68145068

## 2019-07-06 NOTE — Progress Notes (Signed)
PPM Remote  

## 2019-07-26 ENCOUNTER — Ambulatory Visit (INDEPENDENT_AMBULATORY_CARE_PROVIDER_SITE_OTHER): Payer: PPO | Admitting: Internal Medicine

## 2019-07-26 ENCOUNTER — Other Ambulatory Visit: Payer: Self-pay

## 2019-07-26 ENCOUNTER — Encounter: Payer: Self-pay | Admitting: Internal Medicine

## 2019-07-26 VITALS — BP 132/82 | HR 75 | Temp 97.4°F | Ht 65.0 in | Wt 168.0 lb

## 2019-07-26 DIAGNOSIS — L570 Actinic keratosis: Secondary | ICD-10-CM | POA: Diagnosis not present

## 2019-07-26 DIAGNOSIS — N2581 Secondary hyperparathyroidism of renal origin: Secondary | ICD-10-CM

## 2019-07-26 DIAGNOSIS — I4891 Unspecified atrial fibrillation: Secondary | ICD-10-CM | POA: Diagnosis not present

## 2019-07-26 DIAGNOSIS — N1832 Chronic kidney disease, stage 3b: Secondary | ICD-10-CM

## 2019-07-26 LAB — VITAMIN D 25 HYDROXY (VIT D DEFICIENCY, FRACTURES): VITD: 59.04 ng/mL (ref 30.00–100.00)

## 2019-07-26 LAB — CBC
HCT: 35 % — ABNORMAL LOW (ref 39.0–52.0)
Hemoglobin: 12.4 g/dL — ABNORMAL LOW (ref 13.0–17.0)
MCHC: 35.3 g/dL (ref 30.0–36.0)
MCV: 96.6 fl (ref 78.0–100.0)
Platelets: 242 10*3/uL (ref 150.0–400.0)
RBC: 3.63 Mil/uL — ABNORMAL LOW (ref 4.22–5.81)
RDW: 13.1 % (ref 11.5–15.5)
WBC: 5.5 10*3/uL (ref 4.0–10.5)

## 2019-07-26 LAB — HEPATIC FUNCTION PANEL
ALT: 18 U/L (ref 0–53)
AST: 10 U/L (ref 0–37)
Albumin: 4.4 g/dL (ref 3.5–5.2)
Alkaline Phosphatase: 90 U/L (ref 39–117)
Bilirubin, Direct: 0.2 mg/dL (ref 0.0–0.3)
Total Bilirubin: 1 mg/dL (ref 0.2–1.2)
Total Protein: 6.9 g/dL (ref 6.0–8.3)

## 2019-07-26 LAB — LIPID PANEL
Cholesterol: 155 mg/dL (ref 0–200)
HDL: 39.3 mg/dL (ref >39.00)
NonHDL: 115.82
Total CHOL/HDL Ratio: 4
Triglycerides: 242 mg/dL — ABNORMAL HIGH (ref 0.0–149.0)
VLDL: 48.4 mg/dL — ABNORMAL HIGH (ref 0.0–40.0)

## 2019-07-26 LAB — LDL CHOLESTEROL, DIRECT: Direct LDL: 80 mg/dL

## 2019-07-26 LAB — RENAL FUNCTION PANEL
Albumin: 4.4 g/dL (ref 3.5–5.2)
BUN: 22 mg/dL (ref 6–23)
CO2: 30 mEq/L (ref 19–32)
Calcium: 9.4 mg/dL (ref 8.4–10.5)
Chloride: 98 mEq/L (ref 96–112)
Creatinine, Ser: 1.64 mg/dL — ABNORMAL HIGH (ref 0.40–1.50)
GFR: 40.35 mL/min — ABNORMAL LOW (ref >60.00)
Glucose, Bld: 151 mg/dL — ABNORMAL HIGH (ref 70–99)
Phosphorus: 2.9 mg/dL (ref 2.3–4.6)
Potassium: 4.2 mEq/L (ref 3.5–5.1)
Sodium: 136 mEq/L (ref 135–145)

## 2019-07-26 LAB — TSH: TSH: 2.66 u[IU]/mL (ref 0.35–4.50)

## 2019-07-26 LAB — T4, FREE: Free T4: 0.91 ng/dL (ref 0.60–1.60)

## 2019-07-26 MED ORDER — METOPROLOL SUCCINATE ER 100 MG PO TB24: 100.0000 mg | ORAL_TABLET | Freq: Every day | ORAL | 3 refills | Status: DC

## 2019-07-26 MED ORDER — HYDROCHLOROTHIAZIDE 25 MG PO TABS: 25.0000 mg | ORAL_TABLET | Freq: Every day | ORAL | 3 refills | Status: DC

## 2019-07-26 NOTE — Progress Notes (Signed)
Subjective:    Patient ID: Jason Lambert, male    DOB: [birth year], 85 y.o.   MRN: [id number]  HPI Here due to new skin lesions  This visit occurred during the SARS-CoV-2 public health emergency.  Safety protocols were in place, including screening questions prior to the visit, additional usage of staff PPE, and extensive cleaning of exam room while observing appropriate contact time as indicated for disinfecting solutions.   Has bump on left temple Bigger one on the tip of his nose--that is bothering him more Goes back 3 months Some tenderness No Rx  Current Outpatient Medications on File Prior to Visit  Medication Sig Dispense Refill  . apixaban (ELIQUIS) 2.5 MG TABS tablet Take 1 tablet (2.5 mg total) by mouth 2 (two) times daily. 180 tablet 3  . atorvastatin (LIPITOR) 10 MG tablet Take 1 tablet (10 mg total) by mouth daily. 90 tablet 0  . hydrochlorothiazide (HYDRODIURIL) 25 MG tablet Take 1 tablet (25 mg total) by mouth daily. 90 tablet 3  . loratadine (CLARITIN) 10 MG tablet Take 10 mg by mouth daily.     . metoprolol succinate (TOPROL-XL) 100 MG 24 hr tablet Take 1 tablet (100 mg total) by mouth daily. Take with or immediately following a meal. 90 tablet 3  . Multiple Vitamins-Minerals (CENTRUM SILVER PO) Take 1 tablet by mouth daily.       No current facility-administered medications on file prior to visit.    No Known Allergies  Past Medical History:  Diagnosis Date  . Adenocarcinoma ([place])    Prostate  . Allergic rhinitis   . Atrial fibrillation ([place])   . Grave's disease   . Hyperlipidemia   . Hypertension   . Renal insufficiency   . sinus node dysfunction    s/p BTK pacemaker    Past Surgical History:  Procedure Laterality Date  . CATARACT EXTRACTION W/ INTRAOCULAR LENS  IMPLANT, BILATERAL  2012  . PACEMAKER GENERATOR CHANGE N/A 05/02/2013   Procedure: PACEMAKER GENERATOR CHANGE;  Surgeon: Deboraha Sprang, MD;  Location: [facility] CATH LAB;  Service:  Cardiovascular;  Laterality: N/A;  . PACEMAKER INSERTION  8/06; 05/02/2013   Bradycardia/ syncope; BTK with generator change 05/02/2013 by Dr Caryl Comes  . Prostate Cancer  2007   RT  . TONSILLECTOMY  1947   adenoids    Family History  Problem Relation Age of Onset  . Cancer Mother   . Diabetes Mother   . Coronary artery disease Father        cabg  . Hypertension Neg Hx   . Prostate cancer Neg Hx   . Colon cancer Neg Hx     Social History   Socioeconomic History  . Marital status: Married    Spouse name: Not on file  . Number of children: 2  . Years of education: Not on file  . Highest education level: Not on file  Occupational History  . 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 Smoking status: Never Smoker  . Smokeless tobacco: Never Used  Substance and Sexual Activity  . Alcohol use: No    Comment: Occasional  . Drug use: No  . Sexual activity: Not on file  Other Topics Concern  . Not on file  Social History Narrative   Has a living will.    Wife to make health care decisions (then daughter [name])--formal health care POA   Would accept resuscitation   Not sure about feeding  tube but no "unusual attempts"         Social Determinants of Health   Financial Resource Strain:   . Difficulty of Paying Living Expenses:   Food Insecurity:   . Worried About Charity fundraiser in the Last Year:   . Arboriculturist in the Last Year:   Transportation Needs:   . Film/video editor (Medical):   Marland Kitchen Lack of Transportation (Non-Medical):   Physical Activity:   . Days of Exercise per Week:   . Minutes of Exercise per Session:   Stress:   . Feeling of Stress :   Social Connections:   . Frequency of Communication with Friends and Family:   . Frequency of Social Gatherings with Friends and Family:   . Attends Religious Services:   . Active Member of Clubs or Organizations:   . Attends Archivist Meetings:   Marland Kitchen Marital  Status:   Intimate Partner Violence:   . Fear of Current or Ex-Partner:   . Emotionally Abused:   Marland Kitchen Physically Abused:   . Sexually Abused:    Review of Systems  No other concerns Feels well     Objective:   Physical Exam  Skin:  Stable oval red lesion on dorsum of right hand  Cutaneous horn with inflammation on tip of left nose Scaly area on left temple-- ~78mm           Assessment & Plan:

## 2019-07-26 NOTE — Assessment & Plan Note (Signed)
Clear actinic on left temple Scale picked off and inflamed base Discussed alternative---verbal consent Liquid nitrogen 40 seconds x 2---tolerated well Discussed home care  Nasal lesion likely precancerous but not certain Discussed options and he gave verbal consent Liquid nitrogen x 40 seconds x 2 as well Home care discussed

## 2019-07-27 DIAGNOSIS — N2581 Secondary hyperparathyroidism of renal origin: Secondary | ICD-10-CM | POA: Insufficient documentation

## 2019-07-27 LAB — PARATHYROID HORMONE, INTACT (NO CA): PTH: 124 pg/mL — ABNORMAL HIGH (ref 14–64)

## 2019-07-27 NOTE — Assessment & Plan Note (Signed)
Review of labs shows elevated PTH Calcium is normal Will have him add vitamin D

## 2019-07-29 ENCOUNTER — Ambulatory Visit (INDEPENDENT_AMBULATORY_CARE_PROVIDER_SITE_OTHER): Payer: PPO | Admitting: Internal Medicine

## 2019-07-29 ENCOUNTER — Encounter: Payer: Self-pay | Admitting: Internal Medicine

## 2019-07-29 ENCOUNTER — Other Ambulatory Visit: Payer: Self-pay

## 2019-07-29 VITALS — BP 138/74 | HR 65 | Temp 97.5°F | Ht 65.0 in | Wt 166.0 lb

## 2019-07-29 DIAGNOSIS — Z7189 Other specified counseling: Secondary | ICD-10-CM

## 2019-07-29 DIAGNOSIS — R7309 Other abnormal glucose: Secondary | ICD-10-CM | POA: Diagnosis not present

## 2019-07-29 DIAGNOSIS — N138 Other obstructive and reflux uropathy: Secondary | ICD-10-CM | POA: Diagnosis not present

## 2019-07-29 DIAGNOSIS — I4891 Unspecified atrial fibrillation: Secondary | ICD-10-CM | POA: Diagnosis not present

## 2019-07-29 DIAGNOSIS — I10 Essential (primary) hypertension: Secondary | ICD-10-CM | POA: Diagnosis not present

## 2019-07-29 DIAGNOSIS — Z0000 Encounter for general adult medical examination without abnormal findings: Secondary | ICD-10-CM

## 2019-07-29 DIAGNOSIS — N2581 Secondary hyperparathyroidism of renal origin: Secondary | ICD-10-CM

## 2019-07-29 DIAGNOSIS — N1831 Chronic kidney disease, stage 3a: Secondary | ICD-10-CM | POA: Diagnosis not present

## 2019-07-29 DIAGNOSIS — N401 Enlarged prostate with lower urinary tract symptoms: Secondary | ICD-10-CM

## 2019-07-29 LAB — GLUCOSE, RANDOM: Glucose, Bld: 110 mg/dL — ABNORMAL HIGH (ref 70–99)

## 2019-07-29 LAB — HEMOGLOBIN A1C: Hgb A1c MFr Bld: 6.2 % (ref 4.6–6.5)

## 2019-07-29 NOTE — Progress Notes (Signed)
Hearing Screening   125Hz  250Hz  500Hz  1000Hz  2000Hz  3000Hz  4000Hz  6000Hz  8000Hz   Right ear:           Left ear:   20 20 20   40    Comments: Pain in right ear when inserting tip of audiometer.  Vision Screening Comments: February 2021

## 2019-07-29 NOTE — Assessment & Plan Note (Signed)
Regular symptoms but not enough for Rx

## 2019-07-29 NOTE — Assessment & Plan Note (Signed)
I have personally reviewed the Medicare Annual Wellness questionnaire and have noted 1. The patient's medical and social history 2. Their use of alcohol, tobacco or illicit drugs 3. Their current medications and supplements 4. The patient's functional ability including ADL's, fall risks, home safety risks and hearing or visual             impairment. 5. Diet and physical activities 6. Evidence for depression or mood disorders  The patients weight, height, BMI and visual acuity have been recorded in the chart I have made referrals, counseling and provided education to the patient based review of the above and I have provided the pt with a written personalized care plan for preventive services.  I have provided you with a copy of your personalized plan for preventive services. Please take the time to review along with your updated medication list.  Flu vaccine in fall Stays fit No cancer screening due to age

## 2019-07-29 NOTE — Assessment & Plan Note (Signed)
BP Readings from Last 3 Encounters:  07/29/19 138/74  07/26/19 132/82  06/07/19 140/80   Good control

## 2019-07-29 NOTE — Assessment & Plan Note (Signed)
See social history 

## 2019-07-29 NOTE — Assessment & Plan Note (Signed)
Will recheck fasting levels

## 2019-07-29 NOTE — Assessment & Plan Note (Signed)
Is on vitamin D with normal levels

## 2019-07-29 NOTE — Assessment & Plan Note (Signed)
Paced On beta blocker and eliquis

## 2019-07-29 NOTE — Assessment & Plan Note (Signed)
Discussed nephrologist---but will hold off due to stability for many years

## 2019-07-29 NOTE — Progress Notes (Signed)
Subjective:    Patient ID: Jason Lambert, male    DOB: November 17, 1934, 85 y.o.   MRN: [id number]  HPI Here for Medicare wellness visit and follow up of chronic health conditions This visit occurred during the SARS-CoV-2 public health emergency.  Safety protocols were in place, including screening questions prior to the visit, additional usage of staff PPE, and extensive cleaning of exam room while observing appropriate contact time as indicated for disinfecting solutions.   Reviewed form and advanced directives Reviewed other doctors No alcohol or tobacco Exercises regularly No falls No depression or anhedonia Vision is okay Wife notes hearing issues---has trouble with background noise Independent with instrumental ADLs No sig memory issues  No palpitations No chest pain or change in exercise tolerance (though more sedentary) No SOB No dizziness or syncope Slight edema  Creatinine fairly stable PTH elevated but normal vitamin D and clacium  Slow urinary stream at times--not consistent No sig urgency Nocturia x 2-5 times (usually 3)--tylenol will reduce awakening  Current Outpatient Medications on File Prior to Visit  Medication Sig Dispense Refill  . apixaban (ELIQUIS) 2.5 MG TABS tablet Take 1 tablet (2.5 mg total) by mouth 2 (two) times daily. 180 tablet 3  . atorvastatin (LIPITOR) 10 MG tablet Take 1 tablet (10 mg total) by mouth daily. 90 tablet 0  . hydrochlorothiazide (HYDRODIURIL) 25 MG tablet Take 1 tablet (25 mg total) by mouth daily. 90 tablet 3  . loratadine (CLARITIN) 10 MG tablet Take 10 mg by mouth daily.     . metoprolol succinate (TOPROL-XL) 100 MG 24 hr tablet Take 1 tablet (100 mg total) by mouth daily. Take with or immediately following a meal. 90 tablet 3  . Multiple Vitamins-Minerals (CENTRUM SILVER PO) Take 1 tablet by mouth daily.       No current facility-administered medications on file prior to visit.    No Known Allergies  Past Medical  History:  Diagnosis Date  . 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 Allergic rhinitis   . Atrial fibrillation ([place])   . Grave's disease   . Hyperlipidemia   . Hypertension   . Renal insufficiency   . sinus node dysfunction    s/p BTK pacemaker    Past Surgical History:  Procedure Laterality Date  . CATARACT EXTRACTION W/ INTRAOCULAR LENS  IMPLANT, BILATERAL  2012  . PACEMAKER GENERATOR CHANGE N/A 05/02/2013   Procedure: PACEMAKER GENERATOR CHANGE;  Surgeon: Deboraha Sprang, MD;  [facility];  Service: Cardiovascular;  Laterality: N/A;  . PACEMAKER INSERTION  8/06; 05/02/2013   Bradycardia/ syncope; BTK with generator change 05/02/2013 by Dr Caryl Comes  . Prostate Cancer  2007   RT  . TONSILLECTOMY  1947   adenoids    Family History  Problem Relation Age of Onset  . Cancer Mother   . Diabetes Mother   . Coronary artery disease Father        cabg  . Hypertension Neg Hx   . Prostate cancer Neg Hx   . Colon cancer Neg Hx     Social History   Socioeconomic History  . Marital status: Married    Spouse name: Not on file  . Number of children: 2  . Years of education: Not on file  . Highest education level: Not on file  Occupational History  . 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 Smoking status: Never Smoker  . Smokeless tobacco: Never Used  Substance and Sexual Activity  . Alcohol use: No    Comment: Occasional  . Drug use: No  . Sexual activity: Not on file  Other Topics Concern  . Not on file  Social History Narrative   Has a living will.    Wife to make health care decisions (then daughter [name])--formal health care POA   Would accept resuscitation   Not sure about feeding tube but no "unusual attempts"         Social Determinants of Health   Financial Resource Strain:   . Difficulty of Paying Living Expenses:   Food Insecurity:   . Worried About Charity fundraiser in the Last Year:   . Arboriculturist in  the Last Year:   Transportation Needs:   . Film/video editor (Medical):   Marland Kitchen Lack of Transportation (Non-Medical):   Physical Activity:   . Days of Exercise per Week:   . Minutes of Exercise per Session:   Stress:   . Feeling of Stress :   Social Connections:   . Frequency of Communication with Friends and Family:   . Frequency of Social Gatherings with Friends and Family:   . Attends Religious Services:   . Active Member of Clubs or Organizations:   . Attends Archivist Meetings:   Marland Kitchen Marital Status:   Intimate Partner Violence:   . Fear of Current or Ex-Partner:   . Emotionally Abused:   Marland Kitchen Physically Abused:   . Sexually Abused:    Review of Systems Appetite is fine Weight is up slightly Sleeps okay Wears seat belt Teeth fine--keeps up with dentist No other skin issues No heartburn or dysphagia Bowels move fine--no blood Right shoulder pain--seems muscular. No other joint issues    Objective:   Physical Exam  Constitutional: He is oriented to person, place, and time. He appears well-developed. No distress.  HENT:  Mouth/Throat: Oropharynx is clear and moist. No oropharyngeal exudate.  Neck: No thyromegaly present.  Cardiovascular: Normal rate, regular rhythm, normal heart sounds and intact distal pulses. Exam reveals no gallop.  No murmur heard. Respiratory: Effort normal and breath sounds normal. No respiratory distress. He has no wheezes. He has no rales.  GI: Soft. There is no abdominal tenderness.  Musculoskeletal:        General: No tenderness or edema.  Lymphadenopathy:    He has no cervical adenopathy.  Neurological: He is alert and oriented to person, place, and time.  President--- "Zoila Shutter, Obama" [phone] D-l-r-[id number]-w Recall 3/3  Skin: No rash noted. No erythema.  Lesions treated are as expected  Psychiatric: He has a normal mood and affect. His behavior is normal.           Assessment & Plan:

## 2019-09-05 ENCOUNTER — Other Ambulatory Visit: Payer: Self-pay | Admitting: Internal Medicine

## 2019-10-04 ENCOUNTER — Ambulatory Visit (INDEPENDENT_AMBULATORY_CARE_PROVIDER_SITE_OTHER): Payer: PPO | Admitting: *Deleted

## 2019-10-04 DIAGNOSIS — I495 Sick sinus syndrome: Secondary | ICD-10-CM

## 2019-10-04 LAB — CUP PACEART REMOTE DEVICE CHECK
Date Time Interrogation Session: 20210601150121
Implantable Lead Implant Date: 20060801
Implantable Lead Implant Date: 20060801
Implantable Lead Location: 753859
Implantable Lead Location: 753860
Implantable Lead Model: 350
Implantable Lead Serial Number: 24319010
Implantable Lead Serial Number: 24332742
Implantable Pulse Generator Implant Date: 20141229
Pulse Gen Serial Number: 68145068

## 2019-10-05 NOTE — Progress Notes (Signed)
Remote pacemaker transmission.   

## 2020-01-03 ENCOUNTER — Ambulatory Visit (INDEPENDENT_AMBULATORY_CARE_PROVIDER_SITE_OTHER): Payer: PPO | Admitting: *Deleted

## 2020-01-03 DIAGNOSIS — I495 Sick sinus syndrome: Secondary | ICD-10-CM | POA: Diagnosis not present

## 2020-01-03 LAB — CUP PACEART REMOTE DEVICE CHECK
Date Time Interrogation Session: 20210831095600
Implantable Lead Implant Date: 20060801
Implantable Lead Implant Date: 20060801
Implantable Lead Location: 753859
Implantable Lead Location: 753860
Implantable Lead Model: 350
Implantable Lead Serial Number: 24319010
Implantable Lead Serial Number: 24332742
Implantable Pulse Generator Implant Date: 20141229
Pulse Gen Serial Number: 68145068

## 2020-01-04 NOTE — Progress Notes (Signed)
Remote pacemaker transmission.   

## 2020-01-25 ENCOUNTER — Other Ambulatory Visit: Payer: Self-pay

## 2020-01-25 ENCOUNTER — Ambulatory Visit (INDEPENDENT_AMBULATORY_CARE_PROVIDER_SITE_OTHER): Payer: PPO

## 2020-01-25 DIAGNOSIS — Z23 Encounter for immunization: Secondary | ICD-10-CM | POA: Diagnosis not present

## 2020-04-03 ENCOUNTER — Ambulatory Visit (INDEPENDENT_AMBULATORY_CARE_PROVIDER_SITE_OTHER): Payer: PPO

## 2020-04-03 DIAGNOSIS — I495 Sick sinus syndrome: Secondary | ICD-10-CM

## 2020-04-03 LAB — CUP PACEART REMOTE DEVICE CHECK
Date Time Interrogation Session: 20211129065948
Implantable Lead Implant Date: 20060801
Implantable Lead Implant Date: 20060801
Implantable Lead Location: 753859
Implantable Lead Location: 753860
Implantable Lead Model: 350
Implantable Lead Serial Number: 24319010
Implantable Lead Serial Number: 24332742
Implantable Pulse Generator Implant Date: 20141229
Pulse Gen Serial Number: 68145068

## 2020-04-09 NOTE — Progress Notes (Signed)
Remote pacemaker transmission.   

## 2020-05-08 DIAGNOSIS — H43812 Vitreous degeneration, left eye: Secondary | ICD-10-CM | POA: Diagnosis not present

## 2020-05-24 NOTE — Telephone Encounter (Signed)
Please let the patient know that I have reached out to [employer] and they are aware of this issue. They indicated they will be shipping out new 4G devices to the patient's address listed on file. The representative then went ahead and placed an order for one to be shipped to the patient while I was on the phone with him. He indicated it will ship in ~ 14 days.

## 2020-06-07 ENCOUNTER — Other Ambulatory Visit: Payer: Self-pay | Admitting: *Deleted

## 2020-06-07 MED ORDER — APIXABAN 2.5 MG PO TABS: 2.5000 mg | ORAL_TABLET | Freq: Two times a day (BID) | ORAL | 0 refills | Status: DC

## 2020-07-03 ENCOUNTER — Ambulatory Visit (INDEPENDENT_AMBULATORY_CARE_PROVIDER_SITE_OTHER): Payer: PPO

## 2020-07-03 DIAGNOSIS — I480 Paroxysmal atrial fibrillation: Secondary | ICD-10-CM | POA: Diagnosis not present

## 2020-07-04 LAB — CUP PACEART REMOTE DEVICE CHECK
Date Time Interrogation Session: 20220302090354
Implantable Lead Implant Date: 20060801
Implantable Lead Implant Date: 20060801
Implantable Lead Location: 753859
Implantable Lead Location: 753860
Implantable Lead Model: 350
Implantable Lead Serial Number: 24319010
Implantable Lead Serial Number: 24332742
Implantable Pulse Generator Implant Date: 20141229
Pulse Gen Serial Number: 68145068

## 2020-07-11 NOTE — Progress Notes (Signed)
Remote pacemaker transmission.   

## 2020-07-31 ENCOUNTER — Encounter: Payer: Self-pay | Admitting: Internal Medicine

## 2020-07-31 ENCOUNTER — Other Ambulatory Visit
Admission: RE | Admit: 2020-07-31 | Discharge: 2020-07-31 | Disposition: A | Discharge status: Home / Self Care | Payer: PPO | Attending: Internal Medicine | Admitting: Internal Medicine

## 2020-07-31 ENCOUNTER — Ambulatory Visit (INDEPENDENT_AMBULATORY_CARE_PROVIDER_SITE_OTHER): Payer: PPO | Admitting: Internal Medicine

## 2020-07-31 ENCOUNTER — Other Ambulatory Visit: Payer: Self-pay

## 2020-07-31 VITALS — BP 120/70 | HR 69 | Ht 63.0 in | Wt 168.4 lb

## 2020-07-31 DIAGNOSIS — Z950 Presence of cardiac pacemaker: Secondary | ICD-10-CM

## 2020-07-31 DIAGNOSIS — Z79899 Other long term (current) drug therapy: Secondary | ICD-10-CM

## 2020-07-31 DIAGNOSIS — I495 Sick sinus syndrome: Secondary | ICD-10-CM | POA: Diagnosis not present

## 2020-07-31 DIAGNOSIS — I480 Paroxysmal atrial fibrillation: Secondary | ICD-10-CM | POA: Insufficient documentation

## 2020-07-31 LAB — CBC WITH DIFFERENTIAL/PLATELET
Abs Immature Granulocytes: 0.02 10*3/uL (ref 0.00–0.07)
Basophils Absolute: 0 10*3/uL (ref 0.0–0.1)
Basophils Relative: 0 %
Eosinophils Absolute: 0.1 10*3/uL (ref 0.0–0.5)
Eosinophils Relative: 1 %
HCT: 33.6 % — ABNORMAL LOW (ref 39.0–52.0)
Hemoglobin: 12.2 g/dL — ABNORMAL LOW (ref 13.0–17.0)
Immature Granulocytes: 0 %
Lymphocytes Relative: 22 %
Lymphs Abs: 1.3 10*3/uL (ref 0.7–4.0)
MCH: 34.3 pg — ABNORMAL HIGH (ref 26.0–34.0)
MCHC: 36.3 g/dL — ABNORMAL HIGH (ref 30.0–36.0)
MCV: 94.4 fL (ref 80.0–100.0)
Monocytes Absolute: 1 10*3/uL (ref 0.1–1.0)
Monocytes Relative: 17 %
Neutro Abs: 3.4 10*3/uL (ref 1.7–7.7)
Neutrophils Relative %: 60 %
Platelets: 209 10*3/uL (ref 150–400)
RBC: 3.56 MIL/uL — ABNORMAL LOW (ref 4.22–5.81)
RDW: 11.9 % (ref 11.5–15.5)
WBC: 5.8 10*3/uL (ref 4.0–10.5)
nRBC: 0 % (ref 0.0–0.2)

## 2020-07-31 LAB — BASIC METABOLIC PANEL
Anion gap: 11 (ref 5–15)
BUN: 22 mg/dL (ref 8–23)
CO2: 27 mmol/L (ref 22–32)
Calcium: 9.7 mg/dL (ref 8.9–10.3)
Chloride: 96 mmol/L — ABNORMAL LOW (ref 98–111)
Creatinine, Ser: 1.65 mg/dL — ABNORMAL HIGH (ref 0.61–1.24)
GFR, Estimated: 41 mL/min — ABNORMAL LOW (ref >60)
Glucose, Bld: 139 mg/dL — ABNORMAL HIGH (ref 70–99)
Potassium: 3.5 mmol/L (ref 3.5–5.1)
Sodium: 134 mmol/L — ABNORMAL LOW (ref 135–145)

## 2020-07-31 NOTE — Patient Instructions (Addendum)
Medication Instructions:  - Your physician recommends that you continue on your current medications as directed. Please refer to the Current Medication list given to you today.  *If you need a refill on your cardiac medications before your next appointment, please call your pharmacy*   Lab Work: - Your physician recommends that you have lab work today: BMP/ [place] Entrance at [facility] 1st desk on the right to check in, past the screening table Lab hours: Monday- Friday (7:30 am- 5:30 pm)   If you have labs (blood work) drawn today and your tests are completely normal, you will receive your results only by: Marland Kitchen MyChart Message (if you have MyChart) OR . A paper copy in the mail If you have any lab test that is abnormal or we need to change your treatment, we will call you to review the results.   Testing/Procedures: - none ordered   Follow-Up: At [facility], you and your health needs are our priority.  As part of our continuing mission to provide you with exceptional heart care, we have created designated Provider Care Teams.  These Care Teams include your primary Cardiologist (physician) and Advanced Practice Providers (APPs -  Physician Assistants and Nurse Practitioners) who all work together to provide you with the care you need, when you need it.  We recommend signing up for the patient portal called "MyChart".  Sign up information is provided on this After Visit Summary.  MyChart is used to connect with patients for Virtual Visits (Telemedicine).  Patients are able to view lab/test results, encounter notes, upcoming appointments, etc.  Non-urgent messages can be sent to your provider as well.   To learn more about what you can do with MyChart, go to [email or web address].    Your next appointment:   1 year(s)  The format for your next appointment:   In Person  Provider:   Virl Axe, MD   Other Instructions n/a

## 2020-07-31 NOTE — Addendum Note (Signed)
Addended byAlvis Lemmings C on: 07/31/2020 10:08 AM   Modules accepted: Orders

## 2020-07-31 NOTE — Progress Notes (Signed)
Patient Care Team: Venia Carbon, MD as PCP - General   HPI  Jason Lambert is a 86 y.o. male Seen in followup for pacemaker [employer] implanted for sinus node dysfunction and syncope. Generator replacement 12/14.  The patient was found to have atrial fibrillation on his device recordings.  Anticoagulation w apixoban  no bleeding  The patient denies chest pain=  nocturnal dyspnea, orthopne or peripheral edema.  There have been no palpitations, lightheadedness or syncope.  He notes some limitations in exercise with mild exertion sometimes.  He is otherwise able to exercise quite well.  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 Allergic rhinitis   . Atrial fibrillation ([place])   . Grave's disease   . Hyperlipidemia   . Hypertension   . Renal insufficiency   . sinus node dysfunction    s/p BTK pacemaker    Past Surgical History:  Procedure Laterality Date  . CATARACT EXTRACTION W/ INTRAOCULAR LENS  IMPLANT, BILATERAL  2012  . PACEMAKER GENERATOR CHANGE N/A 05/02/2013   Procedure: PACEMAKER GENERATOR CHANGE;  Surgeon: Deboraha Sprang, MD;  Location: [facility] CATH LAB;  Service: Cardiovascular;  Laterality: N/A;  . PACEMAKER INSERTION  8/06; 05/02/2013   Bradycardia/ syncope; BTK with generator change 05/02/2013 by Dr Caryl Comes  . Prostate Cancer  2007   RT  . TONSILLECTOMY  1947   adenoids    Current Outpatient Medications  Medication Sig Dispense Refill  . acetaminophen (TYLENOL) 325 MG tablet Take 325 mg by mouth every 6 (six) hours as needed.    Marland Kitchen apixaban (ELIQUIS) 2.5 MG TABS tablet Take 1 tablet (2.5 mg total) by mouth 2 (two) times daily. 180 tablet 0  . atorvastatin (LIPITOR) 10 MG tablet Take 1 tablet by mouth daily 90 tablet 3  . hydrochlorothiazide  (HYDRODIURIL) 25 MG tablet Take 1 tablet (25 mg total) by mouth daily. 90 tablet 3  . loratadine (CLARITIN) 10 MG tablet Take 10 mg by mouth daily.    . metoprolol succinate (TOPROL-XL) 100 MG 24 hr tablet Take 1 tablet (100 mg total) by mouth daily. Take with or immediately following a meal. 90 tablet 3  . Multiple Vitamins-Minerals (CENTRUM SILVER PO) Take 1 tablet by mouth daily.     No current facility-administered medications for this visit.    No Known Allergies  Review of Systems negative except from HPI and PMH  Physical Exam BP 120/70 (BP Location: Left Arm, Patient Position: Sitting, Cuff Size: Normal)   Pulse 69   Ht 5\' 3"  (1.6 m)   Wt 168 lb 6 oz (76.4 kg)   BMI 29.83 kg/m  Well developed and well nourished in no acute distress HENT normal Neck supple with JVP-flat Clear Device pocket well healed; without hematoma or erythema.  There is no tethering  Regular rate and rhythm, no murmur Abd-soft with active BS No Clubbing cyanosis   edema Skin-warm and dry A & Oriented  Grossly normal sensory and motor function  ECG a pacing 69 29/09/40   Assessment and  Plan  Sinus node dysfunction ( 99% Ap)  Pacemaker [employer].  .    \ Atrial fibrillation-paroxysmal  Neurocardiogenic syncope  Hypertension  Volume overload  Renal insufficiency  Gd 3 (GFR 37)    Atrial/rate device dependent.  There is an unusual distribution with a CLS response percentage of 4-5% (estimated) and a dearth of beats between 90 and 130.  I discussed this with [employer] and we will activate his sinus rate cap  Euvolemic continue current meds  On Anticoagulation;  No bleeding issues

## 2020-08-01 ENCOUNTER — Ambulatory Visit (INDEPENDENT_AMBULATORY_CARE_PROVIDER_SITE_OTHER): Payer: PPO | Admitting: Internal Medicine

## 2020-08-01 ENCOUNTER — Encounter: Payer: Self-pay | Admitting: Internal Medicine

## 2020-08-01 VITALS — BP 124/72 | HR 66 | Temp 97.4°F | Ht 65.0 in | Wt 166.0 lb

## 2020-08-01 DIAGNOSIS — I495 Sick sinus syndrome: Secondary | ICD-10-CM | POA: Diagnosis not present

## 2020-08-01 DIAGNOSIS — Z0000 Encounter for general adult medical examination without abnormal findings: Secondary | ICD-10-CM | POA: Diagnosis not present

## 2020-08-01 DIAGNOSIS — N1832 Chronic kidney disease, stage 3b: Secondary | ICD-10-CM

## 2020-08-01 DIAGNOSIS — I480 Paroxysmal atrial fibrillation: Secondary | ICD-10-CM | POA: Diagnosis not present

## 2020-08-01 DIAGNOSIS — R7309 Other abnormal glucose: Secondary | ICD-10-CM | POA: Diagnosis not present

## 2020-08-01 DIAGNOSIS — N2581 Secondary hyperparathyroidism of renal origin: Secondary | ICD-10-CM | POA: Diagnosis not present

## 2020-08-01 DIAGNOSIS — Z7189 Other specified counseling: Secondary | ICD-10-CM | POA: Diagnosis not present

## 2020-08-01 DIAGNOSIS — I10 Essential (primary) hypertension: Secondary | ICD-10-CM

## 2020-08-01 LAB — HEPATIC FUNCTION PANEL
ALT: 21 U/L (ref 0–53)
AST: 16 U/L (ref 0–37)
Albumin: 4.5 g/dL (ref 3.5–5.2)
Alkaline Phosphatase: 81 U/L (ref 39–117)
Bilirubin, Direct: 0.2 mg/dL (ref 0.0–0.3)
Total Bilirubin: 0.8 mg/dL (ref 0.2–1.2)
Total Protein: 7.2 g/dL (ref 6.0–8.3)

## 2020-08-01 LAB — LIPID PANEL
Cholesterol: 165 mg/dL (ref 0–200)
HDL: 36.9 mg/dL — ABNORMAL LOW (ref >39.00)
NonHDL: 127.71
Total CHOL/HDL Ratio: 4
Triglycerides: 264 mg/dL — ABNORMAL HIGH (ref 0.0–149.0)
VLDL: 52.8 mg/dL — ABNORMAL HIGH (ref 0.0–40.0)

## 2020-08-01 LAB — TSH: TSH: 2.57 u[IU]/mL (ref 0.35–4.50)

## 2020-08-01 LAB — VITAMIN D 25 HYDROXY (VIT D DEFICIENCY, FRACTURES): VITD: 48.67 ng/mL (ref 30.00–100.00)

## 2020-08-01 LAB — T4, FREE: Free T4: 0.9 ng/dL (ref 0.60–1.60)

## 2020-08-01 LAB — HEMOGLOBIN A1C: Hgb A1c MFr Bld: 6.4 % (ref 4.6–6.5)

## 2020-08-01 LAB — LDL CHOLESTEROL, DIRECT: Direct LDL: 74 mg/dL

## 2020-08-01 MED ORDER — ATORVASTATIN CALCIUM 10 MG PO TABS: 10.0000 mg | ORAL_TABLET | Freq: Every day | ORAL | 3 refills | Status: DC

## 2020-08-01 MED ORDER — METOPROLOL SUCCINATE ER 100 MG PO TB24: 100.0000 mg | ORAL_TABLET | Freq: Every day | ORAL | 3 refills | Status: DC

## 2020-08-01 MED ORDER — HYDROCHLOROTHIAZIDE 25 MG PO TABS: 25.0000 mg | ORAL_TABLET | Freq: Every day | ORAL | 3 refills | Status: DC

## 2020-08-01 NOTE — Assessment & Plan Note (Signed)
See social history 

## 2020-08-01 NOTE — Assessment & Plan Note (Signed)
I have personally reviewed the Medicare Annual Wellness questionnaire and have noted 1. The patient's medical and social history 2. Their use of alcohol, tobacco or illicit drugs 3. Their current medications and supplements 4. The patient's functional ability including ADL's, fall risks, home safety risks and hearing or visual             impairment. 5. Diet and physical activities 6. Evidence for depression or mood disorders  The patients weight, height, BMI and visual acuity have been recorded in the chart I have made referrals, counseling and provided education to the patient based review of the above and I have provided the pt with a written personalized care plan for preventive services.  I have provided you with a copy of your personalized plan for preventive services. Please take the time to review along with your updated medication list.  Done with cancer screening Stays fit COVID booster again--probably in the fall Flu vaccine in the fall

## 2020-08-01 NOTE — Assessment & Plan Note (Addendum)
Consider metformin if worsens--was 139 non fasting yesterday

## 2020-08-01 NOTE — Assessment & Plan Note (Signed)
No symptoms Continues on the apixaban

## 2020-08-01 NOTE — Progress Notes (Signed)
Subjective:    Patient ID: Jason Lambert, male    DOB: [birth year], 86 y.o.   MRN: [id number]  HPI Here for Medicare wellness visit and follow up of chronic health conditions This visit occurred during the SARS-CoV-2 public health emergency.  Safety protocols were in place, including screening questions prior to the visit, additional usage of staff PPE, and extensive cleaning of exam room while observing appropriate contact time as indicated for disinfecting solutions.   Reviewed form and advanced directives Reviewed other doctors No alcohol or tobacco Does regular exercise Vision is okay No hearing problems---recent check No falls No depression or anhedonia Independent with instrumental ADLs No sig memory issues  Keeps up with cardiologist--Dr Caryl Comes Visit yesterday Pacemaker is working fine for his SSS No palpitations No chest pain or SOB No change in exercise tolerance No dizziness or syncope No edema  GFR has been stable ~40 for many years Still on vitamin D in MVI Elevated PTH in past  Mildly elevated sugar in the past Careful with eating Weight stable  Current Outpatient Medications on File Prior to Visit  Medication Sig Dispense Refill  . acetaminophen (TYLENOL) 325 MG tablet Take 325 mg by mouth every 6 (six) hours as needed.    Marland Kitchen apixaban (ELIQUIS) 2.5 MG TABS tablet Take 1 tablet (2.5 mg total) by mouth 2 (two) times daily. 180 tablet 0  . atorvastatin (LIPITOR) 10 MG tablet Take 1 tablet by mouth daily 90 tablet 3  . hydrochlorothiazide (HYDRODIURIL) 25 MG tablet Take 1 tablet (25 mg total) by mouth daily. 90 tablet 3  . loratadine (CLARITIN) 10 MG tablet Take 10 mg by mouth daily.    . metoprolol succinate (TOPROL-XL) 100 MG 24 hr tablet Take 1 tablet (100 mg total) by mouth daily. Take with or immediately following a meal. 90 tablet 3  . Multiple Vitamins-Minerals (CENTRUM SILVER PO) Take 1 tablet by mouth daily.     No current facility-administered  medications on file prior to visit.    No Known Allergies  Past Medical History:  Diagnosis Date  . 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 Allergic rhinitis   . Atrial fibrillation ([place])   . Grave's disease   . Hyperlipidemia   . Hypertension   . Renal insufficiency   . sinus node dysfunction    s/p BTK pacemaker    Past Surgical History:  Procedure Laterality Date  . CATARACT EXTRACTION W/ INTRAOCULAR LENS  IMPLANT, BILATERAL  2012  . PACEMAKER GENERATOR CHANGE N/A 05/02/2013   Procedure: PACEMAKER GENERATOR CHANGE;  Surgeon: Deboraha Sprang, MD;  Location[facility] CATH LAB;  Service: Cardiovascular;  Laterality: N/A;  . PACEMAKER INSERTION  8/06; 05/02/2013   Bradycardia/ syncope; BTK with generator change 05/02/2013 by Dr Caryl Comes  . Prostate Cancer  2007   RT  . TONSILLECTOMY  1947   adenoids    Family History  Problem Relation Age of Onset  . Cancer Mother   . Diabetes Mother   . Coronary artery disease Father        cabg  . Hypertension Neg Hx   . Prostate cancer Neg Hx   . Colon cancer Neg Hx     Social History   Socioeconomic History  . Marital status: Married    Spouse name: Not on file  . Number of children: 2  . Years of education: Not on file  . Highest education level: Not on file  Occupational History  . 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 Smoking status: Never Smoker  . Smokeless tobacco: Never Used  Vaping Use  . Vaping Use: Never used  Substance and Sexual Activity  . Alcohol use: No    Comment: Occasional  . Drug use: No  . Sexual activity: Not on file  Other Topics Concern  . Not on file  Social History Narrative   Has a living will.    Wife to make health care decisions (then daughter [name])--formal health care POA   Would accept resuscitation   Not sure about feeding tube but no "unusual attempts"         Social Determinants of Health   Financial Resource Strain: Not on file  Food  Insecurity: Not on file  Transportation Needs: Not on file  Physical Activity: Not on file  Stress: Not on file  Social Connections: Not on file  Intimate Partner Violence: Not on file   Review of Systems Sleeps pretty well Wears seat belt Teeth okay--keeps up with dentist No suspicious skin lesions No heartburn or dysphagia Bowels move fine--no blood Usually voids okay---occasional dribbling. Nocturia x 1-2 No sig back or joint pains---uses tylenol at night    Objective:   Physical Exam Constitutional:      Appearance: Normal appearance.  HENT:     Mouth/Throat:     Comments: No lesions Eyes:     Conjunctiva/sclera: Conjunctivae normal.     Pupils: Pupils are equal, round, and reactive to light.  Cardiovascular:     Rate and Rhythm: Normal rate and regular rhythm.     Pulses: Normal pulses.     Heart sounds: No murmur heard. No gallop.   Pulmonary:     Effort: Pulmonary effort is normal.     Breath sounds: Normal breath sounds. No wheezing or rales.  Abdominal:     Palpations: Abdomen is soft.     Tenderness: There is no abdominal tenderness.  Musculoskeletal:     Cervical back: Neck supple.     Right lower leg: No edema.     Left lower leg: No edema.  Lymphadenopathy:     Cervical: No cervical adenopathy.  Skin:    General: Skin is warm.     Findings: No rash.  Neurological:     Mental Status: He is alert and oriented to person, place, and time.     Comments: President--- "Zoila Shutter, Bush----CLinton---?" [phone] D-l-r-o-w Recall 3/3  Psychiatric:        Mood and Affect: Mood normal.        Behavior: Behavior normal.            Assessment & Plan:

## 2020-08-01 NOTE — Assessment & Plan Note (Signed)
BP Readings from Last 3 Encounters:  08/01/20 124/72  07/31/20 120/70  07/29/19 138/74   Good control on metoprolol and HCTZ Will check labs

## 2020-08-01 NOTE — Assessment & Plan Note (Signed)
Has pacer and keeps up with Dr Caryl Comes

## 2020-08-01 NOTE — Progress Notes (Signed)
Hearing Screening   125Hz  250Hz  500Hz  1000Hz  2000Hz  3000Hz  4000Hz  6000Hz  8000Hz   Right ear:           Left ear:           Comments: May 2021. Did not need hearing aids.  Vision Screening Comments: January 2022

## 2020-08-01 NOTE — Assessment & Plan Note (Signed)
GFR has been stable for many years despite no ACEI/ARB Will set up with nephrology if any decline

## 2020-08-01 NOTE — Assessment & Plan Note (Signed)
Takes vitamin D

## 2020-08-02 LAB — PARATHYROID HORMONE, INTACT (NO CA): PTH: 62 pg/mL (ref 16–77)

## 2020-08-03 ENCOUNTER — Other Ambulatory Visit: Payer: Self-pay | Admitting: *Deleted

## 2020-08-03 MED ORDER — APIXABAN 2.5 MG PO TABS: 2.5000 mg | ORAL_TABLET | Freq: Two times a day (BID) | ORAL | 3 refills | Status: DC

## 2020-10-02 ENCOUNTER — Ambulatory Visit (INDEPENDENT_AMBULATORY_CARE_PROVIDER_SITE_OTHER): Payer: PPO

## 2020-10-02 DIAGNOSIS — I495 Sick sinus syndrome: Secondary | ICD-10-CM

## 2020-10-03 LAB — CUP PACEART REMOTE DEVICE CHECK
Date Time Interrogation Session: 20220531075152
Implantable Lead Implant Date: 20060801
Implantable Lead Implant Date: 20060801
Implantable Lead Location: 753859
Implantable Lead Location: 753860
Implantable Lead Model: 350
Implantable Lead Serial Number: 24319010
Implantable Lead Serial Number: 24332742
Implantable Pulse Generator Implant Date: 20141229
Pulse Gen Serial Number: 68145068

## 2020-10-24 NOTE — Progress Notes (Signed)
Remote pacemaker transmission.   

## 2021-01-01 ENCOUNTER — Ambulatory Visit (INDEPENDENT_AMBULATORY_CARE_PROVIDER_SITE_OTHER): Payer: PPO

## 2021-01-01 DIAGNOSIS — I495 Sick sinus syndrome: Secondary | ICD-10-CM

## 2021-01-01 LAB — CUP PACEART REMOTE DEVICE CHECK
Date Time Interrogation Session: 20220830175430
Implantable Lead Implant Date: 20060801
Implantable Lead Implant Date: 20060801
Implantable Lead Location: 753859
Implantable Lead Location: 753860
Implantable Lead Model: 350
Implantable Lead Serial Number: 24319010
Implantable Lead Serial Number: 24332742
Implantable Pulse Generator Implant Date: 20141229
Pulse Gen Serial Number: 68145068

## 2021-01-14 NOTE — Progress Notes (Signed)
Remote pacemaker transmission.   

## 2021-01-25 ENCOUNTER — Other Ambulatory Visit: Payer: Self-pay

## 2021-01-25 ENCOUNTER — Ambulatory Visit (INDEPENDENT_AMBULATORY_CARE_PROVIDER_SITE_OTHER): Payer: PPO

## 2021-01-25 DIAGNOSIS — Z23 Encounter for immunization: Secondary | ICD-10-CM | POA: Diagnosis not present

## 2021-04-02 ENCOUNTER — Telehealth: Payer: Self-pay | Admitting: Internal Medicine

## 2021-04-02 ENCOUNTER — Ambulatory Visit (INDEPENDENT_AMBULATORY_CARE_PROVIDER_SITE_OTHER): Payer: PPO

## 2021-04-02 DIAGNOSIS — I495 Sick sinus syndrome: Secondary | ICD-10-CM

## 2021-04-02 LAB — CUP PACEART REMOTE DEVICE CHECK
Date Time Interrogation Session: 20221129110007
Implantable Lead Implant Date: 20060801
Implantable Lead Implant Date: 20060801
Implantable Lead Location: 753859
Implantable Lead Location: 753860
Implantable Lead Model: 350
Implantable Lead Serial Number: 24319010
Implantable Lead Serial Number: 24332742
Implantable Pulse Generator Implant Date: 20141229
Pulse Gen Serial Number: 68145068

## 2021-04-02 NOTE — Progress Notes (Signed)
  Chronic Care Management   Note  04/02/2021 Name: LENWOOD BALSAM MRN: [id number] DOB: 1934/09/21  ELDIN BONSELL is a 86 y.o. year old male who is a primary care patient of [place], Theophilus Kinds, MD. I reached out to Christiane Ha by phone today in response to a referral sent by Mr. Modena Jansky Weatherly's PCP, Venia Carbon, MD.   Mr. Wilbert was given information about Chronic Care Management services today including:  CCM service includes personalized support from designated clinical staff supervised by his physician, including individualized plan of care and coordination with other care providers 24/7 contact phone numbers for assistance for urgent and routine care needs. Service will only be billed when office clinical staff spend 20 minutes or more in a month to coordinate care. Only one practitioner may furnish and bill the service in a calendar month. The patient may stop CCM services at any time (effective at the end of the month) by phone call to the office staff.   Patient agreed to services and verbal consent obtained.   Follow up plan:   Tatjana Secretary/administrator

## 2021-04-02 NOTE — Chronic Care Management (AMB) (Signed)
  Chronic Care Management   Outreach Note  04/02/2021 Name: Jason Lambert MRN: [id number] DOB: 05/03/1935  Referred by: Venia Carbon, MD Reason for referral : No chief complaint on file.   An unsuccessful telephone outreach was attempted today. The patient was referred to the pharmacist for assistance with care management and care coordination.   Follow Up Plan:   Tatjana Dellinger Upstream Scheduler

## 2021-04-11 NOTE — Progress Notes (Signed)
Remote pacemaker transmission.   

## 2021-04-30 ENCOUNTER — Other Ambulatory Visit (HOSPITAL_COMMUNITY): Payer: Self-pay

## 2021-05-13 DIAGNOSIS — H16223 Keratoconjunctivitis sicca, not specified as Sjogren's, bilateral: Secondary | ICD-10-CM | POA: Diagnosis not present

## 2021-05-20 ENCOUNTER — Telehealth: Payer: PPO

## 2021-06-17 ENCOUNTER — Telehealth: Payer: Self-pay

## 2021-06-17 NOTE — Chronic Care Management (AMB) (Signed)
° ° °  Chronic Care Management Pharmacy Assistant   Name: Jason Lambert  MRN: [id number] DOB: 02-11-1935  Jason Lambert is an 87 y.o. year old male who presents for his initial CCM visit with the clinical pharmacist.  Reason for Encounter: Initial Questions   Conditions to be addressed/monitored: HTN, HLD, and CKD Stage 3b   Recent office visits:  08/01/20-PCP-Richard Letvak,MD-Patient presented for AWV. Labs ordered (new A1c 6.4%)Screenings discussed, vaccines discussed,-Consider metformin if worsens--was 139 non fasting yesterday-no medication changes. 01/25/21-Family Medicine-Flu shot given-Fluad Quad(high Dose 65+)  Recent consult visits:  04/02/21-Cardiology-Remote pacemaker check.  Hospital visits:  None in previous 6 months  Medications: Outpatient Encounter Medications as of 06/17/2021  Medication Sig   acetaminophen (TYLENOL) 325 MG tablet Take 325 mg by mouth every 6 (six) hours as needed.   apixaban (ELIQUIS) 2.5 MG TABS tablet Take 1 tablet (2.5 mg total) by mouth 2 (two) times daily.   atorvastatin (LIPITOR) 10 MG tablet Take 1 tablet (10 mg total) by mouth daily.   hydrochlorothiazide (HYDRODIURIL) 25 MG tablet Take 1 tablet (25 mg total) by mouth daily.   loratadine (CLARITIN) 10 MG tablet Take 10 mg by mouth daily.   metoprolol succinate (TOPROL-XL) 100 MG 24 hr tablet Take 1 tablet (100 mg total) by mouth daily. Take with or immediately following a meal.   Multiple Vitamins-Minerals (CENTRUM SILVER PO) Take 1 tablet by mouth daily.   No facility-administered encounter medications on file as of 06/17/2021.     Lab Results  Component Value Date/Time   HGBA1C 6.4 08/01/2020 09:14 AM   HGBA1C 6.2 07/29/2019 08:37 AM     BP Readings from Last 3 Encounters:  08/01/20 124/72  07/31/20 120/70  07/29/19 138/74    Patient contacted to review initial questions prior to visit with Charlene Brooke.  Have you seen any other providers since your last visit with  PCP? No  Do you have any problems getting your medications?  Mail order pharmacy  [facility]    Unsuccessful attempt to reach patient. Left patient message to have all medications, supplements and any blood glucose and blood pressure readings available for review at appointment.   Star Rating Drugs:  Medication:  Last Fill: Day Supply Atorvastatin 10mg  01/30/21 90   Care Gaps: Annual wellness visit in last year? Yes Most Recent BP reading:124/72  66-P   08/01/20  Most recent A1C reading:6.4%  08/01/20  Marjo Bicker CPP notified  Avel Sensor, [place] Assistant [phone]  Total time spent for month CPA: 40 min

## 2021-06-20 ENCOUNTER — Telehealth: Payer: Self-pay | Admitting: Pharmacist

## 2021-06-20 ENCOUNTER — Telehealth: Payer: PPO

## 2021-06-20 NOTE — Progress Notes (Unsigned)
Chronic Care Management Pharmacy Note  06/20/2021 Name:  Jason Lambert MRN:  [id number] DOB:  29-Jul-1934  Summary: ***  Recommendations/Changes made from today's visit: ***  Plan: -[name] will call patient *** -Pharmacist follow up televisit scheduled for *** -PCP AWV 08/06/21   Subjective: Jason Lambert is an 87 y.o. year old male who is a primary patient of Venia Carbon, MD.  The CCM team was consulted for assistance with disease management and care coordination needs.    Engaged with patient by telephone for initial visit in response to provider referral for pharmacy case management and/or care coordination services.   Consent to Services:  The patient was given the following information about Chronic Care Management services today, agreed to services, and gave verbal consent: 1. CCM service includes personalized support from designated clinical staff supervised by the primary care provider, including individualized plan of care and coordination with other care providers 2. 24/7 contact phone numbers for assistance for urgent and routine care needs. 3. Service will only be billed when office clinical staff spend 20 minutes or more in a month to coordinate care. 4. Only one practitioner may furnish and bill the service in a calendar month. 5.The patient may stop CCM services at any time (effective at the end of the month) by phone call to the office staff. 6. The patient will be responsible for cost sharing (co-pay) of up to 20% of the service fee (after annual deductible is met). Patient agreed to services and consent obtained.  Patient Care Team: Venia Carbon, MD as PCP - General Lenord Fellers Cleaster Cor[place], [place] [facility] as Pharmacist (Pharmacist)  Recent office visits: 08/01/20-PCP-Richard Letvak,MD-Patient presented for AWV. Labs ordered (new A1c 6.4%)Screenings discussed, vaccines discussed,-Consider metformin if worsens--was 139 non fasting yesterday-no medication  changes.  01/25/21-Family Medicine-Flu shot given  Recent consult visits: 04/02/21-Cardiology-Remote pacemaker check.  Hospital visits: None in previous 6 months   Objective:  Lab Results  Component Value Date   CREATININE 1.65 (H) 07/31/2020   BUN 22 07/31/2020   GFR 40.35 (L) 07/26/2019   GFRNONAA 41 (L) 07/31/2020   GFRAA 52 (L) 06/08/2018   NA 134 (L) 07/31/2020   K 3.5 07/31/2020   CALCIUM 9.7 07/31/2020   CO2 27 07/31/2020   GLUCOSE 139 (H) 07/31/2020    Lab Results  Component Value Date/Time   HGBA1C 6.4 08/01/2020 09:14 AM   HGBA1C 6.2 07/29/2019 08:37 AM   GFR 40.35 (L) 07/26/2019 10:38 AM   GFR 37.02 (L) 11/09/2018 11:31 AM    Last diabetic Eye exam: No results found for: HMDIABEYEEXA  Last diabetic Foot exam: No results found for: HMDIABFOOTEX   Lab Results  Component Value Date   CHOL 165 08/01/2020   HDL 36.90 (L) 08/01/2020   LDLCALC 72 04/08/2016   LDLDIRECT 74.0 08/01/2020   TRIG 264.0 (H) 08/01/2020   CHOLHDL 4 08/01/2020    Hepatic Function Latest Ref Rng & Units 08/01/2020 07/26/2019 07/26/2019  Total Protein 6.0 - 8.3 g/dL 7.2 6.9 -  Albumin 3.5 - 5.2 g/dL 4.5 4.4 4.4  AST 0 - 37 U/L 16 10 -  ALT 0 - 53 U/L 21 18 -  Alk Phosphatase 39 - 117 U/L 81 90 -  Total Bilirubin 0.2 - 1.2 mg/dL 0.8 1.0 -  Bilirubin, Direct 0.0 - 0.3 mg/dL 0.2 0.2 -    Lab Results  Component Value Date/Time   TSH 2.57 08/01/2020 09:14 AM   TSH 2.66 07/26/2019 10:38 AM  FREET4 0.90 08/01/2020 09:14 AM   FREET4 0.91 07/26/2019 10:38 AM    CBC Latest Ref Rng & Units 07/31/2020 07/26/2019 06/08/2018  WBC 4.0 - 10.5 K/uL 5.8 5.5 6.1  Hemoglobin 13.0 - 17.0 g/dL 12.2(L) 12.4(L) 12.6(L)  Hematocrit 39.0 - 52.0 % 33.6(L) 35.0(L) 35.3(L)  Platelets 150 - 400 K/uL 209 242.0 207    Lab Results  Component Value Date/Time   VD25OH 48.67 08/01/2020 09:14 AM   VD25OH 59.04 07/26/2019 10:38 AM    Clinical ASCVD: No  The ASCVD Risk score (Arnett DK, et al., 2019) failed  to calculate for the following reasons:   The 2019 ASCVD risk score is only valid for ages 61 to 71    Depression screen PHQ 2/9 08/01/2020 07/29/2019 06/23/2018  Decreased Interest 0 0 0  Down, Depressed, Hopeless 0 0 0  PHQ - 2 Score 0 0 0    CHA2DS2/VAS Stroke Risk Points  Current as of yesterday     3 >= 2 Points: High Risk  1 - 1.99 Points: Medium Risk  0 Points: Low Risk    Last Change: N/A      Points Metrics  0 Has Congestive Heart Failure:  No    Current as of yesterday  0 Has Vascular Disease:  No    Current as of yesterday  1 Has Hypertension:  Yes    Current as of yesterday  2 Age:  60    Current as of yesterday  0 Has Diabetes:  No    Current as of yesterday  0 Had Stroke:  No  Had TIA:  No  Had Thromboembolism:  No    Current as of yesterday  0 Male:  No    Current as of yesterday     Social History   Tobacco Use  Smoking Status Never  Smokeless Tobacco Never   BP Readings from Last 3 Encounters:  08/01/20 124/72  07/31/20 120/70  07/29/19 138/74   Pulse Readings from Last 3 Encounters:  08/01/20 66  07/31/20 69  07/29/19 65   Wt Readings from Last 3 Encounters:  08/01/20 166 lb (75.3 kg)  07/31/20 168 lb 6 oz (76.4 kg)  07/29/19 166 lb (75.3 kg)   BMI Readings from Last 3 Encounters:  08/01/20 27.62 kg/m  07/31/20 29.83 kg/m  07/29/19 27.62 kg/m    Assessment/Interventions: Review of patient past medical history, allergies, medications, health status, including review of consultants reports, laboratory and other test data, was performed as part of comprehensive evaluation and provision of chronic care management services.   SDOH:  (Social Determinants of Health) assessments and interventions performed: {yes/no:20286}  SDOH Screenings   Alcohol Screen: Not on file  Depression (PHQ2-9): Low Risk    PHQ-2 Score: 0  Financial Resource Strain: Not on file  Food Insecurity: Not on file  Housing: Not on file  Physical Activity: Not on  file  Social Connections: Not on file  Stress: Not on file  Tobacco Use: Low Risk    Smoking Tobacco Use: Never   Smokeless Tobacco Use: Never   Passive Exposure: Not on file  Transportation Needs: Not on file    [place]  No Known Allergies  Medications Reviewed Today     Reviewed by Venia Carbon, MD (Physician) on 08/01/20 at [place] List Status: <None>   Medication Order Taking? Sig Documenting Provider Last Dose Status Informant  acetaminophen (TYLENOL) 325 MG tablet [id number] Yes Take 325 mg by mouth  every 6 (six) hours as needed. [provider] Taking Active   apixaban (ELIQUIS) 2.5 MG TABS tablet [id number] Yes Take 1 tablet (2.5 mg total) by mouth 2 (two) times daily. Deboraha Sprang, MD Taking Active   atorvastatin (LIPITOR) 10 MG tablet [id number] Yes Take 1 tablet by mouth daily Venia Carbon, MD Taking Active   hydrochlorothiazide (HYDRODIURIL) 25 MG tablet [id number] Yes Take 1 tablet (25 mg total) by mouth daily. Venia Carbon, MD Taking Active   loratadine (CLARITIN) 10 MG tablet [id number] Yes Take 10 mg by mouth daily. [provider] Taking Active Self  metoprolol succinate (TOPROL-XL) 100 MG 24 hr tablet [id number] Yes Take 1 tablet (100 mg total) by mouth daily. Take with or immediately following a meal. Venia Carbon, MD Taking Active   Multiple Vitamins-Minerals (CENTRUM SILVER PO) [id number] Yes Take 1 tablet by mouth daily. [provider] Taking Active Self            Patient Active Problem List   Diagnosis Date Noted   Elevated glucose 07/29/2019   Secondary hyperparathyroidism of renal origin ([place]) 07/27/2019   Headache 10/05/2018   BPH with obstruction/lower urinary tract symptoms 06/23/2018   Atrial fibrillation ([place])    Seborrheic keratoses 08/30/2014   Advanced directives, counseling/discussion 04/12/2014   Sick sinus syndrome ([place])    Pacemaker - BIOTRONIK    Routine general medical examination at  a health care facility 06/24/2011   Stage 3b chronic kidney disease ([place]) 07/19/2008   PROSTATE CANCER, HX OF 01/11/2008   Graves disease 01/06/2007   Hyperlipemia 01/06/2007   Essential hypertension, benign 01/06/2007   ALLERGIC RHINITIS 01/06/2007    Immunization History  Administered Date(s) Administered   Fluad Quad(high Dose 65+) 01/04/2019, 01/25/2020, 01/25/2021   Influenza Split 03/03/2013   Influenza, Seasonal, Injecte, Preservative Fre 02/18/2015, 01/18/2016   Influenza,inj,Quad PF,6+ Mos 02/10/2017, 01/28/2018   Influenza-Unspecified 03/04/2014   Moderna Sars-Covid-2 Vaccination 05/19/2019, 06/16/2019, 03/20/2020   Pneumococcal Conjugate-13 04/12/2014   Pneumococcal Polysaccharide-23 01/06/2007, 06/18/2017   Td 05/06/2003, 04/12/2014   Zoster Recombinat (Shingrix) 12/08/2018, 03/21/2019   Zoster, Live 05/05/2012    Conditions to be addressed/monitored:  Hypertension, Hyperlipidemia, Atrial Fibrillation, and Chronic Kidney Disease  There are no care plans that you recently modified to display for this patient.    Medication Assistance: {MEDASSISTANCEINFO:25044}  Compliance/Adherence/Medication fill history: Care Gaps: None  Star-Rating Drugs: Atorvastatin - LF 05/05/21 x 90 ds; [place] 100%  Patient's preferred pharmacy is:  [facility] [address] [place] [address] Phone: [phone] Fax: [phone]  Uses pill box? {Yes or If no, why not?:20788} Pt endorses ***% compliance  We discussed: {Pharmacy options:24294} Patient decided to: {US Pharmacy Plan:23885}  Care Plan and Follow Up Patient Decision:  {FOLLOWUP:24991}  Plan: {CM FOLLOW UP AQTM:22633}  ***    Current Barriers:  {pharmacybarriers:24917}  Pharmacist Clinical Goal(s):  Patient will {PHARMACYGOALCHOICES:24921} through collaboration with PharmD and provider.   Interventions: 1:1 collaboration with  Venia Carbon, MD regarding development and update of comprehensive plan of care as evidenced by provider attestation and co-signature Inter-disciplinary care team collaboration (see longitudinal plan of care) Comprehensive medication review performed; medication list updated in electronic medical record  Hypertension / CKD stage 3b (BP goal <130/80) -{US controlled/uncontrolled:25276} -Current treatment: HCTZ 25 mg daily Metoprolol succinate 100 mg daily -Medications previously tried: ***  -Current home readings: *** -Current dietary habits: *** -Current exercise  habits: *** -{ACTIONS;DENIES/REPORTS:21021675::"Denies"} hypotensive/hypertensive symptoms -Educated on {CCM BP Counseling:25124} -Counseled to monitor BP at home ***, document, and provide log at future appointments -{CCMPHARMDINTERVENTION:25122}  Hyperlipidemia: (LDL goal < 100) -{US controlled/uncontrolled:25276} -Current treatment: Atorvastatin 10 mg daily -Medications previously tried: ***  -Current dietary patterns: *** -Current exercise habits: *** -Educated on {CCM HLD Counseling:25126} -{CCMPHARMDINTERVENTION:25122}  Atrial Fibrillation (Goal: prevent stroke and major bleeding) -{US controlled/uncontrolled:25276} -CHADSVASC: 3; hx sick sinus syndrome s/p pacemaker; follows with Dr Caryl Comes -Current treatment: Metoprolol succinate 100 mg daily Eliquis 2.5 mg BID SCr > 1.5 and age > 31 -Medications previously tried: *** -Home BP and HR readings: ***  -Counseled on {CCMAFIBCOUNSELING:25120} -{CCMPHARMDINTERVENTION:25122}  Prediabetes (Goal: prevent progression to DM) -{US controlled/uncontrolled:25276} - A1c 6.4% -{CCMPHARMDINTERVENTION:25122}  Health Maintenance -Vaccine gaps: *** -Current therapy:  Tylenol 325 mg PRN Loratadine 10 mg daily Multivitamin -Educated on {ccm supplement counseling:25128} -{CCM Patient satisfied:25129} -{CCMPHARMDINTERVENTION:25122}  Patient Goals/Self-Care  Activities Patient will:  - {pharmacypatientgoals:24919}

## 2021-06-20 NOTE — Telephone Encounter (Signed)
°  Chronic Care Management   Outreach Note  06/20/2021 Name: COLON RUETH MRN: [id number] DOB: 07/13/1934  Referred by: Venia Carbon, MD  Patient had a phone appointment scheduled with clinical pharmacist today.  An unsuccessful telephone outreach was attempted today. The patient was referred to the pharmacist for assistance with medications, care management and care coordination.   Patient will NOT be penalized in any way for missing a CCM appointment. The no-show fee does not apply.  If possible, a message was left to return call to: [phone] or to [facility].  Charlene Brooke, PharmD, BCACP Clinical Pharmacist [facility] [phone]

## 2021-07-02 ENCOUNTER — Ambulatory Visit (INDEPENDENT_AMBULATORY_CARE_PROVIDER_SITE_OTHER): Payer: PPO

## 2021-07-02 DIAGNOSIS — I495 Sick sinus syndrome: Secondary | ICD-10-CM | POA: Diagnosis not present

## 2021-07-02 LAB — CUP PACEART REMOTE DEVICE CHECK
Date Time Interrogation Session: 20230228090215
Implantable Lead Implant Date: 20060801
Implantable Lead Implant Date: 20060801
Implantable Lead Location: 753859
Implantable Lead Location: 753860
Implantable Lead Model: 350
Implantable Lead Serial Number: 24319010
Implantable Lead Serial Number: 24332742
Implantable Pulse Generator Implant Date: 20141229
Pulse Gen Serial Number: 68145068

## 2021-07-09 NOTE — Progress Notes (Signed)
Remote pacemaker transmission.   

## 2021-07-30 ENCOUNTER — Ambulatory Visit (INDEPENDENT_AMBULATORY_CARE_PROVIDER_SITE_OTHER): Payer: PPO | Admitting: Internal Medicine

## 2021-07-30 ENCOUNTER — Encounter: Payer: Self-pay | Admitting: Internal Medicine

## 2021-07-30 ENCOUNTER — Other Ambulatory Visit: Payer: Self-pay

## 2021-07-30 VITALS — BP 136/76 | HR 69 | Ht 65.0 in | Wt 168.0 lb

## 2021-07-30 DIAGNOSIS — I495 Sick sinus syndrome: Secondary | ICD-10-CM

## 2021-07-30 DIAGNOSIS — Z79899 Other long term (current) drug therapy: Secondary | ICD-10-CM | POA: Diagnosis not present

## 2021-07-30 DIAGNOSIS — I480 Paroxysmal atrial fibrillation: Secondary | ICD-10-CM | POA: Diagnosis not present

## 2021-07-30 DIAGNOSIS — Z950 Presence of cardiac pacemaker: Secondary | ICD-10-CM | POA: Diagnosis not present

## 2021-07-30 LAB — PACEMAKER DEVICE OBSERVATION

## 2021-07-30 NOTE — Progress Notes (Signed)
? ? ? ? ?Patient Care Team: ?Venia Carbon, MD as PCP - General ?Charlton Haws, [facility] as Pharmacist (Pharmacist) ? ? ?HPI ? ?Jason Lambert is a 87 y.o. male ?Seen in followup for pacemaker Biotronik implanted for sinus node dysfunction and syncope. Generator replacement 12/14. ? ?Found to have atrial fibrillation on his device recordings.  Anticoagulation w apixoban without bleeding ?  ? ?The patient denies chest pain, shortness of breath, nocturnal dyspnea, orthopnea or peripheral edema.  There have been no palpitations or syncope.  Complains of episodes of lightheadedness which have been going on for about 10-15 years typically provoked by moderate exertion accompanied by a sense of warmth.  Aborted by lying down, sitting is often insufficient and symptoms will persist for up to 15-30 minutes.  No change in the frequency of these events or the triggering threshold ? ?[name] ?  ? ?Date Cr K Hgb  ?12/16 1.46 4.6 12.9  ?1/18 1.63 4.9 12.3  ?7/20 1.77 4.0 12.6(2/20)  ?3/21 1.64 4.2 12.4  ?3/22 1.65 3.5 122  ?   ? ?Past Medical History:  ?Diagnosis Date  ? Adenocarcinoma ([place])   ? Prostate  ? Allergic rhinitis   ? Atrial fibrillation ([place])   ? Grave's disease   ? Hyperlipidemia   ? Hypertension   ? Renal insufficiency   ? sinus node dysfunction   ? s/p BTK pacemaker  ? ? ?Past Surgical History:  ?Procedure Laterality Date  ? CATARACT EXTRACTION W/ INTRAOCULAR LENS  IMPLANT, BILATERAL  2012  ? PACEMAKER GENERATOR CHANGE N/A 05/02/2013  ? Procedure: PACEMAKER GENERATOR CHANGE;  Surgeon: Deboraha Sprang, MD;  Location: [facility] CATH LAB;  Service: Cardiovascular;  Laterality: N/A;  ? PACEMAKER INSERTION  8/06; 05/02/2013  ? Bradycardia/ syncope; BTK with generator change 05/02/2013 by Dr Caryl Comes  ? Prostate Cancer  2007  ? RT  ? TONSILLECTOMY  1947  ? adenoids  ? ? ?Current Outpatient Medications  ?Medication Sig Dispense Refill  ? acetaminophen (TYLENOL) 325 MG tablet Take 325 mg by  mouth every 6 (six) hours as needed.    ? apixaban (ELIQUIS) 2.5 MG TABS tablet Take 1 tablet (2.5 mg total) by mouth 2 (two) times daily. 180 tablet 3  ? atorvastatin (LIPITOR) 10 MG tablet Take 1 tablet (10 mg total) by mouth daily. 90 tablet 3  ? hydrochlorothiazide (HYDRODIURIL) 25 MG tablet Take 1 tablet (25 mg total) by mouth daily. 90 tablet 3  ? loratadine (CLARITIN) 10 MG tablet Take 10 mg by mouth daily.    ? metoprolol succinate (TOPROL-XL) 100 MG 24 hr tablet Take 1 tablet (100 mg total) by mouth daily. Take with or immediately following a meal. 90 tablet 3  ? Multiple Vitamins-Minerals (CENTRUM SILVER PO) Take 1 tablet by mouth daily.    ? ?No current facility-administered medications for this visit.  ? ? ?No Known Allergies ? ?Review of Systems negative except from HPI and PMH ? ?Physical Exam ?BP 136/76 (BP Location: Left Arm, Patient Position: Sitting, Cuff Size: Normal)   Pulse 69   Ht '5\' 5"'$  (1.651 m)   Wt 168 lb (76.2 kg)   SpO2 98%   BMI 27.96 kg/m?  ?Well developed and well nourished in no acute distress ?HENT normal ?Neck supple with JVP-flat ?Clear ?Device pocket well healed; without hematoma or erythema.  There is no tethering  ?Regular rate and rhythm, no  murmur ?Abd-soft with active BS ?No Clubbing cyanosis  edema ?  Skin-warm and dry ?A & Oriented  Grossly normal sensory and motor function ? ?ECG atrial pacing at 69 ?Interval 24/10/40 ?Otherwise normal ? ?Assessment and  Plan ? ?Sinus node dysfunction ( 99% Ap) ? ?Pacemaker Biotoronik.  Marland Kitchen    ?\ ?Atrial fibrillation-paroxysmal ? ?Neurocardiogenic syncope ? ?Hypertension ? ?Volume overload ? ?Renal insufficiency  Gd 3 (GFR 37)  ? ?  ?Sinus node dysfunction with adequate heart rate excursion minimal symptoms. ? ?Longstanding hypertension, dating back to the 90s.  Has been managed with metoprolol.  Not sure that makes the most sense given the paucity of data as to his long-term benefits.  His atrial fibrillation has not been a problem.   There may be some protective effects with his neurocardiogenic syncope so we will continue it at this juncture ? ?Hydrochlorothiazide he said was started because he was hyperkalemic.  Reviewing the chart, he did get hyperkalemic on losartan hence he is not on an ARB.  I wonder, in the setting of these events with exertion whether a diuretic is beneficial or not, given the longstanding nature and the lack of change, we will continue to monitor for now. ? ?Apixaban appropriately dosed for age and renal function.  We will recheck his metabolic profile today.  In the absence of bleeding continue his apixaban and check a CBC. ? ? ? ? ?

## 2021-07-30 NOTE — Patient Instructions (Signed)
Medication Instructions:  ?- Your physician recommends that you continue on your current medications as directed. Please refer to the Current Medication list given to you today. ? ?*If you need a refill on your cardiac medications before your next appointment, please call your pharmacy* ? ? ?Lab Work: ?- Your physician recommends that you have lab work today: BMP/ CBC ? ?If you have labs (blood work) drawn today and your tests are completely normal, you will receive your results only by: ?MyChart Message (if you have MyChart) OR ?A paper copy in the mail ?If you have any lab test that is abnormal or we need to change your treatment, we will call you to review the results. ? ? ?Testing/Procedures: ?- none ordered ? ? ?Follow-Up: ?At [facility], you and your health needs are our priority.  As part of our continuing mission to provide you with exceptional heart care, we have created designated Provider Care Teams.  These Care Teams include your primary Cardiologist (physician) and Advanced Practice Providers (APPs -  Physician Assistants and Nurse Practitioners) who all work together to provide you with the care you need, when you need it. ? ?We recommend signing up for the patient portal called "MyChart".  Sign up information is provided on this After Visit Summary.  MyChart is used to connect with patients for Virtual Visits (Telemedicine).  Patients are able to view lab/test results, encounter notes, upcoming appointments, etc.  Non-urgent messages can be sent to your provider as well.   ?To learn more about what you can do with MyChart, go to [email or web address].   ? ?Your next appointment:   ?1 year(s) ? ?The format for your next appointment:   ?In Person ? ?Provider:   ?Virl Axe, MD  ? ? ?Other Instructions ?N/a ? ?

## 2021-07-31 LAB — BASIC METABOLIC PANEL
BUN/Creatinine Ratio: 15 (ref 10–24)
BUN: 24 mg/dL (ref 8–27)
CO2: 24 mmol/L (ref 20–29)
Calcium: 9.7 mg/dL (ref 8.6–10.2)
Chloride: 98 mmol/L (ref 96–106)
Creatinine, Ser: 1.59 mg/dL — ABNORMAL HIGH (ref 0.76–1.27)
Glucose: 125 mg/dL — ABNORMAL HIGH (ref 70–99)
Potassium: 3.8 mmol/L (ref 3.5–5.2)
Sodium: 138 mmol/L (ref 134–144)
eGFR: 43 mL/min/{1.73_m2} — ABNORMAL LOW (ref >59)

## 2021-07-31 LAB — CBC
Hematocrit: 34.3 % — ABNORMAL LOW (ref 37.5–51.0)
Hemoglobin: 12.1 g/dL — ABNORMAL LOW (ref 13.0–17.7)
MCH: 34 pg — ABNORMAL HIGH (ref 26.6–33.0)
MCHC: 35.3 g/dL (ref 31.5–35.7)
MCV: 96 fL (ref 79–97)
Platelets: 224 10*3/uL (ref 150–450)
RBC: 3.56 x10E6/uL — ABNORMAL LOW (ref 4.14–5.80)
RDW: 12.3 % (ref 11.6–15.4)
WBC: 6 10*3/uL (ref 3.4–10.8)

## 2021-08-06 ENCOUNTER — Other Ambulatory Visit (HOSPITAL_COMMUNITY): Payer: Self-pay

## 2021-08-06 ENCOUNTER — Encounter: Payer: Self-pay | Admitting: Internal Medicine

## 2021-08-06 ENCOUNTER — Ambulatory Visit (INDEPENDENT_AMBULATORY_CARE_PROVIDER_SITE_OTHER): Payer: PPO | Admitting: Internal Medicine

## 2021-08-06 VITALS — BP 138/74 | HR 73 | Temp 97.6°F | Ht 65.0 in | Wt 167.0 lb

## 2021-08-06 DIAGNOSIS — N1832 Chronic kidney disease, stage 3b: Secondary | ICD-10-CM | POA: Diagnosis not present

## 2021-08-06 DIAGNOSIS — I10 Essential (primary) hypertension: Secondary | ICD-10-CM

## 2021-08-06 DIAGNOSIS — I4891 Unspecified atrial fibrillation: Secondary | ICD-10-CM | POA: Diagnosis not present

## 2021-08-06 DIAGNOSIS — Z0000 Encounter for general adult medical examination without abnormal findings: Secondary | ICD-10-CM

## 2021-08-06 DIAGNOSIS — I495 Sick sinus syndrome: Secondary | ICD-10-CM

## 2021-08-06 DIAGNOSIS — N2581 Secondary hyperparathyroidism of renal origin: Secondary | ICD-10-CM | POA: Diagnosis not present

## 2021-08-06 MED ORDER — HYDROCHLOROTHIAZIDE 25 MG PO TABS
25.0000 mg | ORAL_TABLET | Freq: Every day | ORAL | 3 refills | Status: DC
  Filled 2021-08-06: qty 90, 90d supply, fill #0
  Filled 2021-11-10: qty 90, 90d supply, fill #1
  Filled 2022-02-28: qty 90, 90d supply, fill #2
  Filled 2022-05-16: qty 90, 90d supply, fill #3

## 2021-08-06 MED ORDER — METOPROLOL SUCCINATE ER 100 MG PO TB24
100.0000 mg | ORAL_TABLET | Freq: Every day | ORAL | 3 refills | Status: DC
  Filled 2021-08-06: qty 90, 90d supply, fill #0
  Filled 2021-11-10: qty 90, 90d supply, fill #1
  Filled 2022-02-28: qty 90, 90d supply, fill #2
  Filled 2022-05-16: qty 90, 90d supply, fill #3

## 2021-08-06 MED ORDER — ATORVASTATIN CALCIUM 10 MG PO TABS
10.0000 mg | ORAL_TABLET | Freq: Every day | ORAL | 3 refills | Status: DC
  Filled 2021-08-06: qty 90, 90d supply, fill #0
  Filled 2021-11-10: qty 90, 90d supply, fill #1
  Filled 2022-02-28: qty 90, 90d supply, fill #2
  Filled 2022-05-16: qty 90, 90d supply, fill #3

## 2021-08-06 NOTE — Progress Notes (Signed)
? ?Subjective:  ? ? Patient ID: Jason Lambert, male    DOB: March 21, 1935, 87 y.o.   MRN: [id number] ? ?HPI ?Here for Medicare wellness visit and follow up of chronic health conditions ?Reviewed advanced directives ?Reviewed other doctors---Dr Klein---[facility], [facility], Dr Dingledein--ophthal ?No hospitalizations or surgery this year ?No tobacco ?Rare alcohol ?Regular exercise still--- 5 days per week ?Vision is okay ?No problems with hearing ?No falls  ?No depression or anhedonia ?Independent with instrumental ADLs ?No apparent memory issues ? ?No heart issues ?No chest pain or SOB ?No palpitations  ?Will get lightheaded if he pushes with exercises ?No syncope ?No edema lately ? ?Pacemaker working fine ?Gets it checked quarterly ? ?Tylenol at night ?Helps with aching ?No sig joint or back pains ? ?Urinary stream slow at times--strains at times ?Empties okay eventually ?Stable nocturia x 1-2 ? ?Last GFR stable at 43 ?Elevated PTH in the past---wasn't done on recent labs ? ?Current Outpatient Medications on File Prior to Visit  ?Medication Sig Dispense Refill  ? acetaminophen (TYLENOL) 325 MG tablet Take 325 mg by mouth every 6 (six) hours as needed.    ? apixaban (ELIQUIS) 2.5 MG TABS tablet Take 1 tablet (2.5 mg total) by mouth 2 (two) times daily. 180 tablet 3  ? atorvastatin (LIPITOR) 10 MG tablet Take 1 tablet (10 mg total) by mouth daily. 90 tablet 3  ? hydrochlorothiazide (HYDRODIURIL) 25 MG tablet Take 1 tablet (25 mg total) by mouth daily. 90 tablet 3  ? loratadine (CLARITIN) 10 MG tablet Take 10 mg by mouth daily.    ? metoprolol succinate (TOPROL-XL) 100 MG 24 hr tablet Take 1 tablet (100 mg total) by mouth daily. Take with or immediately following a meal. 90 tablet 3  ? Multiple Vitamins-Minerals (CENTRUM SILVER PO) Take 1 tablet by mouth daily.    ? ?No current facility-administered medications on file prior to visit.  ? ? ?No Known Allergies ? ?Past Medical History:  ?Diagnosis Date  ?  Adenocarcinoma ([place])   ? Prostate  ? Allergic rhinitis   ? Atrial fibrillation ([place])   ? Grave's disease   ? Hyperlipidemia   ? Hypertension   ? Renal insufficiency   ? sinus node dysfunction   ? s/p BTK pacemaker  ? ? ?Past Surgical History:  ?Procedure Laterality Date  ? CATARACT EXTRACTION W/ INTRAOCULAR LENS  IMPLANT, BILATERAL  2012  ? PACEMAKER GENERATOR CHANGE N/A 05/02/2013  ? Procedure: PACEMAKER GENERATOR CHANGE;  Surgeon: Deboraha Sprang, MD;  Location: [facility] C[facility] LAB;  Service: Cardiovascular;  Laterality: N/A;  ? PACEMAKER INSERTION  8/06; 05/02/2013  ? Bradycardia/ syncope; BTK with generator change 05/02/2013 by Dr Caryl Comes  ? Prostate Cancer  2007  ? RT  ? TONSILLECTOMY  1947  ? adenoids  ? ? ?Family History  ?Problem Relation Age of Onset  ? Cancer Mother   ? Diabetes Mother   ? Coronary artery disease Father   ?     cabg  ? Hypertension Neg Hx   ? Prostate cancer Neg Hx   ? Colon cancer Neg Hx   ? ? ?Social History  ? ?Socioeconomic History  ? Marital status: Married  ?  Spouse name: Not on file  ? Number of children: 2  ? Years of education: Not on file  ? Highest education level: Not on file  ?Occupational History  ? Occupation: Retired  ?  Comment: Art gallery manager with [employer]  ?Tobacco Use  ? Smoking status: Never  ?  Passive exposure: Never  ? Smokeless tobacco: Never  ?Vaping Use  ? Vaping Use: Never used  ?Substance and Sexual Activity  ? Alcohol use: No  ?  Comment: Occasional  ? Drug use: No  ? Sexual activity: Not on file  ?Other Topics Concern  ? Not on file  ?Social History Narrative  ? Has a living will.   ? Wife to make health care decisions (then daughter [name])--formal health care POA  ? Would accept resuscitation  ? Not sure about feeding tube but no "unusual attempts"  ?   ?   ? ?Social Determinants of Health  ? ?Financial Resource Strain: Not on file  ?Food Insecurity: Not on file  ?Transportation Needs: Not on file  ?Physical Activity: Not on file  ?Stress: Not on file  ?Social  Connections: Not on file  ?Intimate Partner Violence: Not on file  ? ?Review of Systems ?Appetite is good ?Weight is stable ?Sleeps well ?Wears seat belt ?Teeth okay--keeps up with dentist ?No heartburn or dysphagia ?Rare headaches ?Has spot on back to be checked--not new ?Bowels move fine---no blood ?Recent labs were non fasting---glucose 125 ?   ?Objective:  ? Physical Exam ?Constitutional:   ?   Appearance: Normal appearance.  ?HENT:  ?   Mouth/Throat:  ?   Comments: No lesions ?Eyes:  ?   Conjunctiva/sclera: Conjunctivae normal.  ?   Pupils: Pupils are equal, round, and reactive to light.  ?Cardiovascular:  ?   Rate and Rhythm: Normal rate and regular rhythm.  ?   Pulses: Normal pulses.  ?   Heart sounds: No murmur heard. ?  No gallop.  ?Pulmonary:  ?   Effort: Pulmonary effort is normal.  ?   Breath sounds: Normal breath sounds. No wheezing or rales.  ?Abdominal:  ?   Palpations: Abdomen is soft.  ?   Tenderness: There is no abdominal tenderness.  ?Musculoskeletal:  ?   Cervical back: Neck supple.  ?   Right lower leg: No edema.  ?   Left lower leg: No edema.  ?Lymphadenopathy:  ?   Cervical: No cervical adenopathy.  ?Skin: ?   Findings: No lesion or rash.  ?   Comments: Large seb keratosis on low mid back  ?Neurological:  ?   General: No focal deficit present.  ?   Mental Status: He is alert and oriented to person, place, and time.  ?   Comments: Mini-Cog normal  ?Psychiatric:     ?   Mood and Affect: Mood normal.     ?   Behavior: Behavior normal.  ?  ? ? ? ? ?   ?Assessment & Plan:  ? ?

## 2021-08-06 NOTE — Assessment & Plan Note (Signed)
BP Readings from Last 3 Encounters:  ?08/06/21 138/74  ?07/30/21 136/76  ?08/01/20 124/72  ? ?Good control on HCTZ 25 and metoprolol '100mg'$  daily ?

## 2021-08-06 NOTE — Assessment & Plan Note (Signed)
I have personally reviewed the Medicare Annual Wellness questionnaire and have noted ?1. The patient's medical and social history ?2. Their use of alcohol, tobacco or illicit drugs ?3. Their current medications and supplements ?4. The patient's functional ability including ADL's, fall risks, home safety risks and hearing or visual ?            impairment. ?5. Diet and physical activities ?6. Evidence for depression or mood disorders ? ?The patients weight, height, BMI and visual acuity have been recorded in the chart ?I have made referrals, counseling and provided education to the patient based review of the above and I have provided the pt with a written personalized care plan for preventive services. ? ?I have provided you with a copy of your personalized plan for preventive services. Please take the time to review along with your updated medication list. ? ?Healthy ?No cancer screening due to age ?Flu vaccine in fall--and COVID if recommended ?Exercises regularly ?

## 2021-08-06 NOTE — Assessment & Plan Note (Signed)
No symptoms ?On metoprolol '100mg'$  daily, eliquis 2.5 bid ?

## 2021-08-06 NOTE — Assessment & Plan Note (Signed)
Stable in low 40's for many years ?No action ?

## 2021-08-06 NOTE — Progress Notes (Signed)
Hearing Screening - Comments:: Passed whisper test Vision Screening - Comments:: January 2023  

## 2021-08-06 NOTE — Assessment & Plan Note (Signed)
Continues on vitamin D ?

## 2021-08-06 NOTE — Assessment & Plan Note (Signed)
Pacer working fine ?

## 2021-08-09 ENCOUNTER — Encounter: Payer: PPO | Admitting: Internal Medicine

## 2021-08-12 ENCOUNTER — Other Ambulatory Visit: Payer: Self-pay | Admitting: *Deleted

## 2021-08-12 ENCOUNTER — Encounter: Payer: Self-pay | Admitting: Internal Medicine

## 2021-08-12 ENCOUNTER — Other Ambulatory Visit: Payer: Self-pay

## 2021-08-12 ENCOUNTER — Other Ambulatory Visit (HOSPITAL_COMMUNITY): Payer: Self-pay

## 2021-08-12 MED ORDER — APIXABAN 2.5 MG PO TABS
2.5000 mg | ORAL_TABLET | Freq: Two times a day (BID) | ORAL | 1 refills | Status: DC
  Filled 2021-08-12: qty 180, 90d supply, fill #0

## 2021-08-12 MED ORDER — APIXABAN 2.5 MG PO TABS
2.5000 mg | ORAL_TABLET | Freq: Two times a day (BID) | ORAL | 1 refills | Status: DC
  Filled 2021-08-12: qty 180, 90d supply, fill #0
  Filled 2021-11-10: qty 180, 90d supply, fill #1

## 2021-08-12 NOTE — Telephone Encounter (Signed)
Eliquis 2.5 mg refill request received. Patient is 87 years old, weight- 75.8 kg, Crea- 1.59 on 07/30/21, Diagnosis- PAF, and last seen by Dr. Caryl Comes on 07/10/21. Dose is appropriate based on dosing criteria. Will send in refill to requested pharmacy.   ?

## 2021-08-12 NOTE — Telephone Encounter (Signed)
Prescription refill request for Eliquis received. ?Indication:  Atrial Fib ?Last office visit: 07/30/21  Olin Pia MD ?Scr: 1.59 on 07/30/21 ?Age: 87 ?Weight: 76.2kg ? ?Based on above findings Eliquis 2.'5mg'$  twice daily is the appropriate dose.  Refill approved. ? ?

## 2021-08-12 NOTE — Telephone Encounter (Signed)
PLEASE REVIEW.

## 2021-10-01 ENCOUNTER — Ambulatory Visit (INDEPENDENT_AMBULATORY_CARE_PROVIDER_SITE_OTHER): Payer: PPO

## 2021-10-01 DIAGNOSIS — I495 Sick sinus syndrome: Secondary | ICD-10-CM

## 2021-10-04 LAB — CUP PACEART REMOTE DEVICE CHECK
Battery Remaining Percentage: 45 %
Brady Statistic RA Percent Paced: 99 %
Brady Statistic RV Percent Paced: 1 %
Date Time Interrogation Session: 20230530120421
Implantable Lead Implant Date: 20060801
Implantable Lead Implant Date: 20060801
Implantable Lead Location: 753859
Implantable Lead Location: 753860
Implantable Lead Model: 350
Implantable Lead Serial Number: 24319010
Implantable Lead Serial Number: 24332742
Implantable Pulse Generator Implant Date: 20141229
Lead Channel Impedance Value: 429 Ohm
Lead Channel Impedance Value: 683 Ohm
Lead Channel Pacing Threshold Amplitude: 0.3 V
Lead Channel Pacing Threshold Amplitude: 0.6 V
Lead Channel Pacing Threshold Pulse Width: 0.4 ms
Lead Channel Pacing Threshold Pulse Width: 0.4 ms
Lead Channel Sensing Intrinsic Amplitude: 2.2 mV
Lead Channel Sensing Intrinsic Amplitude: 9.1 mV
Lead Channel Setting Pacing Pulse Width: 0.4 ms
Pulse Gen Serial Number: 68145068

## 2021-10-17 NOTE — Progress Notes (Signed)
Remote pacemaker transmission.   

## 2021-11-11 ENCOUNTER — Other Ambulatory Visit (HOSPITAL_COMMUNITY): Payer: Self-pay

## 2021-12-31 ENCOUNTER — Ambulatory Visit (INDEPENDENT_AMBULATORY_CARE_PROVIDER_SITE_OTHER): Payer: PPO

## 2021-12-31 DIAGNOSIS — I495 Sick sinus syndrome: Secondary | ICD-10-CM | POA: Diagnosis not present

## 2021-12-31 LAB — CUP PACEART REMOTE DEVICE CHECK
Date Time Interrogation Session: 20230829083721
Implantable Lead Implant Date: 20060801
Implantable Lead Implant Date: 20060801
Implantable Lead Location: 753859
Implantable Lead Location: 753860
Implantable Lead Model: 350
Implantable Lead Serial Number: 24319010
Implantable Lead Serial Number: 24332742
Implantable Pulse Generator Implant Date: 20141229
Pulse Gen Serial Number: 68145068

## 2022-01-02 ENCOUNTER — Ambulatory Visit (INDEPENDENT_AMBULATORY_CARE_PROVIDER_SITE_OTHER): Payer: PPO | Admitting: Internal Medicine

## 2022-01-02 ENCOUNTER — Encounter: Payer: Self-pay | Admitting: Internal Medicine

## 2022-01-02 DIAGNOSIS — L570 Actinic keratosis: Secondary | ICD-10-CM

## 2022-01-02 NOTE — Assessment & Plan Note (Signed)
Discussed options and potential side effects of cryotherapy Not on vermillion border so reasonable to treat Verbal consent  Liquid nitrogen 30 seconds x 2 to the lesion (very little overflow) Tolerated well Discussed home care

## 2022-01-02 NOTE — Progress Notes (Signed)
Subjective:    Patient ID: Jason Lambert, male    DOB: [birth year], 87 y.o.   MRN: [id number]  HPI Here due to a lesion on his lip  Has spot on lower portion of right lower lip for 2-3 weeks Crusts--so he picks it off Then bleeds some Not really painful  Current Outpatient Medications on File Prior to Visit  Medication Sig Dispense Refill   apixaban (ELIQUIS) 2.5 MG TABS tablet Take 1 tablet by mouth 2 times daily. 180 tablet 1   atorvastatin (LIPITOR) 10 MG tablet Take 1 tablet by mouth daily. 90 tablet 3   diphenhydramine-acetaminophen (TYLENOL PM) 25-500 MG TABS tablet Take 1 tablet by mouth at bedtime as needed.     hydrochlorothiazide (HYDRODIURIL) 25 MG tablet Take 1 tablet by mouth daily. 90 tablet 3   loratadine (CLARITIN) 10 MG tablet Take 10 mg by mouth daily as needed.     metoprolol succinate (TOPROL-XL) 100 MG 24 hr tablet Take 1 tablet by mouth daily. Take with or immediately following a meal. 90 tablet 3   Multiple Vitamins-Minerals (CENTRUM SILVER PO) Take 1 tablet by mouth daily.     No current facility-administered medications on file prior to visit.    No Known Allergies  Past Medical History:  Diagnosis Date   Adenocarcinoma ([place])    Prostate   Allergic rhinitis    Atrial fibrillation ([place])    Grave's disease    Hyperlipidemia    Hypertension    Renal insufficiency    sinus node dysfunction    s/p BTK pacemaker    Past Surgical History:  Procedure Laterality Date   CATARACT EXTRACTION W/ INTRAOCULAR LENS  IMPLANT, BILATERAL  2012   PACEMAKER GENERATOR CHANGE N/A 05/02/2013   Procedure: PACEMAKER GENERATOR CHANGE;  Surgeon: Deboraha Sprang, MD;  Location: [facility] CATH LAB;  Service: Cardiovascular;  Laterality: N/A;   PACEMAKER INSERTION  8/06; 05/02/2013   Bradycardia/ syncope; BTK with generator change 05/02/2013 by Dr Caryl Comes   Prostate Cancer  2007   RT   TONSILLECTOMY  1947   adenoids    Family History  Problem Relation Age of Onset   Cancer  Mother    Diabetes Mother    Coronary artery disease Father        cabg   Hypertension Neg Hx    Prostate cancer Neg Hx    Colon cancer Neg Hx     Social History   Socioeconomic History   Marital status: Married    Spouse name: Not on file   Number of children: 2   Years of education: Not on file   Highest education level: Not on file  Occupational History   Occupation: Retired    Comment: Art gallery manager with [employer]  Tobacco Use   Smoking status: Never    Passive exposure: Never   Smokeless tobacco: Never  Vaping Use   Vaping Use: Never used  Substance and Sexual Activity   Alcohol use: No    Comment: Occasional   Drug use: No   Sexual activity: Not on file  Other Topics Concern   Not on file  Social History Narrative   Has a living will.    Wife to make health care decisions (then daughter [name])--formal health care POA   Would accept resuscitation   Not sure about feeding tube but no "unusual attempts"   Donating body to [place] Determinants of Health   Financial Resource Strain:  Not on file  Food Insecurity: Not on file  Transportation Needs: Not on file  Physical Activity: Not on file  Stress: Not on file  Social Connections: Not on file  Intimate Partner Violence: Not on file   Review of Systems No history of cold sores No trauma     Objective:   Physical Exam Constitutional:      Appearance: Normal appearance.  HENT:     Mouth/Throat:     Comments: 2-63m crusted, mildly inflamed papule 618mbelow the vermillion border of left lower lip Neurological:     Mental Status: He is alert.            Assessment & Plan:

## 2022-01-20 ENCOUNTER — Encounter: Payer: Self-pay | Admitting: Internal Medicine

## 2022-01-24 NOTE — Progress Notes (Signed)
Remote pacemaker transmission.   

## 2022-02-02 ENCOUNTER — Other Ambulatory Visit: Payer: Self-pay | Admitting: Internal Medicine

## 2022-02-02 DIAGNOSIS — I4891 Unspecified atrial fibrillation: Secondary | ICD-10-CM

## 2022-02-03 ENCOUNTER — Other Ambulatory Visit (HOSPITAL_COMMUNITY): Payer: Self-pay

## 2022-02-03 MED ORDER — APIXABAN 2.5 MG PO TABS
2.5000 mg | ORAL_TABLET | Freq: Two times a day (BID) | ORAL | 1 refills | Status: DC
  Filled 2022-02-03: qty 180, 90d supply, fill #0
  Filled 2022-05-27: qty 180, 90d supply, fill #1

## 2022-02-03 NOTE — Telephone Encounter (Signed)
Eliquis 2.'5mg'$  refill request received. Patient is 87 years old, weight-73.9kg, Crea-1.59 on 07/30/2021, Diagnosis-Afib, and last seen by Dr. Caryl Comes on 07/30/2021. Dose is appropriate based on dosing criteria. Will send in refill to requested pharmacy.

## 2022-02-28 ENCOUNTER — Other Ambulatory Visit (HOSPITAL_COMMUNITY): Payer: Self-pay

## 2022-03-19 ENCOUNTER — Encounter: Payer: Self-pay | Admitting: Internal Medicine

## 2022-04-01 ENCOUNTER — Ambulatory Visit (INDEPENDENT_AMBULATORY_CARE_PROVIDER_SITE_OTHER): Payer: PPO

## 2022-04-01 DIAGNOSIS — I495 Sick sinus syndrome: Secondary | ICD-10-CM | POA: Diagnosis not present

## 2022-04-01 LAB — CUP PACEART REMOTE DEVICE CHECK
Date Time Interrogation Session: 20231128080135
Implantable Lead Connection Status: 753985
Implantable Lead Connection Status: 753985
Implantable Lead Implant Date: 20060801
Implantable Lead Implant Date: 20060801
Implantable Lead Location: 753859
Implantable Lead Location: 753860
Implantable Lead Model: 350
Implantable Lead Serial Number: 24319010
Implantable Lead Serial Number: 24332742
Implantable Pulse Generator Implant Date: 20141229
Pulse Gen Serial Number: 68145068

## 2022-05-01 NOTE — Progress Notes (Signed)
Remote pacemaker transmission.   

## 2022-05-14 DIAGNOSIS — Z961 Presence of intraocular lens: Secondary | ICD-10-CM | POA: Diagnosis not present

## 2022-05-14 DIAGNOSIS — H16223 Keratoconjunctivitis sicca, not specified as Sjogren's, bilateral: Secondary | ICD-10-CM | POA: Diagnosis not present

## 2022-05-14 DIAGNOSIS — H26492 Other secondary cataract, left eye: Secondary | ICD-10-CM | POA: Diagnosis not present

## 2022-05-16 ENCOUNTER — Other Ambulatory Visit (HOSPITAL_COMMUNITY): Payer: Self-pay

## 2022-05-19 DIAGNOSIS — Z9889 Other specified postprocedural states: Secondary | ICD-10-CM | POA: Diagnosis not present

## 2022-05-27 ENCOUNTER — Other Ambulatory Visit: Payer: Self-pay

## 2022-07-01 ENCOUNTER — Ambulatory Visit: Payer: PPO

## 2022-07-01 DIAGNOSIS — I495 Sick sinus syndrome: Secondary | ICD-10-CM | POA: Diagnosis not present

## 2022-07-03 LAB — CUP PACEART REMOTE DEVICE CHECK
Battery Voltage: 45
Date Time Interrogation Session: 20240229073817
Implantable Lead Connection Status: 753985
Implantable Lead Connection Status: 753985
Implantable Lead Implant Date: 20060801
Implantable Lead Implant Date: 20060801
Implantable Lead Location: 753859
Implantable Lead Location: 753860
Implantable Lead Model: 350
Implantable Lead Serial Number: 24319010
Implantable Lead Serial Number: 24332742
Implantable Pulse Generator Implant Date: 20141229
Pulse Gen Serial Number: 68145068

## 2022-08-06 NOTE — Progress Notes (Signed)
Remote pacemaker transmission.   

## 2022-08-12 ENCOUNTER — Ambulatory Visit (INDEPENDENT_AMBULATORY_CARE_PROVIDER_SITE_OTHER): Payer: PPO | Admitting: Internal Medicine

## 2022-08-12 ENCOUNTER — Encounter: Payer: Self-pay | Admitting: Internal Medicine

## 2022-08-12 ENCOUNTER — Other Ambulatory Visit: Payer: Self-pay

## 2022-08-12 VITALS — BP 124/72 | HR 74 | Temp 97.9°F | Ht 64.5 in | Wt 161.0 lb

## 2022-08-12 DIAGNOSIS — N1832 Chronic kidney disease, stage 3b: Secondary | ICD-10-CM

## 2022-08-12 DIAGNOSIS — Z0000 Encounter for general adult medical examination without abnormal findings: Secondary | ICD-10-CM

## 2022-08-12 DIAGNOSIS — I480 Paroxysmal atrial fibrillation: Secondary | ICD-10-CM | POA: Diagnosis not present

## 2022-08-12 DIAGNOSIS — N2581 Secondary hyperparathyroidism of renal origin: Secondary | ICD-10-CM | POA: Diagnosis not present

## 2022-08-12 DIAGNOSIS — I10 Essential (primary) hypertension: Secondary | ICD-10-CM | POA: Diagnosis not present

## 2022-08-12 DIAGNOSIS — N401 Enlarged prostate with lower urinary tract symptoms: Secondary | ICD-10-CM

## 2022-08-12 DIAGNOSIS — N138 Other obstructive and reflux uropathy: Secondary | ICD-10-CM | POA: Diagnosis not present

## 2022-08-12 DIAGNOSIS — I495 Sick sinus syndrome: Secondary | ICD-10-CM

## 2022-08-12 LAB — LIPID PANEL
Cholesterol: 142 mg/dL (ref 0–200)
HDL: 36.4 mg/dL — ABNORMAL LOW (ref >39.00)
LDL Cholesterol: 69 mg/dL (ref 0–99)
NonHDL: 105.94
Total CHOL/HDL Ratio: 4
Triglycerides: 187 mg/dL — ABNORMAL HIGH (ref 0.0–149.0)
VLDL: 37.4 mg/dL (ref 0.0–40.0)

## 2022-08-12 LAB — CBC
HCT: 33.2 % — ABNORMAL LOW (ref 39.0–52.0)
Hemoglobin: 11.7 g/dL — ABNORMAL LOW (ref 13.0–17.0)
MCHC: 35.1 g/dL (ref 30.0–36.0)
MCV: 99.4 fl (ref 78.0–100.0)
Platelets: 242 10*3/uL (ref 150.0–400.0)
RBC: 3.35 Mil/uL — ABNORMAL LOW (ref 4.22–5.81)
RDW: 13.1 % (ref 11.5–15.5)
WBC: 5.3 10*3/uL (ref 4.0–10.5)

## 2022-08-12 LAB — RENAL FUNCTION PANEL
Albumin: 4.4 g/dL (ref 3.5–5.2)
BUN: 23 mg/dL (ref 6–23)
CO2: 28 mEq/L (ref 19–32)
Calcium: 9.5 mg/dL (ref 8.4–10.5)
Chloride: 94 mEq/L — ABNORMAL LOW (ref 96–112)
Creatinine, Ser: 1.65 mg/dL — ABNORMAL HIGH (ref 0.40–1.50)
GFR: 37.57 mL/min — ABNORMAL LOW (ref >60.00)
Glucose, Bld: 115 mg/dL — ABNORMAL HIGH (ref 70–99)
Phosphorus: 3.3 mg/dL (ref 2.3–4.6)
Potassium: 3.6 mEq/L (ref 3.5–5.1)
Sodium: 133 mEq/L — ABNORMAL LOW (ref 135–145)

## 2022-08-12 LAB — TSH: TSH: 2.98 u[IU]/mL (ref 0.35–5.50)

## 2022-08-12 LAB — HEPATIC FUNCTION PANEL
ALT: 20 U/L (ref 0–53)
AST: 18 U/L (ref 0–37)
Albumin: 4.4 g/dL (ref 3.5–5.2)
Alkaline Phosphatase: 89 U/L (ref 39–117)
Bilirubin, Direct: 0.1 mg/dL (ref 0.0–0.3)
Total Bilirubin: 0.7 mg/dL (ref 0.2–1.2)
Total Protein: 7.3 g/dL (ref 6.0–8.3)

## 2022-08-12 LAB — VITAMIN D 25 HYDROXY (VIT D DEFICIENCY, FRACTURES): VITD: 51.56 ng/mL (ref 30.00–100.00)

## 2022-08-12 MED ORDER — METOPROLOL SUCCINATE ER 100 MG PO TB24
100.0000 mg | ORAL_TABLET | Freq: Every day | ORAL | 3 refills | Status: DC
Start: 2022-08-12 — End: 2023-06-03
  Filled 2022-08-12: qty 90, 90d supply, fill #0
  Filled 2022-11-18: qty 90, 90d supply, fill #1
  Filled 2023-02-12: qty 90, 90d supply, fill #2
  Filled 2023-05-13: qty 90, 90d supply, fill #3

## 2022-08-12 MED ORDER — HYDROCHLOROTHIAZIDE 25 MG PO TABS
25.0000 mg | ORAL_TABLET | Freq: Every day | ORAL | 3 refills | Status: DC
  Filled 2022-08-12: qty 90, 90d supply, fill #0
  Filled 2022-11-18: qty 90, 90d supply, fill #1
  Filled 2023-02-12: qty 90, 90d supply, fill #2
  Filled 2023-05-13: qty 90, 90d supply, fill #3

## 2022-08-12 MED ORDER — ATORVASTATIN CALCIUM 10 MG PO TABS
10.0000 mg | ORAL_TABLET | Freq: Every day | ORAL | 3 refills | Status: DC
Start: 2022-08-12 — End: 2023-06-03
  Filled 2022-08-12: qty 90, 90d supply, fill #0
  Filled 2022-11-18: qty 90, 90d supply, fill #1
  Filled 2023-02-12: qty 90, 90d supply, fill #2
  Filled 2023-05-13: qty 90, 90d supply, fill #3

## 2022-08-12 NOTE — Assessment & Plan Note (Signed)
BP Readings from Last 3 Encounters:  08/12/22 124/72  01/02/22 126/70  08/06/21 138/74   Controlled on the metoprolol and HCTZ 25

## 2022-08-12 NOTE — Assessment & Plan Note (Signed)
Pacemaker working fine  

## 2022-08-12 NOTE — Progress Notes (Signed)
Hearing Screening - Comments:: Passed whisper test Vision Screening - Comments:: January 2024  

## 2022-08-12 NOTE — Assessment & Plan Note (Signed)
Paced rhythm No symptoms of recurrence on metoprolol 100 daily Eliquis 2.5 mg bid

## 2022-08-12 NOTE — Assessment & Plan Note (Signed)
Elevated PTH in the past Has vitamin D in his multivit

## 2022-08-12 NOTE — Assessment & Plan Note (Signed)
Symptoms not bad enough for meds as yet Consider trial of tamsulosin if worsens

## 2022-08-12 NOTE — Assessment & Plan Note (Signed)
Has been stable in lower 40's for many years---so no action

## 2022-08-12 NOTE — Progress Notes (Signed)
Subjective:    Patient ID: Jason Lambert, male    DOB: 1934-07-09, 88 y.o.   MRN: [id number]  HPI Here with wife for Medicare wellness visit and follow up of chronic health conditions Reviewed advanced directives Reviewed other doctors---Dr Klein--[facility], Dr Lissa Hoard, [facility] No hospitalizations or surgery in the past year Some change in left eye vision--has follow up planned Hearing is good No alcohol or tobacco Exercises regularly with NuStep No falls No depression or anhedonia Independent with instrumental ADLs No sig memory issues  Doing well No new concerns  Does have pain when he gets up---works out fairly quickly  No palpitations  No chest pain or SOB No syncope. No sig dizziness--unless he jumps up too fast No edema No change in exercise tolerance Pacemaker is checked quarterly  GFR stable for some time at 43 last year Was on vitamin D in the past---elevated PTH--but is in vitamin D  Current Outpatient Medications on File Prior to Visit  Medication Sig Dispense Refill   apixaban (ELIQUIS) 2.5 MG TABS tablet Take 1 tablet by mouth 2 times daily. 180 tablet 1   atorvastatin (LIPITOR) 10 MG tablet Take 1 tablet by mouth daily. 90 tablet 3   diphenhydramine-acetaminophen (TYLENOL PM) 25-500 MG TABS tablet Take 1 tablet by mouth at bedtime as needed.     hydrochlorothiazide (HYDRODIURIL) 25 MG tablet Take 1 tablet by mouth daily. 90 tablet 3   loratadine (CLARITIN) 10 MG tablet Take 10 mg by mouth daily as needed.     metoprolol succinate (TOPROL-XL) 100 MG 24 hr tablet Take 1 tablet by mouth daily. Take with or immediately following a meal. 90 tablet 3   Multiple Vitamins-Minerals (CENTRUM SILVER PO) Take 1 tablet by mouth daily.     No current facility-administered medications on file prior to visit.    No Known Allergies  Past Medical History:  Diagnosis Date   Adenocarcinoma    Prostate   Allergic rhinitis    Atrial  fibrillation    Grave's disease    Hyperlipidemia    Hypertension    Renal insufficiency    sinus node dysfunction    s/p BTK pacemaker    Past Surgical History:  Procedure Laterality Date   CATARACT EXTRACTION W/ INTRAOCULAR LENS  IMPLANT, BILATERAL  2012   PACEMAKER GENERATOR CHANGE N/A 05/02/2013   Procedure: PACEMAKER GENERATOR CHANGE;  Surgeon: Duke Salvia, MD;  Location: [facility] CATH LAB;  Service: Cardiovascular;  Laterality: N/A;   PACEMAKER INSERTION  8/06; 05/02/2013   Bradycardia/ syncope; BTK with generator change 05/02/2013 by Dr Graciela Husbands   Prostate Cancer  2007   RT   TONSILLECTOMY  1947   adenoids    Family History  Problem Relation Age of Onset   Cancer Mother    Diabetes Mother    Coronary artery disease Father        cabg   Hypertension Neg Hx    Prostate cancer Neg Hx    Colon cancer Neg Hx     Social History   Socioeconomic History   Marital status: Married    Spouse name: Not on file   Number of children: 2   Years of education: Not on file   Highest education level: Not on file  Occupational History   Occupation: Retired    Comment: Acupuncturist with [employer]  Tobacco Use   Smoking status: Never    Passive exposure: Never   Smokeless tobacco: Never  Vaping Use  Vaping Use: Never used  Substance and Sexual Activity   Alcohol use: No    Comment: Occasional   Drug use: No   Sexual activity: Not on file  Other Topics Concern   Not on file  Social History Narrative   Has a living will.    Wife to make health care decisions (then daughter [name])--formal health care POA   Would accept resuscitation   Not sure about feeding tube but no "unusual attempts"   Donating body to [employer]      Social Determinants of Health   Financial Resource Strain: Not on file  Food Insecurity: Not on file  Transportation Needs: Not on file  Physical Activity: Not on file  Stress: Not on file  Social Connections: Not on file  Intimate Partner  Violence: Not on file   Review of Systems Appetite is good Weight is stable Sleeps well--uses the tylenol PM daily. Discussed trying regular tylenol instead Wears seat belt Teeth okay--keeps up with dentist No suspicious skin lesions--no derm No heartburn or dysphagia Bowels okay--slow at times. No blood Voids okay---stream is slow. Feels he empties okay No sig back or joint pains    Objective:   Physical Exam Constitutional:      Appearance: Normal appearance.  HENT:     Mouth/Throat:     Pharynx: No oropharyngeal exudate or posterior oropharyngeal erythema.  Eyes:     Conjunctiva/sclera: Conjunctivae normal.     Pupils: Pupils are equal, round, and reactive to light.  Cardiovascular:     Rate and Rhythm: Normal rate and regular rhythm.     Pulses: Normal pulses.     Heart sounds:     No gallop.     Comments: ? Faint systolic murmur upper LSB Pulmonary:     Effort: Pulmonary effort is normal.     Breath sounds: Normal breath sounds. No wheezing or rales.  Abdominal:     Palpations: Abdomen is soft.     Tenderness: There is no abdominal tenderness.  Musculoskeletal:     Cervical back: Neck supple.     Right lower leg: No edema.     Left lower leg: No edema.  Lymphadenopathy:     Cervical: No cervical adenopathy.  Skin:    Findings: No lesion or rash.  Neurological:     General: No focal deficit present.     Mental Status: He is alert and oriented to person, place, and time.     Comments: Word naming 7/1 minute Recall 3/3  Psychiatric:        Mood and Affect: Mood normal.        Behavior: Behavior normal.            Assessment & Plan:

## 2022-08-12 NOTE — Assessment & Plan Note (Signed)
I have personally reviewed the Medicare Annual Wellness questionnaire and have noted 1. The patient's medical and social history 2. Their use of alcohol, tobacco or illicit drugs 3. Their current medications and supplements 4. The patient's functional ability including ADL's, fall risks, home safety risks and hearing or visual             impairment. 5. Diet and physical activities 6. Evidence for depression or mood disorders  The patients weight, height, BMI and visual acuity have been recorded in the chart I have made referrals, counseling and provided education to the patient based review of the above and I have provided the pt with a written personalized care plan for preventive services.  I have provided you with a copy of your personalized plan for preventive services. Please take the time to review along with your updated medication list.  Done with cancer screening Discussed adding resistance exercise Updated COVID today at [facility] Flu and RSV for the fall Consider prevnar 20

## 2022-08-13 LAB — PARATHYROID HORMONE, INTACT (NO CA): PTH: 124 pg/mL — ABNORMAL HIGH (ref 16–77)

## 2022-08-19 ENCOUNTER — Ambulatory Visit: Payer: PPO | Attending: Internal Medicine | Admitting: Internal Medicine

## 2022-08-19 ENCOUNTER — Encounter: Payer: Self-pay | Admitting: Internal Medicine

## 2022-08-19 ENCOUNTER — Other Ambulatory Visit (HOSPITAL_COMMUNITY): Payer: Self-pay

## 2022-08-19 VITALS — BP 120/80 | HR 65 | Ht 64.0 in | Wt 164.6 lb

## 2022-08-19 DIAGNOSIS — I495 Sick sinus syndrome: Secondary | ICD-10-CM

## 2022-08-19 DIAGNOSIS — Z950 Presence of cardiac pacemaker: Secondary | ICD-10-CM

## 2022-08-19 DIAGNOSIS — I4891 Unspecified atrial fibrillation: Secondary | ICD-10-CM

## 2022-08-19 DIAGNOSIS — I480 Paroxysmal atrial fibrillation: Secondary | ICD-10-CM

## 2022-08-19 DIAGNOSIS — R55 Syncope and collapse: Secondary | ICD-10-CM | POA: Diagnosis not present

## 2022-08-19 MED ORDER — APIXABAN 2.5 MG PO TABS
2.5000 mg | ORAL_TABLET | Freq: Two times a day (BID) | ORAL | 1 refills | Status: DC
Start: 2022-08-19 — End: 2023-02-18
  Filled 2022-08-19: qty 180, 90d supply, fill #0
  Filled 2022-11-18 – 2022-11-25 (×2): qty 180, 90d supply, fill #1

## 2022-08-19 NOTE — Progress Notes (Signed)
Patient Care Team: Karie Schwalbe, MD as PCP - General Lenice Llamas Garry Heater, [facility] as Pharmacist (Pharmacist)   HPI  Jason Lambert is a 88 y.o. male Seen in followup for pacemaker Biotronik implanted for sinus node dysfunction and syncope. Generator replacement 12/14.  Found to have atrial fibrillation on his device recordings.  Anticoagulation w apixoban without bleeding    The patient denies chest pain, shortness of breath, nocturnal dyspnea, orthopnea or peripheral edema.  There have been no palpitations or syncope.  Complains of episodes of lightheadedness which have been going on for about 10-15 years typically provoked by moderate exertion accompanied by a sense of warmth.  Aborted by lying down, sitting is often insufficient and symptoms will persist for up to 15-30 minutes.  No change in the frequency of these events or the triggering threshold  [name]  His complaint is of exercise intolerance.  Notably different over the last 2 years.  No edema nocturnal dyspnea orthopnea or chest pain.  No palpitations    Date Cr K Hgb  12/16 1.46 4.6 12.9  1/18 1.63 4.9 12.3  7/20 1.77 4.0 12.6(2/20)  3/21 1.64 4.2 12.4  3/22 1.65 3.5 12.2  4/24 1.65 3.6 11.7      Past Medical History:  Diagnosis Date   Adenocarcinoma    Prostate   Allergic rhinitis    Atrial fibrillation    Grave's disease    Hyperlipidemia    Hypertension    Renal insufficiency    sinus node dysfunction    s/p BTK pacemaker    Past Surgical History:  Procedure Laterality Date   CATARACT EXTRACTION W/ INTRAOCULAR LENS  IMPLANT, BILATERAL  2012   PACEMAKER GENERATOR CHANGE N/A 05/02/2013   Procedure: PACEMAKER GENERATOR CHANGE;  Surgeon: Duke Salvia, MD;  Location: [facility] CATH LAB;  Service: Cardiovascular;  Laterality: N/A;   PACEMAKER INSERTION  8/06; 05/02/2013   Bradycardia/ syncope; BTK with generator change 05/02/2013 by Dr Graciela Husbands   Prostate Cancer  2007    RT   TONSILLECTOMY  1947   adenoids    Current Outpatient Medications  Medication Sig Dispense Refill   acetaminophen (TYLENOL) 325 MG tablet Take 650 mg by mouth at bedtime.     apixaban (ELIQUIS) 2.5 MG TABS tablet Take 1 tablet by mouth 2 times daily. 180 tablet 1   atorvastatin (LIPITOR) 10 MG tablet Take 1 tablet (10 mg total) by mouth daily. 90 tablet 3   cholecalciferol (VITAMIN D3) 25 MCG (1000 UNIT) tablet Take 1,000 Units by mouth daily. Per patient taking 800 units daily     hydrochlorothiazide (HYDRODIURIL) 25 MG tablet Take 1 tablet (25 mg total) by mouth daily. 90 tablet 3   loratadine (CLARITIN) 10 MG tablet Take 10 mg by mouth daily as needed.     metoprolol succinate (TOPROL-XL) 100 MG 24 hr tablet Take 1 tablet (100 mg total) by mouth daily. Take with or immediately following a meal. 90 tablet 3   Multiple Vitamins-Minerals (CENTRUM SILVER PO) Take 1 tablet by mouth daily.     No current facility-administered medications for this visit.    No Known Allergies  Review of Systems negative except from HPI and PMH  Physical Exam BP 120/80   Pulse 65   Ht  (1.626 m)   Wt 164 lb 9.6 oz (74.7 kg)   SpO2 95%   BMI 28.25 kg/m  Well developed and well nourished in  no acute distress HENT normal Neck supple with JVP-flat Clear Device pocket well healed; without hematoma or erythema.  There is no tethering  Regular rate and rhythm, no  gallop No murmur Abd-soft with active BS No Clubbing cyanosis  edema Skin-warm and dry A & Oriented  Grossly normal sensory and motor function  ECG atrial pacing at 65 28/14/45  Device function is normal. Programming changes made to the CLS response was low--medium in the upper sensor rate from 120--130 See Paceart for details      Assessment and  Plan  Sinus node dysfunction ( 99% Ap)  Pacemaker Biotoronik.  .    \ Atrial fibrillation-paroxysmal  Neurocardiogenic syncope  Hypertension  Volume overload  Renal  insufficiency  Gd 3 (GFR 37)   Dyspnea on exertion has a multitude of potential Causes.  Versus chronotropic incompetence, we did a couple of stairwell walks was unable to generate heart rate faster than 90, we have made the programming changes above we will see how it is that he does with this.  It has been years also, we will undertake an echocardiogram to look at left ventricular function.  At this juncture he is euvolemic.  Blood pressure is well-controlled we will continue him on Toprol Lipitor HydroDIURIL and Eliquis at 2.5 mg as there is been no significant bleeding, doses for age and renal function  We will plan to see him again in about 4 weeks to reassess heart rate excursion on the back stais reassess left ventricular function and then consider pharmacotherapy to help with his dyspnea on exertion

## 2022-08-19 NOTE — Patient Instructions (Signed)
Medication Instructions:  None ordered.  *If you need a refill on your cardiac medications before your next appointment, please call your pharmacy*   Lab Work: None ordered.  If you have labs (blood work) drawn today and your tests are completely normal, you will receive your results only by: MyChart Message (if you have MyChart) OR A paper copy in the mail If you have any lab test that is abnormal or we need to change your treatment, we will call you to review the results.   Testing/Procedures: Your physician has requested that you have an echocardiogram. Echocardiography is a painless test that uses sound waves to create images of your heart. It provides your doctor with information about the size and shape of your heart and how well your heart's chambers and valves are working. This procedure takes approximately one hour. There are no restrictions for this procedure. Please do NOT wear cologne, perfume, aftershave, or lotions (deodorant is allowed). Please arrive 15 minutes prior to your appointment time.     Follow-Up: At [facility], you and your health needs are our priority.  As part of our continuing mission to provide you with exceptional heart care, we have created designated Provider Care Teams.  These Care Teams include your primary Cardiologist (physician) and Advanced Practice Providers (APPs -  Physician Assistants and Nurse Practitioners) who all work together to provide you with the care you need, when you need it.  We recommend signing up for the patient portal called "MyChart".  Sign up information is provided on this After Visit Summary.  MyChart is used to connect with patients for Virtual Visits (Telemedicine).  Patients are able to view lab/test results, encounter notes, upcoming appointments, etc.  Non-urgent messages can be sent to your provider as well.   To learn more about what you can do with MyChart, go to [email or web address].    Your next  appointment:   Suzann Riddle,NP on Thursday, 09/11/2022 at 11am

## 2022-08-20 ENCOUNTER — Other Ambulatory Visit: Payer: Self-pay

## 2022-09-08 NOTE — Progress Notes (Unsigned)
Cardiology Office Note Date:  09/11/2022  Patient ID:  Jason Lambert [birth year], MRN [id number] PCP:  Karie Schwalbe, MD  Cardiologist:  None Electrophysiologist: Sherryl Manges, MD    Chief Complaint: fatigue  History of Present Illness: Jason Lambert is a 88 y.o. male with PMH notable for sinus node dysfunction s/p pacemaker, paroxysmal A-fib, HRN; seen today for Sherryl Manges, MD for routine electrophysiology followup.  He saw Dr. Graciela Husbands about 3 weeks ago, was complaining of worsening dyspnea on exertion during exercise.  Dr. Graciela Husbands adjusted CLS response low => medium and upper sensor rate 120 => 130. Updating echo.   Today, he does not feel any different since PPM adjustments. Continues to have fatigue with activities, most pronounced when he is leaning over and concentrating (like yesterday when he was working on model trains).  He exercises daily from 5:30p-about 6p. Does not feel fatigued while exercising. He does feel SOB with climbing stairs.   Does not check BP at home regularly  No bleeding concerns on eliquis.   Denies CP, chest pressure   Device Information: Biotronic dual chamber PPM, imp 12/2004; dx SSS Gen change 04/2013  Past Medical History:  Diagnosis Date   Adenocarcinoma (HCC)    Prostate   Allergic rhinitis    Atrial fibrillation (HCC)    Grave's disease    Hyperlipidemia    Hypertension    Renal insufficiency    sinus node dysfunction    s/p BTK pacemaker    Past Surgical History:  Procedure Laterality Date   CATARACT EXTRACTION W/ INTRAOCULAR LENS  IMPLANT, BILATERAL  2012   PACEMAKER GENERATOR CHANGE N/A 05/02/2013   Procedure: PACEMAKER GENERATOR CHANGE;  Surgeon: Duke Salvia, MD;  Location: [facility] CATH LAB;  Service: Cardiovascular;  Laterality: N/A;   PACEMAKER INSERTION  8/06; 05/02/2013   Bradycardia/ syncope; BTK with generator change 05/02/2013 by Dr Graciela Husbands   Prostate Cancer  2007   RT   TONSILLECTOMY  1947   adenoids     Current Outpatient Medications  Medication Instructions   acetaminophen (TYLENOL) 650 mg, Oral, Nightly   apixaban (ELIQUIS) 2.5 MG TABS tablet Take 1 tablet by mouth 2 times daily.   atorvastatin (LIPITOR) 10 mg, Oral, Daily   cholecalciferol (VITAMIN D3) 1,000 Units, Oral, Daily, Per patient taking 800 units daily   hydrochlorothiazide (HYDRODIURIL) 25 mg, Oral, Daily   loratadine (CLARITIN) 10 mg, Oral, Daily PRN   metoprolol succinate (TOPROL-XL) 100 MG 24 hr tablet Take 1 tablet (100 mg total) by mouth daily. Take with or immediately following a meal.   Multiple Vitamins-Minerals (CENTRUM SILVER PO) 1 tablet, Oral, Daily,      Social History:  The patient  reports that he has never smoked. He has never been exposed to tobacco smoke. He has never used smokeless tobacco. He reports that he does not drink alcohol and does not use drugs.   Family History:  The patient's family history includes Cancer in his mother; Coronary artery disease in his father; Diabetes in his mother.  ROS:  Please see the history of present illness. All other systems are reviewed and otherwise negative.   PHYSICAL EXAM:  VS:  BP 130/80 (BP Location: Left Arm, Patient Position: Sitting, Cuff Size: Normal)   Pulse 80   Ht 5\' 5"  (1.651 m)   Wt 164 lb (74.4 kg)   SpO2 96%   BMI 27.29 kg/m  BMI: Body mass index is 27.29 kg/m.  GEN- The patient is  well appearing, alert and oriented x 3 today.   Lungs- Clear to ausculation bilaterally, normal work of breathing.  Heart- Regular rate and rhythm, no murmurs, rubs or gallops Extremities- 1-2+ peripheral edema, warm, dry Skin-   device pocket well-healed, no tethering   Device interrogation done today and reviewed by myself:  Battery  Lead thresholds, impedence, sensing stable  No episodes Reviewed HR during exercise times - HR maintained at 60s without significant elevation CLS left at medium CLS resting increased from +30 > +40 No further adjustments  or changes    EKG is not ordered. Personal review of EKG from  08/18/2021  shows: A-paced, rate 65bpm; RBBB PR ~300   Recent Labs: 08/12/2022: ALT 20; BUN 23; Creatinine, Ser 1.65; Hemoglobin 11.7; Platelets 242.0; Potassium 3.6; Sodium 133; TSH 2.98  08/12/2022: Cholesterol 142; HDL 36.40; LDL Cholesterol 69; Total CHOL/HDL Ratio 4; Triglycerides 187.0; VLDL 37.4   CrCl cannot be calculated (Patient's most recent lab result is older than the maximum 21 days allowed.).   Wt Readings from Last 3 Encounters:  09/11/22 164 lb (74.4 kg)  08/19/22 164 lb 9.6 oz (74.7 kg)  08/12/22 161 lb (73 kg)     Additional studies reviewed include: Previous EP, cardiology notes.   TTE, 09/09/2022 1. Left ventricular ejection fraction, by estimation, is 60 to 65%. The left ventricle has normal function. The left ventricle has no regional wall motion abnormalities. Left ventricular diastolic parameters are consistent with Grade I diastolic dysfunction (impaired relaxation).   2. Right ventricular systolic function is normal. The right ventricular size is normal. There is normal pulmonary artery systolic pressure. The estimated right ventricular systolic pressure is 25.1 mmHg.   3. The mitral valve is normal in structure. No evidence of mitral valve regurgitation. No evidence of mitral stenosis.   4. The aortic valve is normal in structure. Aortic valve regurgitation is mild. No aortic stenosis is present.   5. There is borderline dilatation of the aortic root, measuring 37 mm. There is borderline dilatation of the ascending aorta, measuring 38 mm.   6. The inferior vena cava is normal in size with greater than 50% respiratory variability, suggesting right atrial pressure of 3 mmHg.      ASSESSMENT AND PLAN:  #) SND s/p PPM #) Fatigue #) DOE PPM functioning well, see paceart for details Slight adjustment to CLS resting to see if improves HR variability at rest Updated echo shows preserved LVEF, no  valvular concerns Recommended lexiscan to eval cors given age and elevated Cr   #) parox AFib Low burden by device CHA2DS2-VASc Score = 3 (HTN, age x 2)  NOAC - eliquis 2.5mg  BID, dose appropriately reduced      Current medicines are reviewed at length with the patient today.   The patient does not have concerns regarding his medicines.  The following changes were made today:  none  Labs/ tests ordered today include:  Orders Placed This Encounter  Procedures   NM Myocar Multi W/Spect W/Wall Motion / EF     Disposition: Follow up with EP APP in  6 weeks    Signed, Jason Don, NP  09/11/22  1:09 PM  Electrophysiology [facility]

## 2022-09-09 ENCOUNTER — Ambulatory Visit: Payer: PPO | Attending: Internal Medicine

## 2022-09-09 DIAGNOSIS — Z950 Presence of cardiac pacemaker: Secondary | ICD-10-CM | POA: Diagnosis not present

## 2022-09-09 DIAGNOSIS — I495 Sick sinus syndrome: Secondary | ICD-10-CM

## 2022-09-09 DIAGNOSIS — R55 Syncope and collapse: Secondary | ICD-10-CM | POA: Diagnosis not present

## 2022-09-09 DIAGNOSIS — I480 Paroxysmal atrial fibrillation: Secondary | ICD-10-CM

## 2022-09-09 DIAGNOSIS — I4891 Unspecified atrial fibrillation: Secondary | ICD-10-CM

## 2022-09-10 LAB — ECHOCARDIOGRAM COMPLETE
AR max vel: 1.49 cm2
AV Area VTI: 1.52 cm2
AV Area mean vel: 1.46 cm2
AV Mean grad: 5 mmHg
AV Peak grad: 9.4 mmHg
Ao pk vel: 1.53 m/s
Area-P 1/2: 1.68 cm2
Calc EF: 58.3 %
P 1/2 time: 571 msec
S' Lateral: 1.9 cm
Single Plane A2C EF: 53.9 %
Single Plane A4C EF: 64.2 %

## 2022-09-11 ENCOUNTER — Encounter: Payer: Self-pay | Admitting: Cardiology

## 2022-09-11 ENCOUNTER — Ambulatory Visit: Payer: PPO | Attending: Cardiology | Admitting: Cardiology

## 2022-09-11 VITALS — BP 130/80 | HR 80 | Ht 65.0 in | Wt 164.0 lb

## 2022-09-11 DIAGNOSIS — I480 Paroxysmal atrial fibrillation: Secondary | ICD-10-CM | POA: Diagnosis not present

## 2022-09-11 DIAGNOSIS — R0602 Shortness of breath: Secondary | ICD-10-CM

## 2022-09-11 DIAGNOSIS — Z950 Presence of cardiac pacemaker: Secondary | ICD-10-CM

## 2022-09-11 DIAGNOSIS — I495 Sick sinus syndrome: Secondary | ICD-10-CM

## 2022-09-11 DIAGNOSIS — R5383 Other fatigue: Secondary | ICD-10-CM

## 2022-09-11 LAB — CUP PACEART INCLINIC DEVICE CHECK
Date Time Interrogation Session: 20240509140701
Implantable Lead Connection Status: 753985
Implantable Lead Connection Status: 753985
Implantable Lead Implant Date: 20060801
Implantable Lead Implant Date: 20060801
Implantable Lead Location: 753859
Implantable Lead Location: 753860
Implantable Lead Model: 350
Implantable Lead Serial Number: 24319010
Implantable Lead Serial Number: 24332742
Implantable Pulse Generator Implant Date: 20141229
Pulse Gen Serial Number: 68145068

## 2022-09-11 NOTE — Patient Instructions (Signed)
Medication Instructions:  Your physician recommends that you continue on your current medications as directed. Please refer to the Current Medication list given to you today.  *If you need a refill on your cardiac medications before your next appointment, please call your pharmacy*   Lab Work: No labs ordered  If you have labs (blood work) drawn today and your tests are completely normal, you will receive your results only by: MyChart Message (if you have MyChart) OR A paper copy in the mail If you have any lab test that is abnormal or we need to change your treatment, we will call you to review the results.   Testing/Procedures: [facility] MYOVIEW  Your Provider has ordered a Stress Test with nuclear imaging. The purpose of this test is to evaluate the blood supply to your heart muscle. This procedure is referred to as a "Non-Invasive Stress Test." This is because other than having an IV started in your vein, nothing is inserted or "invades" your body. Cardiac stress tests are done to find areas of poor blood flow to the heart by determining the extent of coronary artery disease (CAD). Some patients exercise on a treadmill, which naturally increases the blood flow to your heart, while others who are unable to walk on a treadmill due to physical limitations have a pharmacologic/chemical stress agent called Lexiscan. This medicine will mimic walking on a treadmill by temporarily increasing your coronary blood flow.     REPORT TO [facility] MEDICAL MALL ENTRANCE  **Proceed to the 1st desk on the right, REGISTRATION, to check in**  Please note: this test may take anywhere between 2-4 hours to complete   Instructions regarding medication:   _X__:   You may take all of your regular morning medications the day of your test unless listed below: Do not take hydrochlorothiazide until after procedure.    How to prepare for your Myoview test:  Do not eat or drink for 6 hours prior to the test No  caffeine for 24 hours prior to the test No smoking 24 hours prior to the test. Ladies, please do not wear dresses.  Skirts or pants are appropriate. Please wear a short sleeve shirt. No perfume, cologne or lotion. Wear comfortable walking shoes. No heels!   PLEASE NOTIFY THE OFFICE AT LEAST 24 HOURS IN ADVANCE IF YOU ARE UNABLE TO KEEP YOUR APPOINTMENT.  [phone] AND  PLEASE NOTIFY NUCLEAR MEDICINE AT [facility] AT LEAST 24 HOURS IN ADVANCE IF YOU ARE UNABLE TO KEEP YOUR APPOINTMENT. [phone]   Follow-Up: At [facility], you and your health needs are our priority.  As part of our continuing mission to provide you with exceptional heart care, we have created designated Provider Care Teams.  These Care Teams include your primary Cardiologist (physician) and Advanced Practice Providers (APPs -  Physician Assistants and Nurse Practitioners) who all work together to provide you with the care you need, when you need it.  We recommend signing up for the patient portal called "MyChart".  Sign up information is provided on this After Visit Summary.  MyChart is used to connect with patients for Virtual Visits (Telemedicine).  Patients are able to view lab/test results, encounter notes, upcoming appointments, etc.  Non-urgent messages can be sent to your provider as well.   To learn more about what you can do with MyChart, go to [email or web address].    Your next appointment:   6 week(s)  Provider:   Sherie Don, NP   Other Instructions Limit sodium intake.  Monitor blood pressure daily for two weeks and send readings through My Chart for review.

## 2022-09-18 ENCOUNTER — Encounter
Admission: RE | Admit: 2022-09-18 | Discharge: 2022-09-18 | Disposition: A | Discharge status: Home / Self Care | Payer: PPO | Source: Ambulatory Visit | Attending: Cardiology | Admitting: Cardiology

## 2022-09-18 DIAGNOSIS — R5383 Other fatigue: Secondary | ICD-10-CM | POA: Insufficient documentation

## 2022-09-18 DIAGNOSIS — R0602 Shortness of breath: Secondary | ICD-10-CM | POA: Insufficient documentation

## 2022-09-18 LAB — NM MYOCAR MULTI W/SPECT W/WALL MOTION / EF
LV dias vol: 22 mL (ref 62–150)
Percent HR: 55 %
Rest HR: 68 {beats}/min
SDS: 1

## 2022-09-18 MED ORDER — TECHNETIUM TC 99M TETROFOSMIN IV KIT
28.9000 | PACK | Freq: Once | INTRAVENOUS | Status: AC | PRN
  Administered 2022-09-18: 28.9 via INTRAVENOUS

## 2022-09-18 MED ORDER — REGADENOSON 0.4 MG/5ML IV SOLN
0.4000 mg | Freq: Once | INTRAVENOUS | Status: AC
  Administered 2022-09-18: 0.4 mg via INTRAVENOUS

## 2022-09-18 MED ORDER — TECHNETIUM TC 99M TETROFOSMIN IV KIT
10.0000 | PACK | Freq: Once | INTRAVENOUS | Status: AC | PRN
  Administered 2022-09-18: 10.24 via INTRAVENOUS

## 2022-09-19 LAB — NM MYOCAR MULTI W/SPECT W/WALL MOTION / EF
LV sys vol: 6 mL
Nuc Stress EF: 73 %
Peak HR: 75 {beats}/min
Rest Nuclear Isotope Dose: 10.2 mCi
SRS: 0
SSS: 0
ST Depression (mm): 0 mm
Stress Nuclear Isotope Dose: 28.9 mCi
TID: 1.42

## 2022-09-30 ENCOUNTER — Ambulatory Visit (INDEPENDENT_AMBULATORY_CARE_PROVIDER_SITE_OTHER): Payer: PPO

## 2022-09-30 DIAGNOSIS — I495 Sick sinus syndrome: Secondary | ICD-10-CM | POA: Diagnosis not present

## 2022-10-01 LAB — CUP PACEART REMOTE DEVICE CHECK
Battery Voltage: 40
Date Time Interrogation Session: 20240528091510
Implantable Lead Connection Status: 753985
Implantable Lead Connection Status: 753985
Implantable Lead Implant Date: 20060801
Implantable Lead Implant Date: 20060801
Implantable Lead Location: 753859
Implantable Lead Location: 753860
Implantable Lead Model: 350
Implantable Lead Serial Number: 24319010
Implantable Lead Serial Number: 24332742
Implantable Pulse Generator Implant Date: 20141229
Pulse Gen Serial Number: 68145068

## 2022-10-24 NOTE — Progress Notes (Signed)
Remote pacemaker transmission.   

## 2022-10-27 NOTE — Progress Notes (Unsigned)
Cardiology Office Note Date:  10/27/2022  Patient ID:  Kentaro, Alewine April 01, 1935, MRN [id number] PCP:  Karie Schwalbe, MD  Cardiologist:  None Electrophysiologist: Sherryl Manges, MD    Chief Complaint: fatigue  History of Present Illness: STEFANOS HAYNESWORTH is a 88 y.o. male with PMH notable for sinus node dysfunction s/p pacemaker, paroxysmal A-fib, HRN; seen today for Sherryl Manges, MD for routine electrophysiology followup.  He saw Dr. Graciela Husbands in mid April 2024 with complaints of worsening dyspnea on exertion during exercise.  Dr. Graciela Husbands adjusted CLS response low => medium and upper sensor rate 120 => 130.  I saw him in follow-up about 6 weeks ago and he was continuing to have same symptoms.  Histograms by device showed heart rate was maintaining at 60 without significant elevation during times of exercise.  CLS further increased from +30  +40  *** any improvement in symptoms  *** AF burden, symptoms *** palpitations *** bleeding concerns   He denies chest pain, palpitations, dyspnea, PND, orthopnea, nausea, vomiting, dizziness, syncope, edema, weight gain, or early satiety.       Device Information: Biotronic dual chamber PPM, imp 12/2004; dx SSS Gen change 04/2013  Past Medical History:  Diagnosis Date   Adenocarcinoma (HCC)    Prostate   Allergic rhinitis    Atrial fibrillation (HCC)    Grave's disease    Hyperlipidemia    Hypertension    Renal insufficiency    sinus node dysfunction    s/p BTK pacemaker    Past Surgical History:  Procedure Laterality Date   CATARACT EXTRACTION W/ INTRAOCULAR LENS  IMPLANT, BILATERAL  2012   PACEMAKER GENERATOR CHANGE N/A 05/02/2013   Procedure: PACEMAKER GENERATOR CHANGE;  Surgeon: Duke Salvia, MD;  Location: [facility] CATH LAB;  Service: Cardiovascular;  Laterality: N/A;   PACEMAKER INSERTION  8/06; 05/02/2013   Bradycardia/ syncope; BTK with generator change 05/02/2013 by Dr Graciela Husbands   Prostate Cancer  2007   RT    TONSILLECTOMY  1947   adenoids    Current Outpatient Medications  Medication Instructions   acetaminophen (TYLENOL) 650 mg, Oral, Nightly   apixaban (ELIQUIS) 2.5 MG TABS tablet Take 1 tablet by mouth 2 times daily.   atorvastatin (LIPITOR) 10 mg, Oral, Daily   cholecalciferol (VITAMIN D3) 1,000 Units, Oral, Daily, Per patient taking 800 units daily   hydrochlorothiazide (HYDRODIURIL) 25 mg, Oral, Daily   loratadine (CLARITIN) 10 mg, Oral, Daily PRN   metoprolol succinate (TOPROL-XL) 100 MG 24 hr tablet Take 1 tablet (100 mg total) by mouth daily. Take with or immediately following a meal.   Multiple Vitamins-Minerals (CENTRUM SILVER PO) 1 tablet, Oral, Daily,      Social History:  The patient  reports that he has never smoked. He has never been exposed to tobacco smoke. He has never used smokeless tobacco. He reports that he does not drink alcohol and does not use drugs.   Family History:  The patient's family history includes Cancer in his mother; Coronary artery disease in his father; Diabetes in his mother.  ROS:  Please see the history of present illness. All other systems are reviewed and otherwise negative.   PHYSICAL EXAM:  VS:  There were no vitals taken for this visit. BMI: There is no height or weight on file to calculate BMI.  GEN- The patient is well appearing, alert and oriented x 3 today.   Lungs- Clear to ausculation bilaterally, normal work of breathing.  Heart- Regular  rate and rhythm, no murmurs, rubs or gallops Extremities- 1-2+ peripheral edema, warm, dry Skin-   device pocket well-healed, no tethering   Device interrogation done today and reviewed by myself:  Battery *** Lead thresholds, impedence, sensing stable *** *** episodes *** changes made today   EKG is ordered. Personal review of EKG from  08/19/2022  shows: A-paced, rate 65bpm; RBBB PR ~300   Recent Labs: 08/12/2022: ALT 20; BUN 23; Creatinine, Ser 1.65; Hemoglobin 11.7; Platelets 242.0;  Potassium 3.6; Sodium 133; TSH 2.98  08/12/2022: Cholesterol 142; HDL 36.40; LDL Cholesterol 69; Total CHOL/HDL Ratio 4; Triglycerides 187.0; VLDL 37.4   CrCl cannot be calculated (Patient's most recent lab result is older than the maximum 21 days allowed.).   Wt Readings from Last 3 Encounters:  09/11/22 164 lb (74.4 kg)  08/19/22 164 lb 9.6 oz (74.7 kg)  08/12/22 161 lb (73 kg)     Additional studies reviewed include: Previous EP, cardiology notes.   TTE, 09/09/2022 1. Left ventricular ejection fraction, by estimation, is 60 to 65%. The left ventricle has normal function. The left ventricle has no regional wall motion abnormalities. Left ventricular diastolic parameters are consistent with Grade I diastolic dysfunction (impaired relaxation).   2. Right ventricular systolic function is normal. The right ventricular size is normal. There is normal pulmonary artery systolic pressure. The estimated right ventricular systolic pressure is 25.1 mmHg.   3. The mitral valve is normal in structure. No evidence of mitral valve regurgitation. No evidence of mitral stenosis.   4. The aortic valve is normal in structure. Aortic valve regurgitation is mild. No aortic stenosis is present.   5. There is borderline dilatation of the aortic root, measuring 37 mm. There is borderline dilatation of the ascending aorta, measuring 38 mm.   6. The inferior vena cava is normal in size with greater than 50% respiratory variability, suggesting right atrial pressure of 3 mmHg.      ASSESSMENT AND PLAN:  #) SND s/p PPM #) Fatigue #) DOE PPM functioning well, see paceart for details Slight adjustment to CLS resting to see if improves HR variability at rest Updated echo shows preserved LVEF, no valvular concerns Recommended lexiscan to eval cors given age and elevated Cr   #) parox AFib Low burden by device CHA2DS2-VASc Score = 3 (HTN, age x 2)  NOAC - eliquis 2.5mg  BID, dose appropriately reduced No bleeding  concerns      Current medicines are reviewed at length with the patient today.   The patient does not have concerns regarding his medicines.  The following changes were made today:  none  Labs/ tests ordered today include:  No orders of the defined types were placed in this encounter.    Disposition: Follow up with EP APP in  6 weeks    Signed, Sherie Don, NP  10/27/22  8:40 PM  Electrophysiology [facility]

## 2022-10-28 ENCOUNTER — Encounter: Payer: Self-pay | Admitting: Cardiology

## 2022-10-28 ENCOUNTER — Ambulatory Visit: Payer: PPO | Attending: Cardiology | Admitting: Cardiology

## 2022-10-28 VITALS — BP 134/78 | HR 66 | Ht 65.0 in | Wt 165.0 lb

## 2022-10-28 DIAGNOSIS — I495 Sick sinus syndrome: Secondary | ICD-10-CM | POA: Diagnosis not present

## 2022-10-28 DIAGNOSIS — Z950 Presence of cardiac pacemaker: Secondary | ICD-10-CM

## 2022-10-28 DIAGNOSIS — R0602 Shortness of breath: Secondary | ICD-10-CM | POA: Diagnosis not present

## 2022-10-28 DIAGNOSIS — R5383 Other fatigue: Secondary | ICD-10-CM | POA: Diagnosis not present

## 2022-10-28 LAB — CUP PACEART INCLINIC DEVICE CHECK
Date Time Interrogation Session: 20240625162709
Implantable Lead Connection Status: 753985
Implantable Lead Connection Status: 753985
Implantable Lead Implant Date: 20060801
Implantable Lead Implant Date: 20060801
Implantable Lead Location: 753859
Implantable Lead Location: 753860
Implantable Lead Model: 350
Implantable Lead Serial Number: 24319010
Implantable Lead Serial Number: 24332742
Implantable Pulse Generator Implant Date: 20141229
Pulse Gen Serial Number: 68145068

## 2022-10-28 NOTE — Patient Instructions (Signed)
Medication Instructions:  Your physician recommends that you continue on your current medications as directed. Please refer to the Current Medication list given to you today.  *If you need a refill on your cardiac medications before your next appointment, please call your pharmacy*   Lab Work: No labs ordered  If you have labs (blood work) drawn today and your tests are completely normal, you will receive your results only by: MyChart Message (if you have MyChart) OR A paper copy in the mail If you have any lab test that is abnormal or we need to change your treatment, we will call you to review the results.   Testing/Procedures: No testing ordered  Follow-Up: At [facility], you and your health needs are our priority.  As part of our continuing mission to provide you with exceptional heart care, we have created designated Provider Care Teams.  These Care Teams include your primary Cardiologist (physician) and Advanced Practice Providers (APPs -  Physician Assistants and Nurse Practitioners) who all work together to provide you with the care you need, when you need it.  We recommend signing up for the patient portal called "MyChart".  Sign up information is provided on this After Visit Summary.  MyChart is used to connect with patients for Virtual Visits (Telemedicine).  Patients are able to view lab/test results, encounter notes, upcoming appointments, etc.  Non-urgent messages can be sent to your provider as well.   To learn more about what you can do with MyChart, go to [email or web address].    Your next appointment:   6 month(s)  Provider:   Steven Klein, MD or Suzann Riddle, NP 

## 2022-11-12 ENCOUNTER — Encounter: Payer: Self-pay | Admitting: *Deleted

## 2022-11-18 ENCOUNTER — Encounter (HOSPITAL_COMMUNITY): Payer: Self-pay

## 2022-11-18 ENCOUNTER — Other Ambulatory Visit: Payer: Self-pay

## 2022-11-18 ENCOUNTER — Other Ambulatory Visit (HOSPITAL_COMMUNITY): Payer: Self-pay

## 2022-11-25 ENCOUNTER — Other Ambulatory Visit (HOSPITAL_COMMUNITY): Payer: Self-pay

## 2022-11-25 DIAGNOSIS — Z9889 Other specified postprocedural states: Secondary | ICD-10-CM | POA: Diagnosis not present

## 2022-11-26 ENCOUNTER — Other Ambulatory Visit: Payer: Self-pay

## 2022-11-26 ENCOUNTER — Other Ambulatory Visit (HOSPITAL_COMMUNITY): Payer: Self-pay

## 2022-12-01 ENCOUNTER — Ambulatory Visit: Payer: Self-pay | Admitting: Student

## 2022-12-03 ENCOUNTER — Ambulatory Visit: Payer: PPO | Admitting: Student

## 2022-12-03 ENCOUNTER — Encounter: Payer: Self-pay | Admitting: Student

## 2022-12-03 VITALS — BP 138/72 | HR 70 | Temp 98.1°F | Ht 65.0 in | Wt 168.0 lb

## 2022-12-03 DIAGNOSIS — N2581 Secondary hyperparathyroidism of renal origin: Secondary | ICD-10-CM | POA: Diagnosis not present

## 2022-12-03 DIAGNOSIS — L821 Other seborrheic keratosis: Secondary | ICD-10-CM

## 2022-12-03 DIAGNOSIS — E0500 Thyrotoxicosis with diffuse goiter without thyrotoxic crisis or storm: Secondary | ICD-10-CM | POA: Diagnosis not present

## 2022-12-03 DIAGNOSIS — L570 Actinic keratosis: Secondary | ICD-10-CM | POA: Diagnosis not present

## 2022-12-03 DIAGNOSIS — I10 Essential (primary) hypertension: Secondary | ICD-10-CM | POA: Diagnosis not present

## 2022-12-03 DIAGNOSIS — Z8546 Personal history of malignant neoplasm of prostate: Secondary | ICD-10-CM

## 2022-12-03 DIAGNOSIS — E785 Hyperlipidemia, unspecified: Secondary | ICD-10-CM | POA: Diagnosis not present

## 2022-12-03 DIAGNOSIS — N1832 Chronic kidney disease, stage 3b: Secondary | ICD-10-CM | POA: Diagnosis not present

## 2022-12-03 DIAGNOSIS — Z950 Presence of cardiac pacemaker: Secondary | ICD-10-CM | POA: Diagnosis not present

## 2022-12-03 DIAGNOSIS — I480 Paroxysmal atrial fibrillation: Secondary | ICD-10-CM

## 2022-12-03 NOTE — Progress Notes (Unsigned)
[facility] clinic [facility]   Provider: Dr. Earnestine Mealing  Code Status: Full Code Goals of Care:     12/03/2022    1:00 PM  Advanced Directives  Does Patient Have a Medical Advance Directive? Yes  Type of Estate agent of [place];Living will  Does patient want to make changes to medical advance directive? No - Patient declined  Copy of Healthcare Power of Attorney in Chart? Yes - validated most recent copy scanned in chart (See row information)     Chief Complaint  Patient presents with   Establish Care    To Establish Care.     HPI: Patient is a 88 y.o. male seen today to establish Care.  He has two areas on his leg and back that have been there for years that he wants to take a look at.   Medical Conditions:  Stable Syncope with overheating. Atrial Fibrillation - never had symptoms. Has a Visual merchandiser.  Hx of prostate cancer s/p chemo and radiation. Last PSA in 2017 Kidney disease and Hyperparathyroidism- stable in April of this year HTN well controlled however previous discussion of changing from hydrochlorothiazide to ACEi   Medications - no concerns.   They have lived here for 16 years since 2008. He is over model trains group on campus. They meet 2x/week meetings. He started in 2013. 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 When the wind is good, they sail boats on the back of boland. They have two children. One in [place] and the other in [place].   Mobility - Nustep machine feet and arms. No falls this year.   Mentation: He worries about his wife getting distracted or forgetful, but her function is fine. Not driving as much.   Matters most: He continues to enjoy model trains.   He was at [employer] before. He was an Art gallery manager. He went to [employer] - in [place]  Past Medical History:  Diagnosis Date   Adenocarcinoma [facility])    Prostate   Allergic rhinitis     Atrial fibrillation (HCC)    Grave's disease    Hyperlipidemia    Hypertension    Prostate cancer (HCC)    Renal insufficiency    sinus node dysfunction    s/p BTK pacemaker    Past Surgical History:  Procedure Laterality Date   CATARACT EXTRACTION W/ INTRAOCULAR LENS  IMPLANT, BILATERAL  2012   PACEMAKER GENERATOR CHANGE N/A 05/02/2013   Procedure: PACEMAKER GENERATOR CHANGE;  Surgeon: Duke Salvia, MD;  Location: [facility] CATH LAB;  Service: Cardiovascular;  Laterality: N/A;   PACEMAKER INSERTION  8/06; 05/02/2013   Bradycardia/ syncope; BTK with generator change 05/02/2013 by Dr Graciela Husbands   Prostate Cancer  2007   RT   TONSILLECTOMY  1947   adenoids    No Known Allergies  Outpatient Encounter Medications as of 12/03/2022  Medication Sig   acetaminophen (TYLENOL) 325 MG tablet Take 650 mg by mouth at bedtime.   apixaban (ELIQUIS) 2.5 MG TABS tablet Take 1 tablet by mouth 2 times daily.   atorvastatin (LIPITOR) 10 MG tablet Take 1 tablet (10 mg total) by mouth daily.   cholecalciferol (VITAMIN D3) 25 MCG (1000 UNIT) tablet Take 1,000 Units by mouth daily. Per patient taking 800 units daily   hydrochlorothiazide (HYDRODIURIL) 25 MG tablet Take 1 tablet (25 mg total) by mouth daily.   loratadine (CLARITIN) 10 MG tablet  Take 10 mg by mouth daily as needed.   metoprolol succinate (TOPROL-XL) 100 MG 24 hr tablet Take 1 tablet (100 mg total) by mouth daily. Take with or immediately following a meal.   Multiple Vitamins-Minerals (CENTRUM SILVER PO) Take 1 tablet by mouth daily.   No facility-administered encounter medications on file as of 12/03/2022.    Review of Systems:  Review of Systems  Health Maintenance  Topic Date Due   COVID-19 Vaccine (9 - 2023-24 season) 10/07/2022   INFLUENZA VACCINE  12/04/2022   Medicare Annual Wellness (AWV)  08/12/2023   DTaP/Tdap/Td (4 - Td or Tdap) 04/12/2024   Pneumonia Vaccine 38+ Years old  Completed   Zoster Vaccines- Shingrix  Completed   HPV  VACCINES  Aged Out    Physical Exam: Vitals:   12/03/22 1256  BP: 138/72  Pulse: 70  Temp: 98.1 F (36.7 C)  SpO2: 99%  Weight: 168 lb (76.2 kg)  Height: 5\' 5"  (1.651 m)   Body mass index is 27.96 kg/m. Physical Exam Vitals reviewed.  Constitutional:      Appearance: Normal appearance.  HENT:     Head: Normocephalic and atraumatic.     Right Ear: Tympanic membrane normal.     Left Ear: Tympanic membrane normal.     Nose: Nose normal.  Eyes:     Pupils: Pupils are equal, round, and reactive to light.  Cardiovascular:     Rate and Rhythm: Normal rate and regular rhythm.     Pulses: Normal pulses.     Heart sounds: Normal heart sounds.  Pulmonary:     Effort: Pulmonary effort is normal.     Breath sounds: Normal breath sounds.  Abdominal:     General: Abdomen is flat. Bowel sounds are normal.     Palpations: Abdomen is soft.  Skin:    General: Skin is warm and dry.  Neurological:     Mental Status: He is alert and oriented to person, place, and time.  Psychiatric:        Behavior: Behavior normal.        Labs reviewed: Basic Metabolic Panel: Recent Labs    08/12/22 0918  NA 133*  K 3.6  CL 94*  CO2 28  GLUCOSE 115*  BUN 23  CREATININE 1.65*  CALCIUM 9.5  PHOS 3.3  TSH 2.98   Liver Function Tests: Recent Labs    08/12/22 0918  AST 18  ALT 20  ALKPHOS 89  BILITOT 0.7  PROT 7.3  ALBUMIN 4.4  4.4   No results for input(s): "LIPASE", "AMYLASE" in the last 8760 hours. No results for input(s): "AMMONIA" in the last 8760 hours. CBC: Recent Labs    08/12/22 0918  WBC 5.3  HGB 11.7*  HCT 33.2*  MCV 99.4  PLT 242.0   Lipid Panel: Recent Labs    08/12/22 0918  CHOL 142  HDL 36.40*  LDLCALC 69  TRIG 187.0*  CHOLHDL 4   Lab Results  Component Value Date   HGBA1C 6.4 08/01/2020    Procedures since last visit: No results found.  Assessment/Plan 1. Paroxysmal atrial fibrillation (HCC) 2. Pacemaker - BIOTRONIK Rate controlled  with pace maker and metoprolol. Continue eliquis 2.5 mg BID given age and renal function.   3. PROSTATE CANCER, HX OF Hx of prostate cancer. S/p treatment with surgery and radiation. No symptoms. No longer under surveillance.   4. Essential hypertension, benign BP well-controlled on hydrochlorothiazide and metoprolol, consider transition to ACEi given current function,  discuss at follow up and repeat labs.   5. Graves disease Most recent TSH 2.98, no changes at this time.   6. Hyperlipidemia, unspecified hyperlipidemia type Lipids at goal on most recent check, cont statin.   7. Actinic keratosis 10. Seborrheic keratoses Numerous Sks and Aks on arms. Image above for continued surveillance. F/u in 6 mo or if there is notable change prior to that time.  8. Secondary hyperparathyroidism of renal origin (HCC) Stable vitamin D on most recent check, no changes at this time.   9. Stage 3b chronic kidney disease (HCC) Continue q6 mo labs. Stable    Labs/tests ordered:  * No order type specified * Next appt:  Visit date not found

## 2022-12-03 NOTE — Patient Instructions (Addendum)
Consider purchasing PreserVision multivitamin.   Please let me know if there are changes in the area on the leg or lower back. I will see you in 6 months.

## 2022-12-11 ENCOUNTER — Encounter: Payer: Self-pay | Admitting: Student

## 2022-12-30 ENCOUNTER — Ambulatory Visit (INDEPENDENT_AMBULATORY_CARE_PROVIDER_SITE_OTHER): Payer: PPO

## 2022-12-30 DIAGNOSIS — I495 Sick sinus syndrome: Secondary | ICD-10-CM

## 2022-12-30 LAB — CUP PACEART REMOTE DEVICE CHECK
Battery Voltage: 40
Date Time Interrogation Session: 20240827092507
Implantable Lead Connection Status: 753985
Implantable Lead Connection Status: 753985
Implantable Lead Implant Date: 20060801
Implantable Lead Implant Date: 20060801
Implantable Lead Location: 753859
Implantable Lead Location: 753860
Implantable Lead Model: 350
Implantable Lead Serial Number: 24319010
Implantable Lead Serial Number: 24332742
Implantable Pulse Generator Implant Date: 20141229
Pulse Gen Serial Number: 68145068

## 2023-01-12 NOTE — Progress Notes (Signed)
Remote pacemaker transmission.   

## 2023-02-12 ENCOUNTER — Other Ambulatory Visit: Payer: Self-pay

## 2023-02-13 ENCOUNTER — Other Ambulatory Visit: Payer: Self-pay

## 2023-02-18 ENCOUNTER — Other Ambulatory Visit (HOSPITAL_COMMUNITY): Payer: Self-pay

## 2023-02-18 ENCOUNTER — Other Ambulatory Visit: Payer: Self-pay | Admitting: Internal Medicine

## 2023-02-18 DIAGNOSIS — I4891 Unspecified atrial fibrillation: Secondary | ICD-10-CM

## 2023-02-18 MED ORDER — APIXABAN 2.5 MG PO TABS
2.5000 mg | ORAL_TABLET | Freq: Two times a day (BID) | ORAL | 1 refills | Status: DC
  Filled 2023-02-18: qty 60, 30d supply, fill #0
  Filled 2023-03-21: qty 60, 30d supply, fill #1
  Filled 2023-05-08 – 2023-05-18 (×4): qty 60, 30d supply, fill #2
  Filled 2023-05-20: qty 60, 30d supply, fill #3

## 2023-02-18 NOTE — Telephone Encounter (Signed)
Eliquis 2.5mg  refill request received. Patient is 88 years old, weight-76.2kg, Crea-1.65 on 08/12/22, Diagnosis-Afib, and last seen by Sherie Don on 10/28/22. Dose is appropriate based on dosing criteria. Will send in refill to requested pharmacy.

## 2023-02-19 ENCOUNTER — Other Ambulatory Visit: Payer: Self-pay

## 2023-03-21 ENCOUNTER — Other Ambulatory Visit (HOSPITAL_COMMUNITY): Payer: Self-pay

## 2023-03-31 ENCOUNTER — Ambulatory Visit: Payer: PPO

## 2023-03-31 DIAGNOSIS — I495 Sick sinus syndrome: Secondary | ICD-10-CM | POA: Diagnosis not present

## 2023-03-31 LAB — CUP PACEART REMOTE DEVICE CHECK
Date Time Interrogation Session: 20241126090425
Implantable Lead Connection Status: 753985
Implantable Lead Connection Status: 753985
Implantable Lead Implant Date: 20060801
Implantable Lead Implant Date: 20060801
Implantable Lead Location: 753859
Implantable Lead Location: 753860
Implantable Lead Model: 350
Implantable Lead Serial Number: 24319010
Implantable Lead Serial Number: 24332742
Implantable Pulse Generator Implant Date: 20141229
Pulse Gen Serial Number: 68145068

## 2023-04-30 NOTE — Progress Notes (Signed)
Remote pacemaker transmission.   

## 2023-05-05 ENCOUNTER — Ambulatory Visit: Payer: PPO | Attending: Internal Medicine | Admitting: Internal Medicine

## 2023-05-05 ENCOUNTER — Encounter: Payer: Self-pay | Admitting: Internal Medicine

## 2023-05-05 VITALS — BP 140/70 | HR 67 | Ht 65.5 in | Wt 169.4 lb

## 2023-05-05 DIAGNOSIS — I495 Sick sinus syndrome: Secondary | ICD-10-CM | POA: Diagnosis not present

## 2023-05-05 DIAGNOSIS — Z950 Presence of cardiac pacemaker: Secondary | ICD-10-CM | POA: Diagnosis not present

## 2023-05-05 DIAGNOSIS — Z79899 Other long term (current) drug therapy: Secondary | ICD-10-CM

## 2023-05-05 DIAGNOSIS — I480 Paroxysmal atrial fibrillation: Secondary | ICD-10-CM

## 2023-05-05 DIAGNOSIS — I4891 Unspecified atrial fibrillation: Secondary | ICD-10-CM

## 2023-05-05 LAB — PACEMAKER DEVICE OBSERVATION

## 2023-05-05 NOTE — Patient Instructions (Addendum)
Medication Instructions:  Your physician recommends that you continue on your current medications as directed. Please refer to the Current Medication list given to you today.  *If you need a refill on your cardiac medications before your next appointment, please call your pharmacy*   Lab Work: CBC today  If you have labs (blood work) drawn today and your tests are completely normal, you will receive your results only by: MyChart Message (if you have MyChart) OR A paper copy in the mail If you have any lab test that is abnormal or we need to change your treatment, we will call you to review the results.   Testing/Procedures: None ordered.    Follow-Up: At [facility], you and your health needs are our priority.  As part of our continuing mission to provide you with exceptional heart care, we have created designated Provider Care Teams.  These Care Teams include your primary Cardiologist (physician) and Advanced Practice Providers (APPs -  Physician Assistants and Nurse Practitioners) who all work together to provide you with the care you need, when you need it.  We recommend signing up for the patient portal called "MyChart".  Sign up information is provided on this After Visit Summary.  MyChart is used to connect with patients for Virtual Visits (Telemedicine).  Patients are able to view lab/test results, encounter notes, upcoming appointments, etc.  Non-urgent messages can be sent to your provider as well.   To learn more about what you can do with MyChart, go to [email or web address].    Your next appointment:   12 months with Dr Graciela Husbands

## 2023-05-05 NOTE — Progress Notes (Signed)
 Patient Care Team: Abdul Fine, MD as PCP - General (Family Medicine) Fernande Elspeth BROCKS, MD as PCP - Electrophysiology (Cardiology) Fate Morna SAILOR, [facility] (Inactive) as Pharmacist (Pharmacist) Dingeldein, Elspeth, MD (Ophthalmology)   HPI  Jason Lambert is a 88 y.o. male Seen in followup for pacemaker Biotronik implanted for sinus node dysfunction and syncope. Generator replacement 12/14.  Found to have atrial fibrillation on his device recordings.  Anticoagulation w apixoban without bleeding    The patient denies chest pain, shortness of breath, nocturnal dyspnea, orthopnea or peripheral edema.  There have been no palpitations or syncope.   Effort intolerance noted 4/24 was addressed with achieving his CLS response; this was further accomplished 6/24 with significant improvement.  Also noted that he had nocturnal tachycardia when he got up to the bathroom  But overall much improved with minimal dyspnea,  still with edema--not bothersome.    [name]     Date Cr K Hgb  12/16 1.46 4.6 12.9  1/18 1.63 4.9 12.3  7/20 1.77 4.0 12.6(2/20)  3/21 1.64 4.2 12.4  3/22 1.65 3.5 12.2  4/24 1.65 3.6 11.7           Past Medical History:  Diagnosis Date   Adenocarcinoma (HCC)    Prostate   Allergic rhinitis    Atrial fibrillation (HCC)    Grave's disease    Hyperlipidemia    Hypertension    Prostate cancer (HCC)    Renal insufficiency    sinus node dysfunction    s/p BTK pacemaker    Past Surgical History:  Procedure Laterality Date   CATARACT EXTRACTION W/ INTRAOCULAR LENS  IMPLANT, BILATERAL  2012   PACEMAKER GENERATOR CHANGE N/A 05/02/2013   Procedure: PACEMAKER GENERATOR CHANGE;  Surgeon: Elspeth BROCKS Fernande, MD;  Location: [facility] CATH LAB;  Service: Cardiovascular;  Laterality: N/A;   PACEMAKER INSERTION  8/06; 05/02/2013   Bradycardia/ syncope; BTK with generator change 05/02/2013 by Dr Fernande   Prostate Cancer  2007   RT    TONSILLECTOMY  1947   adenoids    Current Outpatient Medications  Medication Sig Dispense Refill   acetaminophen  (TYLENOL ) 325 MG tablet Take 650 mg by mouth at bedtime.     apixaban  (ELIQUIS ) 2.5 MG TABS tablet Take 1 tablet by mouth 2 times daily. 180 tablet 1   atorvastatin  (LIPITOR) 10 MG tablet Take 1 tablet (10 mg total) by mouth daily. 90 tablet 3   cholecalciferol (VITAMIN D3) 25 MCG (1000 UNIT) tablet Take 1,000 Units by mouth daily. Per patient taking 800 units daily     hydrochlorothiazide  (HYDRODIURIL ) 25 MG tablet Take 1 tablet (25 mg total) by mouth daily. 90 tablet 3   loratadine (CLARITIN) 10 MG tablet Take 10 mg by mouth daily as needed.     metoprolol  succinate (TOPROL -XL) 100 MG 24 hr tablet Take 1 tablet (100 mg total) by mouth daily. Take with or immediately following a meal. 90 tablet 3   Multiple Vitamins-Minerals (CENTRUM SILVER PO) Take 1 tablet by mouth daily.     No current facility-administered medications for this visit.    No Known Allergies  Review of Systems negative except from HPI and PMH  Physical Exam BP (!) 140/70 (BP Location: Left Arm, Patient Position: Sitting, Cuff Size: Normal)   Pulse 67   Ht 5' 5.5 (1.664 m)   Wt 169 lb 6 oz (76.8 kg)   SpO2 98%   BMI 27.76 kg/m  Well developed and well nourished in no acute distress HENT normal Neck supple with JVP-flat Clear Device pocket well healed; without hematoma or erythema.  There is no tethering  Regular rate and rhythm, no  gallop No  murmur Abd-soft with active BS No Clubbing cyanosis 1-2+ edema Skin-warm and dry A & Oriented  Grossly normal sensory and motor function  ECG atrial pacing at 67 Intervals 26/13/41 Right bundle branch block  Device function is normal. Programming changes none  See Paceart for details       Assessment and  Plan  Sinus node dysfunction ( 99% Ap)  Pacemaker Biotoronik.  .    \ Atrial fibrillation-paroxysmal  Neurocardiogenic  syncope  Hypertension  Volume overload  Renal insufficiency  Gd 3 (GFR 37)   Right bundle branch block new 4/24  Dyspnea much improved with reprogramming of his rate response with CLS  Still with peripheral edema.  Not inclined towards more medication.  Will see how he does.  Have told him that it is really important if there is any erythema to let us  know.  Probably be a candidate for SGLT2  For now continue his hydrochlorothiazide  and metoprolol .  No bleeding.  Continue anticoagulation with apixaban  for his atrial fibrillation  Last hemoglobin was low and we will recheck it today

## 2023-05-06 LAB — CBC
Hematocrit: 32.6 % — ABNORMAL LOW (ref 37.5–51.0)
Hemoglobin: 11.1 g/dL — ABNORMAL LOW (ref 13.0–17.7)
MCH: 34.7 pg — ABNORMAL HIGH (ref 26.6–33.0)
MCHC: 34 g/dL (ref 31.5–35.7)
MCV: 102 fL — ABNORMAL HIGH (ref 79–97)
Platelets: 231 10*3/uL (ref 150–450)
RBC: 3.2 x10E6/uL — ABNORMAL LOW (ref 4.14–5.80)
RDW: 11.8 % (ref 11.6–15.4)
WBC: 7 10*3/uL (ref 3.4–10.8)

## 2023-05-09 ENCOUNTER — Other Ambulatory Visit (HOSPITAL_COMMUNITY): Payer: Self-pay

## 2023-05-10 LAB — CUP PACEART INCLINIC DEVICE CHECK
Date Time Interrogation Session: 20241231143118
Implantable Lead Connection Status: 753985
Implantable Lead Connection Status: 753985
Implantable Lead Implant Date: 20060801
Implantable Lead Implant Date: 20060801
Implantable Lead Location: 753859
Implantable Lead Location: 753860
Implantable Lead Model: 350
Implantable Lead Serial Number: 24319010
Implantable Lead Serial Number: 24332742
Implantable Pulse Generator Implant Date: 20141229
Pulse Gen Serial Number: 68145068

## 2023-05-13 ENCOUNTER — Other Ambulatory Visit (HOSPITAL_COMMUNITY): Payer: Self-pay

## 2023-05-14 ENCOUNTER — Encounter (HOSPITAL_COMMUNITY): Payer: Self-pay

## 2023-05-18 ENCOUNTER — Other Ambulatory Visit: Payer: Self-pay

## 2023-05-18 ENCOUNTER — Other Ambulatory Visit (HOSPITAL_COMMUNITY): Payer: Self-pay

## 2023-05-21 ENCOUNTER — Other Ambulatory Visit: Payer: Self-pay

## 2023-05-25 ENCOUNTER — Encounter: Payer: Self-pay | Admitting: Internal Medicine

## 2023-05-27 ENCOUNTER — Encounter: Payer: Self-pay | Admitting: Internal Medicine

## 2023-05-27 DIAGNOSIS — I4891 Unspecified atrial fibrillation: Secondary | ICD-10-CM

## 2023-05-28 ENCOUNTER — Other Ambulatory Visit (HOSPITAL_COMMUNITY): Payer: Self-pay

## 2023-05-28 MED ORDER — APIXABAN 2.5 MG PO TABS
2.5000 mg | ORAL_TABLET | Freq: Two times a day (BID) | ORAL | 1 refills | Status: DC
Start: 2023-05-28 — End: 2023-06-03
  Filled 2023-05-28: qty 180, 90d supply, fill #0

## 2023-05-28 NOTE — Telephone Encounter (Signed)
Prescription refill request for Eliquis received. Indication: Afib  Last office visit: 05/05/23 Graciela Husbands)  Scr: 1.65 (08/12/22)  Age: 89 Weight: 76.8kg  Appropriate dose. Refill sent.

## 2023-06-01 ENCOUNTER — Ambulatory Visit: Payer: PPO | Admitting: Student

## 2023-06-03 ENCOUNTER — Encounter: Payer: Self-pay | Admitting: Student

## 2023-06-03 ENCOUNTER — Encounter (HOSPITAL_COMMUNITY): Payer: Self-pay

## 2023-06-03 ENCOUNTER — Ambulatory Visit: Payer: PPO | Admitting: Student

## 2023-06-03 ENCOUNTER — Other Ambulatory Visit: Payer: Self-pay

## 2023-06-03 ENCOUNTER — Other Ambulatory Visit (HOSPITAL_COMMUNITY): Payer: Self-pay

## 2023-06-03 VITALS — BP 138/76 | HR 61 | Temp 98.2°F | Ht 65.0 in | Wt 167.0 lb

## 2023-06-03 DIAGNOSIS — I480 Paroxysmal atrial fibrillation: Secondary | ICD-10-CM

## 2023-06-03 DIAGNOSIS — N1832 Chronic kidney disease, stage 3b: Secondary | ICD-10-CM | POA: Diagnosis not present

## 2023-06-03 DIAGNOSIS — I10 Essential (primary) hypertension: Secondary | ICD-10-CM

## 2023-06-03 DIAGNOSIS — D649 Anemia, unspecified: Secondary | ICD-10-CM

## 2023-06-03 DIAGNOSIS — E785 Hyperlipidemia, unspecified: Secondary | ICD-10-CM | POA: Diagnosis not present

## 2023-06-03 DIAGNOSIS — I4891 Unspecified atrial fibrillation: Secondary | ICD-10-CM | POA: Diagnosis not present

## 2023-06-03 DIAGNOSIS — N2581 Secondary hyperparathyroidism of renal origin: Secondary | ICD-10-CM

## 2023-06-03 DIAGNOSIS — E0500 Thyrotoxicosis with diffuse goiter without thyrotoxic crisis or storm: Secondary | ICD-10-CM

## 2023-06-03 DIAGNOSIS — L409 Psoriasis, unspecified: Secondary | ICD-10-CM | POA: Diagnosis not present

## 2023-06-03 MED ORDER — METOPROLOL SUCCINATE ER 100 MG PO TB24
100.0000 mg | ORAL_TABLET | Freq: Every day | ORAL | 3 refills | Status: DC
  Filled 2023-06-03 – 2023-08-09 (×2): qty 90, 90d supply, fill #0
  Filled 2023-11-09: qty 90, 90d supply, fill #1
  Filled 2024-02-06: qty 90, 90d supply, fill #2

## 2023-06-03 MED ORDER — HYDROCHLOROTHIAZIDE 25 MG PO TABS
25.0000 mg | ORAL_TABLET | Freq: Every day | ORAL | 3 refills | Status: DC
  Filled 2023-06-03 – 2023-08-09 (×2): qty 90, 90d supply, fill #0
  Filled 2023-11-09: qty 90, 90d supply, fill #1
  Filled 2024-02-06: qty 90, 90d supply, fill #2

## 2023-06-03 MED ORDER — TRIAMCINOLONE ACETONIDE 0.1 % EX CREA
1.0000 | TOPICAL_CREAM | Freq: Two times a day (BID) | CUTANEOUS | 0 refills | Status: AC
  Filled 2023-06-03: qty 30, 15d supply, fill #0

## 2023-06-03 MED ORDER — ATORVASTATIN CALCIUM 10 MG PO TABS
10.0000 mg | ORAL_TABLET | Freq: Every day | ORAL | 3 refills | Status: DC
  Filled 2023-06-03 – 2023-08-09 (×2): qty 90, 90d supply, fill #0
  Filled 2023-11-09: qty 90, 90d supply, fill #1
  Filled 2024-02-06: qty 90, 90d supply, fill #2

## 2023-06-03 MED ORDER — APIXABAN 2.5 MG PO TABS
2.5000 mg | ORAL_TABLET | Freq: Two times a day (BID) | ORAL | 1 refills | Status: DC
  Filled 2023-06-03 – 2023-06-17 (×3): qty 180, 90d supply, fill #0
  Filled 2023-08-27: qty 180, 90d supply, fill #1

## 2023-06-03 NOTE — Progress Notes (Signed)
Location:  [facility] clinic [facility].   Provider: Dr.  Earnestine Mealing  Code Status: Full Code Goals of Care:     06/03/2023    1:03 PM  Advanced Directives  Does Patient Have a Medical Advance Directive? Yes  Type of Estate agent of [place];Living will  Does patient want to make changes to medical advance directive? No - Patient declined  Copy of Healthcare Power of Attorney in Chart? No - copy requested     Chief Complaint  Patient presents with   Medical Management of Chronic Issues    Medical Management of Chronic Issues. 6 Month follow up    HPI: Patient is a 89 y.o. male seen today for medical management of chronic diseases.   Discussed the use of AI scribe software for clinical note transcription with the patient, who gave verbal consent to proceed.  History of Present Illness   The patient presents with concerns about anemia and skin lesions. He is accompanied by Marjean Donna, who appears to be a caregiver or family member. He was referred by Dr. Graciela Husbands for evaluation of anemia.  Anemia has been a concern since an abnormal CBC test at the beginning of the year, revealing a hemoglobin level of 11.7. He has been experiencing anemia for about four years but was not previously informed about it. Five out of nine components of his blood test were out of range, though the white blood cell count was normal. No blood has been observed in stool or urine. He is currently taking Eliquis, which increases the risk of bleeding.  He has two skin lesions, one on the back and one on the leg. The lesion on the back is unchanged, while the one on the leg has grown, has a scab that bleeds easily, and is described as having 'rolled edges'. The leg lesion has been present for years, and the back lesion has been itchy in the past but is not currently itchy. A Band-Aid is used to cover the back lesion to prevent it from getting on clothing. He has been using a cream recommended by  Marjean Donna, which has been helping with the skin condition, though he is unsure about the specific steroid cream used previously.  He has a history of atrial fibrillation and has a pacemaker. No symptoms related to atrial fibrillation have been experienced since the pacemaker was placed. The pacemaker is checked remotely, possibly quarterly, and he sees his cardiologist annually.  No new issues with urination, such as difficulty emptying his bladder.        Past Medical History:  Diagnosis Date   Adenocarcinoma (HCC)    Prostate   Allergic rhinitis    Atrial fibrillation (HCC)    Grave's disease    Hyperlipidemia    Hypertension    Prostate cancer (HCC)    Renal insufficiency    sinus node dysfunction    s/p BTK pacemaker    Past Surgical History:  Procedure Laterality Date   CATARACT EXTRACTION W/ INTRAOCULAR LENS  IMPLANT, BILATERAL  2012   PACEMAKER GENERATOR CHANGE N/A 05/02/2013   Procedure: PACEMAKER GENERATOR CHANGE;  Surgeon: Duke Salvia, MD;  Location: [facility] CATH LAB;  Service: Cardiovascular;  Laterality: N/A;   PACEMAKER INSERTION  8/06; 05/02/2013   Bradycardia/ syncope; BTK with generator change 05/02/2013 by Dr Graciela Husbands   Prostate Cancer  2007   RT   TONSILLECTOMY  1947   adenoids    No Known Allergies  Outpatient Encounter Medications as  of 06/03/2023  Medication Sig   acetaminophen (TYLENOL) 325 MG tablet Take 650 mg by mouth at bedtime.   apixaban (ELIQUIS) 2.5 MG TABS tablet Take 1 tablet by mouth 2 times daily.   atorvastatin (LIPITOR) 10 MG tablet Take 1 tablet (10 mg total) by mouth daily.   cholecalciferol (VITAMIN D3) 25 MCG (1000 UNIT) tablet Take 1,000 Units by mouth daily. Per patient taking 800 units daily   hydrochlorothiazide (HYDRODIURIL) 25 MG tablet Take 1 tablet (25 mg total) by mouth daily.   loratadine (CLARITIN) 10 MG tablet Take 10 mg by mouth daily as needed.   metoprolol succinate (TOPROL-XL) 100 MG 24 hr tablet Take 1 tablet (100 mg  total) by mouth daily. Take with or immediately following a meal.   Multiple Vitamins-Minerals (CENTRUM SILVER PO) Take 1 tablet by mouth daily.   No facility-administered encounter medications on file as of 06/03/2023.    Review of Systems:  Review of Systems  Health Maintenance  Topic Date Due   COVID-19 Vaccine (9 - 2024-25 season) 01/04/2023   Medicare Annual Wellness (AWV)  08/12/2023   DTaP/Tdap/Td (4 - Td or Tdap) 04/12/2024   Pneumonia Vaccine 73+ Years old  Completed   INFLUENZA VACCINE  Completed   Zoster Vaccines- Shingrix  Completed   HPV VACCINES  Aged Out    Physical Exam: Vitals:   06/03/23 1300  BP: 138/76  Pulse: 61  Temp: 98.2 F (36.8 C)  SpO2: 99%  Weight: 167 lb (75.8 kg)  Height: 5\' 5"  (1.651 m)   Body mass index is 27.79 kg/m. Physical Exam Physical Exam   SKIN: Lesion on back, unchanged from previous examination, scab formation noted, described as more dry now due to winter. Lesion on leg, increased in size, larger ulceration in center, edges rolled, more red around lesion.       Labs reviewed: Basic Metabolic Panel: Recent Labs    08/12/22 0918  NA 133*  K 3.6  CL 94*  CO2 28  GLUCOSE 115*  BUN 23  CREATININE 1.65*  CALCIUM 9.5  PHOS 3.3  TSH 2.98   Liver Function Tests: Recent Labs    08/12/22 0918  AST 18  ALT 20  ALKPHOS 89  BILITOT 0.7  PROT 7.3  ALBUMIN 4.4  4.4   No results for input(s): "LIPASE", "AMYLASE" in the last 8760 hours. No results for input(s): "AMMONIA" in the last 8760 hours. CBC: Recent Labs    08/12/22 0918 05/05/23 1121  WBC 5.3 7.0  HGB 11.7* 11.1*  HCT 33.2* 32.6*  MCV 99.4 102*  PLT 242.0 231   Lipid Panel: Recent Labs    08/12/22 0918  CHOL 142  HDL 36.40*  LDLCALC 69  TRIG 187.0*  CHOLHDL 4   Lab Results  Component Value Date   HGBA1C 6.4 08/01/2020    Procedures since last visit: CUP PACEART INCLINIC DEVICE CHECK Result Date: 05/10/2023 Pacemaker check in clinic. Normal  device function. Testing WDL.  No episodes. Estimated longevity 3.1 years .  Enrolled in Remote follow up. No changes made.Raj Janus, RN Results   LABS Hemoglobin: 11.7 g/dL (16/02/9603) WBC: 7 V40^9/WJ (05/06/2023) RBC: 3.8 x10^6/uL (low) (05/06/2023) MCV: 102 fL (high) (05/06/2023)       Assessment/Plan Assessment and Plan    Anemia Chronic anemia with hemoglobin consistently low for four years, currently at 11.7. Differential includes iron, vitamin B12, and folic acid deficiencies. No recent tests for these deficiencies. No overt signs of blood loss. On Eliquis,  increasing bleeding risk. Hemoglobin drop from 12 to 11 in six months warrants further investigation. - Order labs for iron, vitamin B12, and folic acid levels, if IDA, follow up with Urine and stool evaluation for blood.  - Schedule phlebotomy appointment for tomorrow morning between 7:30 and 8:00 AM - Check kidney function, cholesterol, and thyroid levels - Advise to drink plenty of water and fast before the blood draw  Chronic Skin Lesions Two chronic skin lesions: one on the back (likely psoriasis) and one on the right leg (ulcerated with rolled edges, possible basal cell carcinoma). Biopsy needed for confirmation and potential dermatology referral. - Prescribe triamcinolone steroid cream for the back lesion - Schedule a skin biopsy for the leg lesion - Refer to dermatology if necessary  Atrial Fibrillation with Pacemaker No new symptoms since pacemaker implantation. Pacemaker remotely checked quarterly. No recent medication changes. - Continue current management and remote pacemaker checks - Annual follow-up with cardiologist  General Health Maintenance Routine health maintenance discussed, including blood pressure and cholesterol monitoring. - Monitor blood pressure - Check cholesterol levels  Follow-up - Schedule follow-up appointment for skin biopsy - Annual follow-up with cardiologist - Follow-up after lab  results are available.        Labs/tests ordered:  * No order type specified * Next appt:  Visit date not found

## 2023-06-03 NOTE — Patient Instructions (Signed)
VISIT SUMMARY:  Today, we addressed your concerns about anemia and skin lesions. We discussed your chronic anemia, which has been present for about four years, and the need for further testing. We also examined two skin lesions, one on your back and one on your leg, and planned for a biopsy of the leg lesion. Additionally, we reviewed your atrial fibrillation management and general health maintenance.  YOUR PLAN:  -ANEMIA: Anemia is a condition where you have a lower than normal number of red blood cells. We will conduct tests to check for iron, vitamin B12, and folic acid deficiencies, and also check your urine and stool for microscopic blood. Please drink plenty of water and fast before your blood draw tomorrow morning between 7:30 and 8:00 AM.  -CHRONIC SKIN LESIONS: You have two skin lesions: one on your back, likely psoriasis, and one on your leg that may be basal cell carcinoma. We will prescribe triamcinolone steroid cream for the back lesion and schedule a biopsy for the leg lesion to confirm the diagnosis. If needed, we will refer you to a dermatologist.  -ATRIAL FIBRILLATION WITH PACEMAKER: Atrial fibrillation is an irregular and often rapid heart rate. Your pacemaker is managing this well, and you have not experienced any new symptoms. Continue with your current management and remote pacemaker checks, and follow up with your cardiologist annually.  -GENERAL HEALTH MAINTENANCE: We discussed the importance of routine health maintenance, including monitoring your blood pressure and cholesterol levels.  INSTRUCTIONS:  Please schedule a follow-up appointment for your skin biopsy and another follow-up after your lab results are available. Continue with your annual follow-up with your cardiologist.

## 2023-06-04 DIAGNOSIS — E785 Hyperlipidemia, unspecified: Secondary | ICD-10-CM | POA: Diagnosis not present

## 2023-06-04 DIAGNOSIS — I480 Paroxysmal atrial fibrillation: Secondary | ICD-10-CM | POA: Diagnosis not present

## 2023-06-04 DIAGNOSIS — E0500 Thyrotoxicosis with diffuse goiter without thyrotoxic crisis or storm: Secondary | ICD-10-CM | POA: Diagnosis not present

## 2023-06-04 DIAGNOSIS — D649 Anemia, unspecified: Secondary | ICD-10-CM | POA: Diagnosis not present

## 2023-06-05 ENCOUNTER — Telehealth: Payer: Self-pay | Admitting: Student

## 2023-06-05 DIAGNOSIS — D649 Anemia, unspecified: Secondary | ICD-10-CM

## 2023-06-05 LAB — COMPLETE METABOLIC PANEL WITH GFR
AG Ratio: 1.8 (calc) (ref 1.0–2.5)
ALT: 14 U/L (ref 9–46)
AST: 14 U/L (ref 10–35)
Albumin: 4.4 g/dL (ref 3.6–5.1)
Alkaline phosphatase (APISO): 94 U/L (ref 35–144)
BUN/Creatinine Ratio: 16 (calc) (ref 6–22)
BUN: 29 mg/dL — ABNORMAL HIGH (ref 7–25)
CO2: 30 mmol/L (ref 20–32)
Calcium: 8.9 mg/dL (ref 8.6–10.3)
Chloride: 96 mmol/L — ABNORMAL LOW (ref 98–110)
Creat: 1.77 mg/dL — ABNORMAL HIGH (ref 0.70–1.22)
Globulin: 2.5 g/dL (ref 1.9–3.7)
Glucose, Bld: 141 mg/dL — ABNORMAL HIGH (ref 65–99)
Potassium: 3.4 mmol/L — ABNORMAL LOW (ref 3.5–5.3)
Sodium: 136 mmol/L (ref 135–146)
Total Bilirubin: 0.7 mg/dL (ref 0.2–1.2)
Total Protein: 6.9 g/dL (ref 6.1–8.1)
eGFR: 37 mL/min/{1.73_m2} — ABNORMAL LOW (ref >=60)

## 2023-06-05 LAB — PTH, INTACT AND CALCIUM
Calcium: 8.9 mg/dL (ref 8.6–10.3)
PTH: 123 pg/mL — ABNORMAL HIGH (ref 16–77)

## 2023-06-05 LAB — LIPID PANEL
Cholesterol: 128 mg/dL (ref <200)
HDL: 35 mg/dL — ABNORMAL LOW (ref >=40)
LDL Cholesterol (Calc): 64 mg/dL
Non-HDL Cholesterol (Calc): 93 mg/dL (ref <130)
Total CHOL/HDL Ratio: 3.7 (calc) (ref <5.0)
Triglycerides: 230 mg/dL — ABNORMAL HIGH (ref <150)

## 2023-06-05 LAB — CBC WITH DIFFERENTIAL/PLATELET
Absolute Lymphocytes: 1450 {cells}/uL (ref 850–3900)
Absolute Monocytes: 1340 {cells}/uL — ABNORMAL HIGH (ref 200–950)
Basophils Absolute: 20 {cells}/uL (ref 0–200)
Basophils Relative: 0.4 %
Eosinophils Absolute: 20 {cells}/uL (ref 15–500)
Eosinophils Relative: 0.4 %
HCT: 30.5 % — ABNORMAL LOW (ref 38.5–50.0)
Hemoglobin: 10.6 g/dL — ABNORMAL LOW (ref 13.2–17.1)
MCH: 34.2 pg — ABNORMAL HIGH (ref 27.0–33.0)
MCHC: 34.8 g/dL (ref 32.0–36.0)
MCV: 98.4 fL (ref 80.0–100.0)
MPV: 10.8 fL (ref 7.5–12.5)
Monocytes Relative: 26.8 %
Neutro Abs: 2170 {cells}/uL (ref 1500–7800)
Neutrophils Relative %: 43.4 %
Platelets: 205 10*3/uL (ref 140–400)
RBC: 3.1 10*6/uL — ABNORMAL LOW (ref 4.20–5.80)
RDW: 12.1 % (ref 11.0–15.0)
Total Lymphocyte: 29 %
WBC: 5 10*3/uL (ref 3.8–10.8)

## 2023-06-05 LAB — IRON,TIBC AND FERRITIN PANEL
%SAT: 41 % (ref 20–48)
Ferritin: 34 ng/mL (ref 24–380)
Iron: 132 ug/dL (ref 50–180)
TIBC: 325 ug/dL (ref 250–425)

## 2023-06-05 LAB — VITAMIN B12: Vitamin B-12: 301 pg/mL (ref 200–1100)

## 2023-06-05 LAB — TSH: TSH: 3.64 m[IU]/L (ref 0.40–4.50)

## 2023-06-05 LAB — FOLATE: Folate: >24 ng/mL

## 2023-06-05 NOTE — Addendum Note (Signed)
Addended by: Nelda Severe A on: 06/05/2023 09:27 AM   Modules accepted: Orders

## 2023-06-05 NOTE — Telephone Encounter (Signed)
Called patient to disucss lab results. Anemia worsened. Iron levels and b12 normal. Will collect additional labs for work up. Based on results, consider hemetology referral. Labs on Monday.

## 2023-06-08 DIAGNOSIS — R739 Hyperglycemia, unspecified: Secondary | ICD-10-CM | POA: Diagnosis not present

## 2023-06-08 DIAGNOSIS — D649 Anemia, unspecified: Secondary | ICD-10-CM | POA: Diagnosis not present

## 2023-06-08 DIAGNOSIS — N390 Urinary tract infection, site not specified: Secondary | ICD-10-CM | POA: Diagnosis not present

## 2023-06-09 LAB — URINALYSIS, ROUTINE W REFLEX MICROSCOPIC
Bilirubin Urine: NEGATIVE
Glucose, UA: NEGATIVE
Hgb urine dipstick: NEGATIVE
Ketones, ur: NEGATIVE
Leukocytes,Ua: NEGATIVE
Nitrite: NEGATIVE
Protein, ur: NEGATIVE
Specific Gravity, Urine: 1.019 (ref 1.001–1.035)
pH: 6.5 (ref 5.0–8.0)

## 2023-06-09 LAB — URINE CULTURE
MICRO NUMBER:: 16032435
Result:: NO GROWTH
SPECIMEN QUALITY:: ADEQUATE

## 2023-06-10 ENCOUNTER — Other Ambulatory Visit: Payer: Self-pay | Admitting: *Deleted

## 2023-06-10 DIAGNOSIS — D649 Anemia, unspecified: Secondary | ICD-10-CM

## 2023-06-10 LAB — CBC WITH DIFFERENTIAL/PLATELET
Absolute Lymphocytes: 1859 {cells}/uL (ref 850–3900)
Absolute Monocytes: 1280 {cells}/uL — ABNORMAL HIGH (ref 200–950)
Basophils Absolute: 18 {cells}/uL (ref 0–200)
Basophils Relative: 0.3 %
Eosinophils Absolute: 41 {cells}/uL (ref 15–500)
Eosinophils Relative: 0.7 %
HCT: 32.6 % — ABNORMAL LOW (ref 38.5–50.0)
Hemoglobin: 11.1 g/dL — ABNORMAL LOW (ref 13.2–17.1)
MCH: 34.7 pg — ABNORMAL HIGH (ref 27.0–33.0)
MCHC: 34 g/dL (ref 32.0–36.0)
MCV: 101.9 fL — ABNORMAL HIGH (ref 80.0–100.0)
MPV: 11.5 fL (ref 7.5–12.5)
Monocytes Relative: 21.7 %
Neutro Abs: 2702 {cells}/uL (ref 1500–7800)
Neutrophils Relative %: 45.8 %
Platelets: 233 10*3/uL (ref 140–400)
RBC: 3.2 10*6/uL — ABNORMAL LOW (ref 4.20–5.80)
RDW: 11.9 % (ref 11.0–15.0)
Total Lymphocyte: 31.5 %
WBC: 5.9 10*3/uL (ref 3.8–10.8)

## 2023-06-10 LAB — HEMOGLOBIN A1C
Hgb A1c MFr Bld: 6.7 %{Hb} — ABNORMAL HIGH (ref <5.7)
Mean Plasma Glucose: 146 mg/dL
eAG (mmol/L): 8.1 mmol/L

## 2023-06-10 LAB — C-REACTIVE PROTEIN: CRP: 3.7 mg/L (ref <8.0)

## 2023-06-10 LAB — RETICULOCYTES
ABS Retic: 67200 {cells}/uL (ref 25000–90000)
Retic Ct Pct: 2.1 %

## 2023-06-10 LAB — SEDIMENTATION RATE: Sed Rate: 28 mm/h — ABNORMAL HIGH (ref 0–20)

## 2023-06-11 ENCOUNTER — Telehealth: Payer: Self-pay | Admitting: Student

## 2023-06-11 DIAGNOSIS — D649 Anemia, unspecified: Secondary | ICD-10-CM

## 2023-06-11 LAB — FECAL GLOBIN BY IMMUNOCHEMISTRY

## 2023-06-11 NOTE — Telephone Encounter (Signed)
 Contacted patient to discuss recent lab results. In general reassuring. Discussed elevated ESR and will add on ANA with cascade to evaluate for autoimmune process. Consider blood smear. Urine negative for blood. Will see patient on Monday for skin biopsy.

## 2023-06-11 NOTE — Addendum Note (Signed)
 Addended by: Audon Heymann on: 06/11/2023 07:29 PM   Modules accepted: Orders

## 2023-06-12 ENCOUNTER — Encounter (HOSPITAL_COMMUNITY): Payer: Self-pay

## 2023-06-12 ENCOUNTER — Other Ambulatory Visit (HOSPITAL_COMMUNITY): Payer: Self-pay

## 2023-06-12 ENCOUNTER — Other Ambulatory Visit: Payer: Self-pay

## 2023-06-15 ENCOUNTER — Ambulatory Visit: Payer: PPO | Admitting: Student

## 2023-06-15 ENCOUNTER — Other Ambulatory Visit: Payer: Self-pay

## 2023-06-15 ENCOUNTER — Other Ambulatory Visit: Payer: Self-pay | Admitting: Student

## 2023-06-15 DIAGNOSIS — D489 Neoplasm of uncertain behavior, unspecified: Secondary | ICD-10-CM | POA: Diagnosis not present

## 2023-06-15 DIAGNOSIS — L308 Other specified dermatitis: Secondary | ICD-10-CM | POA: Diagnosis not present

## 2023-06-15 DIAGNOSIS — C44712 Basal cell carcinoma of skin of right lower limb, including hip: Secondary | ICD-10-CM | POA: Diagnosis not present

## 2023-06-15 DIAGNOSIS — C4491 Basal cell carcinoma of skin, unspecified: Secondary | ICD-10-CM

## 2023-06-15 HISTORY — DX: Basal cell carcinoma of skin, unspecified: C44.91

## 2023-06-15 NOTE — Progress Notes (Signed)
[facility] Provider: Sydnee Cabal  Code Status: Full Code Goals of Care:     06/03/2023    1:03 PM  Advanced Directives  Does Patient Have a Medical Advance Directive? Yes  Type of Estate agent of [place];Living will  Does patient want to make changes to medical advance directive? No - Patient declined  Copy of Healthcare Power of Attorney in Chart? No - copy requested     Chief Complaint  Patient presents with   Skin Biopsy    HPI: Patient is a 89 y.o. male seen today for medical management of chronic diseases.   Ulceration that has grown and changed in color and size over the last few months.    Past Medical History:  Diagnosis Date   Adenocarcinoma (HCC)    Prostate   Allergic rhinitis    Atrial fibrillation (HCC)    Grave's disease    Hyperlipidemia    Hypertension    Prostate cancer (HCC)    Renal insufficiency    sinus node dysfunction    s/p BTK pacemaker    Past Surgical History:  Procedure Laterality Date   CATARACT EXTRACTION W/ INTRAOCULAR LENS  IMPLANT, BILATERAL  2012   PACEMAKER GENERATOR CHANGE N/A 05/02/2013   Procedure: PACEMAKER GENERATOR CHANGE;  Surgeon: Duke Salvia, MD;  Location: [facility] CATH LAB;  Service: Cardiovascular;  Laterality: N/A;   PACEMAKER INSERTION  8/06; 05/02/2013   Bradycardia/ syncope; BTK with generator change 05/02/2013 by Dr Graciela Husbands   Prostate Cancer  2007   RT   TONSILLECTOMY  1947   adenoids    No Known Allergies  Outpatient Encounter Medications as of 06/15/2023  Medication Sig   acetaminophen (TYLENOL) 325 MG tablet Take 650 mg by mouth at bedtime.   apixaban (ELIQUIS) 2.5 MG TABS tablet Take 1 tablet (2.5 mg total) by mouth 2 (two) times daily.   atorvastatin (LIPITOR) 10 MG tablet Take 1 tablet (10 mg total) by mouth daily.   cholecalciferol (VITAMIN D3) 25 MCG (1000 UNIT) tablet Take 1,000 Units by mouth daily. Per patient taking 800 units daily    hydrochlorothiazide (HYDRODIURIL) 25 MG tablet Take 1 tablet (25 mg total) by mouth daily.   loratadine (CLARITIN) 10 MG tablet Take 10 mg by mouth daily as needed.   metoprolol succinate (TOPROL-XL) 100 MG 24 hr tablet Take 1 tablet (100 mg total) by mouth daily. Take with or immediately following a meal.   Multiple Vitamins-Minerals (CENTRUM SILVER PO) Take 1 tablet by mouth daily.   triamcinolone cream (KENALOG) 0.1 % Apply 1 Application topically 2 (two) times daily.   No facility-administered encounter medications on file as of 06/15/2023.    Review of Systems:  Review of Systems  Health Maintenance  Topic Date Due   COVID-19 Vaccine (9 - 2024-25 season) 01/04/2023   Medicare Annual Wellness (AWV)  08/12/2023   DTaP/Tdap/Td (4 - Td or Tdap) 04/12/2024   Pneumonia Vaccine 21+ Years old  Completed   INFLUENZA VACCINE  Completed   Zoster Vaccines- Shingrix  Completed   HPV VACCINES  Aged Out    Physical Exam: There were no vitals filed for this visit. There is no height or weight on file to calculate BMI. Physical Exam Physical Exam          Labs reviewed: Basic Metabolic Panel: Recent Labs    08/12/22 0918 06/04/23 0747  NA 133* 136  K 3.6 3.4*  CL 94* 96*  CO2 28 30  GLUCOSE 115* 141*  BUN 23 29*  CREATININE 1.65* 1.77*  CALCIUM 9.5 8.9  8.9  PHOS 3.3  --   TSH 2.98 3.64   Liver Function Tests: Recent Labs    08/12/22 0918 06/04/23 0747  AST 18 14  ALT 20 14  ALKPHOS 89  --   BILITOT 0.7 0.7  PROT 7.3 6.9  ALBUMIN 4.4  4.4  --    No results for input(s): "LIPASE", "AMYLASE" in the last 8760 hours. No results for input(s): "AMMONIA" in the last 8760 hours. CBC: Recent Labs    05/05/23 1121 06/04/23 0747 06/08/23 0743  WBC 7.0 5.0 5.9  NEUTROABS  --  2,170 2,702  HGB 11.1* 10.6* 11.1*  HCT 32.6* 30.5* 32.6*  MCV 102* 98.4 101.9*  PLT 231 205 233   Lipid Panel: Recent Labs    08/12/22 0918 06/04/23 0747  CHOL 142 128  HDL 36.40* 35*   LDLCALC 69 64  TRIG 187.0* 230*  CHOLHDL 4 3.7   Lab Results  Component Value Date   HGBA1C 6.7 (H) 06/08/2023    Procedures since last visit: No results found. Results          Assessment/Plan Neoplasm of uncertain behavior [D48.9] - Plan: Pathology Report ([facility]), Pathology Report ([facility]) Here's a dot phrase for your assessment and plan based on the provided information: Verbal and written consent performed. Two 4mm skin biopsies performed on the right calf for neoplasm of uncertain behavior. The lesions were excised for histopathologic evaluation. The neoplasm is of uncertain behavior, and further evaluation of the pathology report will be necessary to determine if additional management is required. 1% lidocaine with epinephrine was used for local anesthesia, and the wounds were closed using a figure-of-eight stitch with 4-0 nylon suture.  Blood loss was minimal, approximately 5mL, with no complications during the procedure.  - Await pathology results for both biopsies. If the neoplasm is benign, no further action will be required. If the lesion is malignant or has uncertain behavior, additional management such as further excision, surveillance, or referral may be indicated. - Instruct the patient to keep the biopsy sites clean and dry, and to avoid any excessive tension or movement on the areas. - Follow-up in 7 days for suture removal and to discuss pathology results. - Recommend over-the-counter pain management (e.g., acetaminophen or ibuprofen) as needed for any post-procedure discomfort. - Monitor for signs of infection (increased redness, warmth, swelling, or drainage). If any of these occur, the patient should contact the office immediately.  Would you like any adjustments or additions to this? Labs/tests ordered:  * No order type specified * Next appt:  06/16/2023

## 2023-06-16 ENCOUNTER — Telehealth: Payer: Self-pay

## 2023-06-16 NOTE — Telephone Encounter (Signed)
Debbie with [facility] called stating she needs more information about this patient and specimen (skin biopsy) sent in yesterday. State the birthday is not clear (I verified) and that no Requisition was received.   I gave Eunice Blase Dr.Beamer's contact number to speak with her directly, as she was working offsite yesterday and I could not deny or confirm how the specimen was sent.

## 2023-06-16 NOTE — Telephone Encounter (Signed)
Received phone call from Forrestine Him regarding samples from yesterday. Received and need requisition. Specimens were collected from [facility]. Will fax requisition with Debbie's Attention.

## 2023-06-17 ENCOUNTER — Other Ambulatory Visit (HOSPITAL_COMMUNITY): Payer: Self-pay

## 2023-06-17 ENCOUNTER — Encounter: Payer: Self-pay | Admitting: Student

## 2023-06-17 ENCOUNTER — Other Ambulatory Visit: Payer: Self-pay

## 2023-06-18 ENCOUNTER — Other Ambulatory Visit: Payer: Self-pay

## 2023-06-18 ENCOUNTER — Telehealth: Payer: Self-pay | Admitting: Student

## 2023-06-18 DIAGNOSIS — C4491 Basal cell carcinoma of skin, unspecified: Secondary | ICD-10-CM

## 2023-06-18 LAB — PATHOLOGY REPORT

## 2023-06-18 LAB — TISSUE PATH REPORT

## 2023-06-20 NOTE — Telephone Encounter (Signed)
Contacted patient to discusss results. He would prefer to go to [facility] with Dr. Lu Duffel. Referral placed

## 2023-06-22 ENCOUNTER — Ambulatory Visit: Payer: PPO | Admitting: Student

## 2023-06-22 DIAGNOSIS — C4491 Basal cell carcinoma of skin, unspecified: Secondary | ICD-10-CM | POA: Diagnosis not present

## 2023-06-22 DIAGNOSIS — D489 Neoplasm of uncertain behavior, unspecified: Secondary | ICD-10-CM | POA: Diagnosis not present

## 2023-06-23 ENCOUNTER — Encounter: Payer: Self-pay | Admitting: Dermatology

## 2023-06-23 ENCOUNTER — Ambulatory Visit: Payer: PPO | Admitting: Dermatology

## 2023-06-23 ENCOUNTER — Encounter: Payer: Self-pay | Admitting: Student

## 2023-06-23 DIAGNOSIS — C44519 Basal cell carcinoma of skin of other part of trunk: Secondary | ICD-10-CM

## 2023-06-23 DIAGNOSIS — D492 Neoplasm of unspecified behavior of bone, soft tissue, and skin: Secondary | ICD-10-CM | POA: Diagnosis not present

## 2023-06-23 DIAGNOSIS — C44712 Basal cell carcinoma of skin of right lower limb, including hip: Secondary | ICD-10-CM | POA: Diagnosis not present

## 2023-06-23 DIAGNOSIS — Z7189 Other specified counseling: Secondary | ICD-10-CM

## 2023-06-23 NOTE — Progress Notes (Signed)
[facility]  Provider: Sydnee Cabal  Code Status: Full Code Goals of Care:     06/03/2023    1:03 PM  Advanced Directives  Does Patient Have a Medical Advance Directive? Yes  Type of Estate agent of [place];Living will  Does patient want to make changes to medical advance directive? No - Patient declined  Copy of Healthcare Power of Attorney in Chart? No - copy requested     Chief Complaint  Patient presents with   Skin Cancer    HPI: Patient is a 89 y.o. male seen today for an acute visit for Discussed the use of AI scribe software for clinical note transcription with the patient, who gave verbal consent to proceed.  History of Present Illness   Jason Lambert is an 89 year old male with basal cell carcinoma who presents for follow-up on skin biopsy results and management.  He recently had a biopsy for basal cell carcinoma, and the results were discussed over the phone. He has an appointment scheduled for further management of the condition.  He has noticed some redness around the biopsy site and has been applying Vaseline, which has led to maceration. He has been covering the biopsy site with a Band-Aid. No drainage from the site has been observed.  He has been using a steroid cream on another area of his skin without much improvement. The lesion has become darker and the borders more irregular, resembling 'a cauliflower and previously was more egg'.  He served in [employer] and grew up in the [place].        Past Medical History:  Diagnosis Date   Adenocarcinoma (HCC)    Prostate   Allergic rhinitis    Atrial fibrillation (HCC)    BCC (basal cell carcinoma of skin) 06/15/2023   R lower leg, MOHs pending   Grave's disease    Hyperlipidemia    Hypertension    Prostate cancer (HCC)    Renal insufficiency    sinus node dysfunction    s/p BTK pacemaker    Past Surgical History:  Procedure Laterality Date    CATARACT EXTRACTION W/ INTRAOCULAR LENS  IMPLANT, BILATERAL  2012   PACEMAKER GENERATOR CHANGE N/A 05/02/2013   Procedure: PACEMAKER GENERATOR CHANGE;  Surgeon: Duke Salvia, MD;  Location: [facility] CATH LAB;  Service: Cardiovascular;  Laterality: N/A;   PACEMAKER INSERTION  8/06; 05/02/2013   Bradycardia/ syncope; BTK with generator change 05/02/2013 by Dr Graciela Husbands   Prostate Cancer  2007   RT   TONSILLECTOMY  1947   adenoids    No Known Allergies  Outpatient Encounter Medications as of 06/22/2023  Medication Sig   acetaminophen (TYLENOL) 325 MG tablet Take 650 mg by mouth at bedtime.   apixaban (ELIQUIS) 2.5 MG TABS tablet Take 1 tablet (2.5 mg total) by mouth 2 (two) times daily.   atorvastatin (LIPITOR) 10 MG tablet Take 1 tablet (10 mg total) by mouth daily.   cholecalciferol (VITAMIN D3) 25 MCG (1000 UNIT) tablet Take 1,000 Units by mouth daily. Per patient taking 800 units daily   hydrochlorothiazide (HYDRODIURIL) 25 MG tablet Take 1 tablet (25 mg total) by mouth daily.   loratadine (CLARITIN) 10 MG tablet Take 10 mg by mouth daily as needed.   metoprolol succinate (TOPROL-XL) 100 MG 24 hr tablet Take 1 tablet (100 mg total) by mouth daily. Take with or immediately following a meal.   Multiple Vitamins-Minerals (CENTRUM SILVER PO) Take  1 tablet by mouth daily.   triamcinolone cream (KENALOG) 0.1 % Apply 1 Application topically 2 (two) times daily.   No facility-administered encounter medications on file as of 06/22/2023.    Review of Systems:  Review of Systems  Health Maintenance  Topic Date Due   COVID-19 Vaccine (9 - 2024-25 season) 01/04/2023   Medicare Annual Wellness (AWV)  08/12/2023   DTaP/Tdap/Td (4 - Td or Tdap) 04/12/2024   Pneumonia Vaccine 46+ Years old  Completed   INFLUENZA VACCINE  Completed   Zoster Vaccines- Shingrix  Completed   HPV VACCINES  Aged Out    Physical Exam: There were no vitals filed for this visit. There is no height or weight on file to  calculate BMI. Physical Exam        Labs reviewed: Basic Metabolic Panel: Recent Labs    08/12/22 0918 06/04/23 0747  NA 133* 136  K 3.6 3.4*  CL 94* 96*  CO2 28 30  GLUCOSE 115* 141*  BUN 23 29*  CREATININE 1.65* 1.77*  CALCIUM 9.5 8.9  8.9  PHOS 3.3  --   TSH 2.98 3.64   Liver Function Tests: Recent Labs    08/12/22 0918 06/04/23 0747  AST 18 14  ALT 20 14  ALKPHOS 89  --   BILITOT 0.7 0.7  PROT 7.3 6.9  ALBUMIN 4.4  4.4  --    No results for input(s): "LIPASE", "AMYLASE" in the last 8760 hours. No results for input(s): "AMMONIA" in the last 8760 hours. CBC: Recent Labs    05/05/23 1121 06/04/23 0747 06/08/23 0743  WBC 7.0 5.0 5.9  NEUTROABS  --  2,170 2,702  HGB 11.1* 10.6* 11.1*  HCT 32.6* 30.5* 32.6*  MCV 102* 98.4 101.9*  PLT 231 205 233   Lipid Panel: Recent Labs    08/12/22 0918 06/04/23 0747  CHOL 142 128  HDL 36.40* 35*  LDLCALC 69 64  TRIG 187.0* 230*  CHOLHDL 4 3.7   Lab Results  Component Value Date   HGBA1C 6.7 (H) 06/08/2023    Procedures since last visit: No results found. resolved  Assessment/Plan There are no diagnoses linked to this encounter. Assessment and Plan    Basal Cell Carcinoma Confirmed basal cell carcinoma with redness and maceration due to excessive moisture from Vaseline application. Scheduled for dermatology appointment tomorrow for potential curettage or excision. Excision preferred for margin assessment to ensure complete removal. Discussed excision involves an elliptical cut for thorough cancer cell removal. Healing may be poor until removal; suture irritation may contribute to redness. - Remove sutures - Advise against Vaseline application tonight - Apply a dry Band-Aid - Follow up with dermatology appointment tomorrow  Neoplasm of Uncertain Behavior NUBS managed with steroid cream. Lesion on back appears darker with irregular borders compared to previous visit; size unchanged. Discussed  potential for stronger steroid prescription by dermatologist versus biopsy with dermatology.  - Refer to dermatologist for evaluation and possible stronger steroid prescription - Document and share images with dermatologist  Follow-up - Schedule follow-up appointment in April - Advise patient to contact clinic if new issues arise.          Labs/tests ordered:  * No order type specified * Next appt:  09/02/2023

## 2023-06-23 NOTE — Progress Notes (Signed)
   New Patient Visit   Subjective  Jason Lambert is a 89 y.o. male who presents for the following: pt referred by Dr. Sydnee Cabal his PCP, pt had 2 biopsies done at site proven BCC at R lower leg. Pt also has spot on back x years he would like looked at, can be itchy at times, at one point did bleed and form a scab.   The patient has spots, moles and lesions to be evaluated, some may be new or changing and the patient may have concern these could be cancer.   The following portions of the chart were reviewed this encounter and updated as appropriate: medications, allergies, medical history  Review of Systems:  No other skin or systemic complaints except as noted in HPI or Assessment and Plan.  Objective  Well appearing patient in no apparent distress; mood and affect are within normal limits.   A focused examination was performed of the following areas: R leg  Relevant exam findings are noted in the Assessment and Plan.    Mid Back 22mm pink plaque with telangectasia and pigment globules     Assessment & Plan    Basal Cell Carcinoma- bx proven  Exam: R lower leg pink ulcerated plaque  Plan: -BCC rarely spreads but has made a nonhealing wound. given size, ulceration, and location, best approach is [facility] surgery.   Mohs surgery involves cutting out right around the spot and then checking under the microscope to be sure the whole skin cancer is out. The cure rate is about 98-99%. It is done at another office outside of [place] ([place], [place], or [place]) by a specialist. Once the Merchant navy officer confirms the skin cancer is out, they will discuss the options to repair or heal the area. Often surgical reconstruction of the area is necessary and is done at the same visit after the skin cancer has been removed.  You must take it easy for about two weeks after surgery (no lifting over 10-15 lbs, avoid activity to get your heart rate and blood pressure up).    Will send referral  to Dr. Caralyn Guile for Mohs.   BASAL CELL CARCINOMA OF SKIN OF RIGHT LOWER LIMB, INCLUDING HIP   Related Procedures Ambulatory referral to Dermatology NEOPLASM OF SKIN Mid Back Skin / nail biopsy Type of biopsy: tangential   Informed consent: discussed and consent obtained   Timeout: patient name, date of birth, surgical site, and procedure verified   Procedure prep:  Patient was prepped and draped in usual sterile fashion Prep type:  Isopropyl alcohol Anesthesia: the lesion was anesthetized in a standard fashion   Anesthetic:  1% lidocaine w/ epinephrine 1-100,000 buffered w/ 8.4% NaHCO3 Instrument used: DermaBlade   Hemostasis achieved with: pressure and aluminum chloride   Outcome: patient tolerated procedure well   Post-procedure details: sterile dressing applied and wound care instructions given   Dressing type: bandage and petrolatum   Specimen 1 - Surgical pathology Differential Diagnosis: R/o BCC  Check Margins: No 22mm pink plaque with telangectasia and pigment globules  Return for TBSE, w/ Dr. Katrinka Blazing.  Wynonia Lawman, CMA, am acting as scribe for Elie Goody, MD .   Documentation: I have reviewed the above documentation for accuracy and completeness, and I agree with the above.  Elie Goody, MD

## 2023-06-23 NOTE — Patient Instructions (Addendum)

## 2023-06-24 LAB — SURGICAL PATHOLOGY

## 2023-06-25 ENCOUNTER — Encounter: Payer: Self-pay | Admitting: Dermatology

## 2023-06-29 ENCOUNTER — Telehealth: Payer: Self-pay

## 2023-06-29 DIAGNOSIS — C44519 Basal cell carcinoma of skin of other part of trunk: Secondary | ICD-10-CM

## 2023-06-29 NOTE — Telephone Encounter (Signed)
 Patient's wife advised bx results showed BCC, referral sent to Dr. Caralyn Guile for Mohs. Butch Penny., RMA

## 2023-06-29 NOTE — Telephone Encounter (Signed)
-----   Message from [place] sent at 06/27/2023  8:36 PM EST ----- Diagnosis: mid back :       BASAL CELL CARCINOMA, SUPERFICIAL AND NODULAR PATTERNS    Please call with diagnosis and REFER TO DR PACI FOR MOHS. Dr Caralyn Guile will do both cases on the same day and is already aware of the patient  Explanation: your biopsy shows a basal cell skin cancer in the second layer of the skin. This is the most common kind of skin cancer and is caused by damage from sun exposure. Basal cell skin cancers almost never spread beyond the skin, so they are not dangerous to your overall health. However, they will continue to grow, can bleed, cause nonhealing wounds, and disrupt nearby structures unless fully treated.  Treatment: Given the location and type of skin cancer, I recommend Mohs surgery. Dr Caralyn Guile will treat the cancer on your back and the cancer on your right leg on the same day.

## 2023-06-30 ENCOUNTER — Ambulatory Visit (INDEPENDENT_AMBULATORY_CARE_PROVIDER_SITE_OTHER): Payer: PPO

## 2023-06-30 DIAGNOSIS — I495 Sick sinus syndrome: Secondary | ICD-10-CM | POA: Diagnosis not present

## 2023-07-01 LAB — CUP PACEART REMOTE DEVICE CHECK
Battery Voltage: 35
Date Time Interrogation Session: 20250225142027
Implantable Lead Connection Status: 753985
Implantable Lead Connection Status: 753985
Implantable Lead Implant Date: 20060801
Implantable Lead Implant Date: 20060801
Implantable Lead Location: 753859
Implantable Lead Location: 753860
Implantable Lead Model: 350
Implantable Lead Serial Number: 24319010
Implantable Lead Serial Number: 24332742
Implantable Pulse Generator Implant Date: 20141229
Pulse Gen Serial Number: 68145068

## 2023-07-07 ENCOUNTER — Ambulatory Visit: Payer: PPO | Admitting: Dermatology

## 2023-07-07 ENCOUNTER — Encounter: Payer: Self-pay | Admitting: Dermatology

## 2023-07-07 DIAGNOSIS — L578 Other skin changes due to chronic exposure to nonionizing radiation: Secondary | ICD-10-CM

## 2023-07-07 DIAGNOSIS — C44712 Basal cell carcinoma of skin of right lower limb, including hip: Secondary | ICD-10-CM

## 2023-07-07 DIAGNOSIS — L570 Actinic keratosis: Secondary | ICD-10-CM

## 2023-07-07 DIAGNOSIS — L821 Other seborrheic keratosis: Secondary | ICD-10-CM | POA: Diagnosis not present

## 2023-07-07 DIAGNOSIS — L820 Inflamed seborrheic keratosis: Secondary | ICD-10-CM

## 2023-07-07 DIAGNOSIS — D1801 Hemangioma of skin and subcutaneous tissue: Secondary | ICD-10-CM | POA: Diagnosis not present

## 2023-07-07 DIAGNOSIS — W908XXA Exposure to other nonionizing radiation, initial encounter: Secondary | ICD-10-CM | POA: Diagnosis not present

## 2023-07-07 DIAGNOSIS — C44519 Basal cell carcinoma of skin of other part of trunk: Secondary | ICD-10-CM

## 2023-07-07 DIAGNOSIS — L814 Other melanin hyperpigmentation: Secondary | ICD-10-CM

## 2023-07-07 DIAGNOSIS — Z1283 Encounter for screening for malignant neoplasm of skin: Secondary | ICD-10-CM

## 2023-07-07 DIAGNOSIS — D229 Melanocytic nevi, unspecified: Secondary | ICD-10-CM

## 2023-07-07 NOTE — Progress Notes (Signed)
 Follow-Up Visit   Subjective  Jason Lambert is a 89 y.o. male who presents for the following: Skin Cancer Screening and Full Body Skin Exam, Hx BCCs-MOHs pending, place at L ear he would like looked at.  The patient presents for Total-Body Skin Exam (TBSE) for skin cancer screening and mole check. The patient has spots, moles and lesions to be evaluated, some may be new or changing and the patient may have concern these could be cancer.   The following portions of the chart were reviewed this encounter and updated as appropriate: medications, allergies, medical history  Review of Systems:  No other skin or systemic complaints except as noted in HPI or Assessment and Plan.  Objective  Well appearing patient in no apparent distress; mood and affect are within normal limits.  A full examination was performed including scalp, head, eyes, ears, nose, lips, neck, chest, axillae, abdomen, back, buttocks, bilateral upper extremities, bilateral lower extremities, hands, feet, fingers, toes, fingernails, and toenails. All findings within normal limits unless otherwise noted below.   Relevant physical exam findings are noted in the Assessment and Plan.  R lower leg and Mid back Healing biopsy sites R dorsal hand x4, L superior helix x2, L dorsal hand x5, L mid forehead x1 (12) Pink scaly macules R forearm x5, L forearm x2, back x1 (8) Stuck on waxy paps with erythema  Assessment & Plan   SKIN CANCER SCREENING PERFORMED TODAY.  ACTINIC DAMAGE - Chronic condition, secondary to cumulative UV/sun exposure - diffuse scaly erythematous macules with underlying dyspigmentation - Recommend daily broad spectrum sunscreen SPF 30+ to sun-exposed areas, reapply every 2 hours as needed.  - Staying in the shade or wearing long sleeves, sun glasses (UVA+UVB protection) and wide brim hats (4-inch brim around the entire circumference of the hat) are also recommended for sun protection.  - Call for new  or changing lesions.  LENTIGINES, SEBORRHEIC KERATOSES, HEMANGIOMAS - Benign normal skin lesions - Benign-appearing - Call for any changes  MELANOCYTIC NEVI - Tan-brown and/or pink-flesh-colored symmetric macules and papules - Benign appearing on exam today - Observation - Call clinic for new or changing moles - Recommend daily use of broad spectrum spf 30+ sunscreen to sun-exposed areas.   L helix-if not resolved in 1 month, patiet will call to let us know consider biposy    BASAL CELL CARCINOMA (BCC) OF SKIN OF RIGHT LOWER EXTREMITY INCLUDING HIP R lower leg and Mid back Biopsy proven BCCs, MOHs pending AK (ACTINIC KERATOSIS) (12) R dorsal hand x4, L superior helix x2, L dorsal hand x5, L mid forehead x1 (12) Actinic keratoses are precancerous spots that appear secondary to cumulative UV radiation exposure/sun exposure over time. They are chronic with expected duration over 1 year. A portion of actinic keratoses will progress to squamous cell carcinoma of the skin. It is not possible to reliably predict which spots will progress to skin cancer and so treatment is recommended to prevent development of skin cancer.  Recommend daily broad spectrum sunscreen SPF 30+ to sun-exposed areas, reapply every 2 hours as needed.  Recommend staying in the shade or wearing long sleeves, sun glasses (UVA+UVB protection) and wide brim hats (4-inch brim around the entire circumference of the hat). Call for new or changing lesions. Destruction of lesion - R dorsal hand x4, L superior helix x2, L dorsal hand x5, L mid forehead x1 (12) Complexity: simple   Destruction method: cryotherapy   Informed consent: discussed and consent obtained  Timeout:  patient name, date of birth, surgical site, and procedure verified Lesion destroyed using liquid nitrogen: Yes   Region frozen until ice ball extended beyond lesion: Yes   Cryo cycles: 1 or 2. Outcome: patient tolerated procedure well with no complications    Post-procedure details: wound care instructions given   INFLAMED SEBORRHEIC KERATOSIS (8) R forearm x5, L forearm x2, back x1 (8) Symptomatic, irritating, patient would like treated. Destruction of lesion - R forearm x5, L forearm x2, back x1 (8) Complexity: simple   Destruction method: cryotherapy   Informed consent: discussed and consent obtained   Timeout:  patient name, date of birth, surgical site, and procedure verified Lesion destroyed using liquid nitrogen: Yes   Region frozen until ice ball extended beyond lesion: Yes   Cryo cycles: 1 or 2. Outcome: patient tolerated procedure well with no complications   Post-procedure details: wound care instructions given   MULTIPLE BENIGN NEVI   ACTINIC ELASTOSIS   SEBORRHEIC KERATOSES   CHERRY ANGIOMA   LENTIGINES      Return in about 6 months (around 01/07/2024) for TBSE, Hx BCC, w/ Dr. Katrinka Blazing.  Wynonia Lawman, CMA, am acting as scribe for Elie Goody, MD .   Documentation: I have reviewed the above documentation for accuracy and completeness, and I agree with the above.  Elie Goody, MD

## 2023-07-07 NOTE — Patient Instructions (Addendum)

## 2023-07-13 ENCOUNTER — Encounter: Payer: Self-pay | Admitting: Dermatology

## 2023-07-14 ENCOUNTER — Ambulatory Visit: Payer: PPO | Admitting: Dermatology

## 2023-07-14 ENCOUNTER — Encounter: Payer: Self-pay | Admitting: Dermatology

## 2023-07-14 VITALS — BP 136/72 | HR 80 | Temp 98.1°F

## 2023-07-14 DIAGNOSIS — L814 Other melanin hyperpigmentation: Secondary | ICD-10-CM

## 2023-07-14 DIAGNOSIS — C44712 Basal cell carcinoma of skin of right lower limb, including hip: Secondary | ICD-10-CM | POA: Diagnosis not present

## 2023-07-14 DIAGNOSIS — C44519 Basal cell carcinoma of skin of other part of trunk: Secondary | ICD-10-CM

## 2023-07-14 DIAGNOSIS — L579 Skin changes due to chronic exposure to nonionizing radiation, unspecified: Secondary | ICD-10-CM

## 2023-07-14 DIAGNOSIS — C4491 Basal cell carcinoma of skin, unspecified: Secondary | ICD-10-CM

## 2023-07-14 MED ORDER — OXYCODONE HCL 5 MG PO TABS
5.0000 mg | ORAL_TABLET | Freq: Four times a day (QID) | ORAL | 0 refills | Status: DC | PRN
Start: 2023-07-14 — End: 2024-03-04

## 2023-07-14 MED ORDER — MUPIROCIN 2 % EX OINT: 1.0000 | TOPICAL_OINTMENT | Freq: Two times a day (BID) | CUTANEOUS | 2 refills | Status: DC

## 2023-07-14 NOTE — Progress Notes (Signed)
 Follow-Up Visit   Subjective  Jason Lambert is a 89 y.o. male who presents for the following: Mohs of a Nodular and Infiltrative BCC on the right calf and and a Nodular and Superficial BCC on the mid back, referred by Dr. Katrinka Blazing, accompanied by his wife.  The following portions of the chart were reviewed this encounter and updated as appropriate: medications, allergies, medical history  Review of Systems:  No other skin or systemic complaints except as noted in HPI or Assessment and Plan.  Objective  Well appearing patient in no apparent distress; mood and affect are within normal limits.  A focused examination was performed of the following areas: Right calf and of mid back Relevant physical exam findings are noted in the Assessment and Plan.      Assessment & Plan   BASAL CELL CARCINOMA (BCC), UNSPECIFIED SITE (2) right calf Mohs surgery  Consent obtained: written  Anticoagulation: Is the patient taking prescription anticoagulant and/or aspirin prescribed/recommended by a physician? Yes   Was the anticoagulation regimen changed prior to Mohs? No    Procedure Details: Timeout: pre-procedure verification complete Procedure Prep: patient was prepped and draped in usual sterile fashion Prep type: chlorhexidine Pre-Op diagnosis: basal cell carcinoma BCC subtype: nodular and infiltrative MohsAIQ Surgical site (if tumor spans multiple areas, please select predominant area): lower limb (including hip) Surgery side: right Surgical site (from skin exam): right calf Pre-operative length (cm): 1.8 Pre-operative width (cm): 1.4 Indications for Mohs surgery: anatomic location where tissue conservation is critical and ill-defined borders  Micrographic Surgery Details: Post-operative length (cm): 2.5 Post-operative width (cm): 2.4 Number of Mohs stages: 1 Mid Back Mohs surgery  Consent obtained: written  Anticoagulation: Is the patient taking prescription anticoagulant  and/or aspirin prescribed/recommended by a physician? Yes   Was the anticoagulation regimen changed prior to Mohs? No    Procedure Details: Timeout: pre-procedure verification complete Procedure Prep: patient was prepped and draped in usual sterile fashion Prep type: isopropyl alcohol Pre-Op diagnosis: basal cell carcinoma BCC subtype: superficial and nodular MohsAIQ Surgical site (if tumor spans multiple areas, please select predominant area): trunk (excluding nipple/areola) Surgical site (from skin exam): Mid Back Pre-operative length (cm): 2.5 Pre-operative width (cm): 2.2 Indications for Mohs surgery: anatomic location where tissue conservation is critical and tumor size greater than 2 cm  Micrographic Surgery Details: Post-operative length (cm): 3.8 Post-operative width (cm): 4.1 Number of Mohs stages: 1  Skin repair Complexity:  Complex Final length (cm):  9 Informed consent: discussed and consent obtained   Timeout: patient name, date of birth, surgical site, and procedure verified   Procedure prep:  Patient was prepped and draped in usual sterile fashion Prep type:  Chlorhexidine Anesthesia: the lesion was anesthetized in a standard fashion   Anesthetic:  1% lidocaine w/ epinephrine 1-100,000 buffered w/ 8.4% NaHCO3 Reason for type of repair: reduce tension to allow closure, allow closure of the large defect, avoid adjacent structures, allow side-to-side closure without requiring a flap or graft and compensate for the inelasticity of skin in this area   Undermining: area extensively undermined   Subcutaneous layers (deep stitches):  Suture size:  3-0 (with dermabond and steri strips) Suture type: PDS (polydioxanone)   Stitches:  Buried vertical mattress Fine/surface layer approximation (top stitches):  Suture type: cyanoacrylate tissue glue   Hemostasis achieved with: suture, pressure and electrodesiccation Outcome: patient tolerated procedure well with no complications    Post-procedure details: sterile dressing applied and wound care instructions given  Dressing type: bandage and pressure dressing   Related Medications oxyCODONE (OXY IR/ROXICODONE) 5 MG immediate release tablet Take 1 tablet (5 mg total) by mouth every 6 (six) hours as needed for up to 8 doses for severe pain (pain score 7-10).   Return in about 4 weeks (around 08/11/2023) for wound check.  Owens Shark, CMA, am acting as scribe for Gwenith Daily, MD.    07/14/2023  HISTORY OF PRESENT ILLNESS  Jason Lambert is seen in consultation at the request of Dr. Katrinka Blazing for biopsy-proven Nodular Infiltrative BCC on the right calf and Superficial Nodular BCC on the mid back. They note that the area has been present for about 2 years increasing in size with time.  There is no history of previous treatment.  Reports no other new or changing lesions and has no other complaints today.  Medications and allergies: see patient chart.  Review of systems: Reviewed 8 systems and notable for the above skin cancer.  All other systems reviewed are unremarkable/negative, unless noted in the HPI. Past medical history, surgical history, family history, social history were also reviewed and are noted in the chart/questionnaire.    PHYSICAL EXAMINATION  General: Well-appearing, in no acute distress, alert and oriented x 4. Vitals reviewed in chart (if available).   Skin: Exam reveals a 1.8 x 1.4 cm erythematous papule and biopsy scar on the right calf and a 2.5 x 2.2 cm erythematous papule and biopsy scar on the mid back. There are rhytids, telangiectasias, and lentigines, consistent with photodamage.   Biopsy report(s) reviewed, confirming the diagnosis.   ASSESSMENT  1a) Nodular and Infiltrative Basal Cell Carcinoma on the right calf 1b) Superficial and Nodular Basal Cell Carcinoma on the mid back 2) photodamage 3) solar lentigines   PLAN   1. Due to location, size, histology, or recurrence and the  likelihood of subclinical extension as well as the need to conserve normal surrounding tissue, the patient was deemed acceptable for Mohs micrographic surgery (MMS).  The nature and purpose of the procedure, associated benefits and risks including recurrence and scarring, possible complications such as pain, infection, and bleeding, and alternative methods of treatment if appropriate were discussed with the patient during consent. The lesion location was verified by the patient, by reviewing previous notes, pathology reports, and by photographs as well as angulation measurements if available.  Informed consent was reviewed and signed by the patient, and timeout was performed at 8:30 AM. See op note below.  2. For the photodamage and solar lentigines, sun protection discussed/information given on OTC sunscreens, and we recommend continued regular follow-up with primary dermatologist every 6 months or sooner for any growing, bleeding, or changing lesions. 3. Prognosis and future surveillance discussed. 4. Letter with treatment outcome sent to referring provider. 5. Pain acetaminophen/ibuprofen/oxycodone 5 mg   MOHS MICROGRAPHIC SURGERY AND RECONSTRUCTION  Right Calf Initial size:   1.8 x 1.4 cm Surgical defect/wound size: 2.5 x 2.4 cm Anesthesia:    0.33% lidocaine with 1:200,000 epinephrine EBL:    <5 mL Complications:  None Repair type:   Secondary Intention   Stages: 1  STAGE I: Anesthesia achieved with 0.5% lidocaine with 1:200,000 epinephrine. ChloraPrep applied. 2 section(s) excised using Mohs technique (this includes total peripheral and deep tissue margin excision and evaluation with frozen sections, excised and interpreted by the same physician). The tumor was first debulked and then excised with an approx. 2mm margin.  Hemostasis was achieved with electrocautery as needed.  The specimen  was then oriented, subdivided/relaxed, inked, and processed using Mohs technique.    Frozen section  analysis revealed a clear deep and peripheral margin.  Reconstruction  Patient was notified of results and repair options were discussed, including second intention healing. After reviewing the advantages and disadvantages of each, we agreed on second intention healing as appropriate.   The surgical site was then lightly scrubbed with sterile, saline-soaked gauze.  The area was bandaged using Vaseline ointment, non-adherent gauze, gauze pads, and tape to provide an adequate pressure dressing.   The patient tolerated the procedure well, was given detailed written and verbal wound care instructions, and was discharged in good condition.  The patient will follow-up in 4 weeks and as scheduled with primary dermatologist.  Mid Back Initial size:   2.5 x 2.2 cm Surgical defect/wound size: 3.8 x 4.1 cm Anesthesia:    0.33% lidocaine with 1:200,000 epinephrine EBL:    <5 mL Complications:  None Repair type:   Complex SQ suture:   3-0 PDS Cutaneous suture:  Dermabond and Steri Strips Final size of the repair: 9.0 cm  Stages: 1  STAGE I: Anesthesia achieved with 0.5% lidocaine with 1:200,000 epinephrine. ChloraPrep applied. 2 section(s) excised using Mohs technique (this includes total peripheral and deep tissue margin excision and evaluation with frozen sections, excised and interpreted by the same physician). The tumor was first debulked and then excised with an approx. 2mm margin.  Hemostasis was achieved with electrocautery as needed.  The specimen was then oriented, subdivided/relaxed, inked, and processed using Mohs technique.    Frozen section analysis revealed a clear deep and peripheral margin.  Reconstruction  The surgical wound was then cleaned, prepped, and re-anesthetized as above. Wound edges were undermined extensively along at least one entire edge and at a distance equal to or greater than the width of the defect (see wound defect size above) in order to achieve closure and decrease  wound tension and anatomic distortion. Redundant tissue repair including standing cone removal was performed. Hemostasis was achieved with electrocautery. Subcutaneous and epidermal tissues were approximated with the above sutures. The surgical site was then lightly scrubbed with sterile, saline-soaked gauze. Steri-strips were applied, and the area was then bandaged using Vaseline ointment, non-adherent gauze, gauze pads, and tape to provide an adequate pressure dressing. The patient tolerated the procedure well, was given detailed written and verbal wound care instructions, and was discharged in good condition.   The patient will follow-up: 4 weeks.   Documentation: I have reviewed the above documentation for accuracy and completeness, and I agree with the above.  Gwenith Daily, MD

## 2023-07-14 NOTE — Patient Instructions (Signed)

## 2023-07-16 ENCOUNTER — Encounter: Payer: Self-pay | Admitting: Dermatology

## 2023-07-21 ENCOUNTER — Encounter: Payer: Self-pay | Admitting: Internal Medicine

## 2023-08-06 NOTE — Addendum Note (Signed)
 Addended by: Elease Etienne A on: 08/06/2023 11:02 AM   Modules accepted: Orders

## 2023-08-06 NOTE — Progress Notes (Signed)
 Remote pacemaker transmission.

## 2023-08-09 ENCOUNTER — Other Ambulatory Visit (HOSPITAL_COMMUNITY): Payer: Self-pay

## 2023-08-10 ENCOUNTER — Other Ambulatory Visit (HOSPITAL_COMMUNITY): Payer: Self-pay

## 2023-08-13 ENCOUNTER — Ambulatory Visit: Admitting: Dermatology

## 2023-08-13 ENCOUNTER — Encounter: Payer: Self-pay | Admitting: Dermatology

## 2023-08-13 VITALS — BP 149/75

## 2023-08-13 DIAGNOSIS — Z85828 Personal history of other malignant neoplasm of skin: Secondary | ICD-10-CM

## 2023-08-13 DIAGNOSIS — L929 Granulomatous disorder of the skin and subcutaneous tissue, unspecified: Secondary | ICD-10-CM | POA: Diagnosis not present

## 2023-08-13 DIAGNOSIS — L905 Scar conditions and fibrosis of skin: Secondary | ICD-10-CM

## 2023-08-13 DIAGNOSIS — S81801A Unspecified open wound, right lower leg, initial encounter: Secondary | ICD-10-CM | POA: Diagnosis not present

## 2023-08-13 DIAGNOSIS — C4491 Basal cell carcinoma of skin, unspecified: Secondary | ICD-10-CM

## 2023-08-13 DIAGNOSIS — T1490XD Injury, unspecified, subsequent encounter: Secondary | ICD-10-CM

## 2023-08-13 DIAGNOSIS — C44712 Basal cell carcinoma of skin of right lower limb, including hip: Secondary | ICD-10-CM

## 2023-08-13 NOTE — Patient Instructions (Signed)

## 2023-08-13 NOTE — Progress Notes (Signed)
   Follow-Up Visit   Subjective  Jason Lambert is a 89 y.o. male who presents for the following: Mohs follow up - BCC of right calf and mid back - Mohs 07/14/2023. He is healing well. He states that he has been applying ointment to the wound on his right calf.  The following portions of the chart were reviewed this encounter and updated as appropriate: medications, allergies, medical history  Review of Systems:  No other skin or systemic complaints except as noted in HPI or Assessment and Plan.  Objective  Well appearing patient in no apparent distress; mood and affect are within normal limits.   A focused examination was performed of the following areas: Back, right leg  Relevant exam findings are noted in the Assessment and Plan.      Assessment & Plan   Scar s/p Mohs for [facility] on mid back, treated on 07/14/2023, repaired with linear closure - Reassured that wound has healed well - Discussed that scars take up to 12 months to mature from the date of surgery - Recommend SPF 30+ to scar daily to prevent purple color - OK to start scar massage at 4-6 weeks post-op - Can consider silicone based products for scar healing  Healing wound s/p Mohs for [facility] on right lower leg, treated on 07/14/2023, repaired with linear closure - Reassured that wound has healed well - Discussed that scars take up to 12 months to mature from the date of surgery - Recommend SPF 30+ to scar daily to prevent purple color - OK to start scar massage at 4-6 weeks post-op - Can consider silicone based products for scar healing - Continue ointment daily  HISTORY OF BASAL CELL CARCINOMA OF MID BACK - No evidence of recurrence today - Recommend regular full body skin exams - Recommend daily broad spectrum sunscreen SPF 30+ to sun-exposed areas, reapply every 2 hours as needed.  - Call if any new or changing lesions are noted between office visits  HISTORY OF BASAL CELL CARCINOMA OF RIGHT CALF WITH PROUD  FLESH - No evidence of recurrence today. Silver Nitrate applied today. - Recommend regular full body skin exams - Recommend daily broad spectrum sunscreen SPF 30+ to sun-exposed areas, reapply every 2 hours as needed.  - Call if any new or changing lesions are noted between office visits  Proud Flesh- Right lower leg- Acute not at treatment goal The patient was evaluated for delayed wound healing with evidence of excessive granulation tissue, consistent with proud flesh. I explained that this is a benign but overactive healing response, where the granulation tissue extends beyond the wound edges, potentially interfering with proper re-epithelialization. We discussed that while this is not dangerous, it can delay closure and may require minor intervention. Treatment options include chemical cauterization with silver nitrate to reduce the excess tissue, as well as continued local wound care with non-adherent dressings and moisture balance. The importance of avoiding trauma or friction to the area was also reviewed. The patient was counseled on signs of infection and when to return for reassessment. All questions were addressed, and the patient expressed understanding of the diagnosis and plan.   Return in about 2 weeks (around 08/27/2023).  I, Joanie Coddington, CMA, am acting as scribe for Gwenith Daily, MD .   Documentation: I have reviewed the above documentation for accuracy and completeness, and I agree with the above.  Gwenith Daily, MD

## 2023-08-14 ENCOUNTER — Encounter: Payer: PPO | Admitting: Internal Medicine

## 2023-08-26 ENCOUNTER — Ambulatory Visit: Admitting: Dermatology

## 2023-08-27 ENCOUNTER — Other Ambulatory Visit: Payer: Self-pay

## 2023-08-27 ENCOUNTER — Ambulatory Visit: Admitting: Dermatology

## 2023-08-27 ENCOUNTER — Encounter: Payer: Self-pay | Admitting: Dermatology

## 2023-08-27 VITALS — BP 161/69 | HR 70

## 2023-08-27 DIAGNOSIS — L570 Actinic keratosis: Secondary | ICD-10-CM

## 2023-08-27 DIAGNOSIS — L929 Granulomatous disorder of the skin and subcutaneous tissue, unspecified: Secondary | ICD-10-CM | POA: Diagnosis not present

## 2023-08-27 DIAGNOSIS — S81801A Unspecified open wound, right lower leg, initial encounter: Secondary | ICD-10-CM | POA: Diagnosis not present

## 2023-08-27 DIAGNOSIS — L539 Erythematous condition, unspecified: Secondary | ICD-10-CM

## 2023-08-27 DIAGNOSIS — T1490XD Injury, unspecified, subsequent encounter: Secondary | ICD-10-CM

## 2023-08-27 DIAGNOSIS — Z85828 Personal history of other malignant neoplasm of skin: Secondary | ICD-10-CM | POA: Diagnosis not present

## 2023-08-27 DIAGNOSIS — C4491 Basal cell carcinoma of skin, unspecified: Secondary | ICD-10-CM

## 2023-08-27 NOTE — Patient Instructions (Signed)

## 2023-08-27 NOTE — Progress Notes (Addendum)
 Follow Up Visit   Subjective  Jason Lambert is a 89 y.o. male who presents for the following: follow up from Mohs surgery   The patient presents for follow up from Mohs surgery for a BCC on the mid back and right lower leg, treated on 07/14/23, repaired with linear closure. The patient has been bandaging the wound as directed. The endorse the following concerns: No questions or concerns at this time.  The following portions of the chart were reviewed this encounter and updated as appropriate: medications, allergies, medical history  Review of Systems:  No other skin or systemic complaints except as noted in HPI or Assessment and Plan.  Objective  Well appearing patient in no apparent distress; mood and affect are within normal limits.  A full examination was performed including scalp, head, face, back and right lower leg. All findings within normal limits unless otherwise noted below.  Healing wound with mild erythema  Relevant physical exam findings are noted in the Assessment and Plan.  Left Buccal Cheek Erythematous thin papules/macules with gritty scale.   Assessment & Plan   Healing s/p Mohs for [facility], treated on 07/14/23, repaired with linear closure. - Reassured that wound is healing well - No evidence of infection - No swelling, induration, purulence, dehiscence, or tenderness out of proportion to the clinical exam, see photo above - Discussed that scars take up to 12 months to mature from the date of surgery - Recommend SPF 30+ to scar daily to prevent purple color from UV exposure during scar maturation process - Discussed that erythema and raised appearance of scar will fade over the next 4-6 months - OK to start scar massage at 4-6 weeks post-op - Can consider silicone based products for scar healing starting at 6 weeks post-op - Ok to continue ointment daily to wound under a bandage for another 1 week or until completley healed.  HISTORY OF BASAL CELL CARCINOMA OF  MID BACK - No evidence of recurrence today - Recommend regular full body skin exams - Recommend daily broad spectrum sunscreen SPF 30+ to sun-exposed areas, reapply every 2 hours as needed.  - Call if any new or changing lesions are noted between office visits  HISTORY OF BASAL CELL CARCINOMA OF RIGHT CALF WITH PROUD FLESH - No evidence of recurrence today. Silver Nitrate applied today. - Recommend regular full body skin exams - Recommend daily broad spectrum sunscreen SPF 30+ to sun-exposed areas, reapply every 2 hours as needed.  - Call if any new or changing lesions are noted between office visits   Proud Flesh- Right lower leg- Acute not at treatment goal The patient was evaluated for delayed wound healing with evidence of excessive granulation tissue, consistent with proud flesh. I explained that this is a benign but overactive healing response, where the granulation tissue extends beyond the wound edges, potentially interfering with proper re-epithelialization. We discussed that while this is not dangerous, it can delay closure and may require minor intervention. Treatment options include chemical cauterization with silver nitrate to reduce the excess tissue, as well as continued local wound care with non-adherent dressings and moisture balance. The importance of avoiding trauma or friction to the area was also reviewed. The patient was counseled on signs of infection and when to return for reassessment. All questions were addressed, and the patient expressed understanding of the diagnosis and plan.  Procedure Note: After informed consent was obtained, the area was cleansed with normal saline. Excess granulation tissue was identified. A  silver nitrate applicator stick was applied directly to the hypergranulated tissue with light pressure until blanching was observed. The area was then gently irrigated with saline to remove excess chemical residue. No local anesthetic was required. The patient  tolerated the procedure well without complication.  Post-Procedure Care: The area was covered with a non-adherent dressing. The patient was advised to monitor for signs of infection and to keep the area clean and dry. Follow-up in [#] days for reassessment and possible repeat treatment.  Complications: None  Tolerance: Well tolerated  AK (ACTINIC KERATOSIS) Left Buccal Cheek Destruction of lesion - Left Buccal Cheek Complexity: extensive   Destruction method: cryotherapy   Informed consent: discussed and consent obtained   Timeout:  patient name, date of birth, surgical site, and procedure verified Lesion destroyed using liquid nitrogen: Yes   Region frozen until ice ball extended beyond lesion: Yes   Cryotherapy cycles:  2 Outcome: patient tolerated procedure well with no complications   Post-procedure details: wound care instructions given   PROUD FLESH   HEALING WOUND   BASAL CELL CARCINOMA (BCC), UNSPECIFIED SITE   Related Medications oxyCODONE  (OXY IR/ROXICODONE ) 5 MG immediate release tablet Take 1 tablet (5 mg total) by mouth every 6 (six) hours as needed for up to 8 doses for severe pain (pain score 7-10).  Return if symptoms worsen or fail to improve.  I, Haig Levan, Surg Tech III, am acting as scribe for Deneise Finlay, MD.   Documentation: I have reviewed the above documentation for accuracy and completeness, and I agree with the above.  Deneise Finlay, MD

## 2023-09-02 ENCOUNTER — Encounter: Payer: Self-pay | Admitting: Student

## 2023-09-02 ENCOUNTER — Ambulatory Visit: Payer: PPO | Admitting: Student

## 2023-09-02 VITALS — BP 126/76 | HR 68 | Temp 97.9°F | Ht 65.0 in | Wt 167.0 lb

## 2023-09-02 DIAGNOSIS — Z85828 Personal history of other malignant neoplasm of skin: Secondary | ICD-10-CM | POA: Diagnosis not present

## 2023-09-02 DIAGNOSIS — D649 Anemia, unspecified: Secondary | ICD-10-CM | POA: Diagnosis not present

## 2023-09-02 DIAGNOSIS — M25511 Pain in right shoulder: Secondary | ICD-10-CM | POA: Diagnosis not present

## 2023-09-02 NOTE — Progress Notes (Signed)
 Location:  [facility]: [facility] Provider: Jann Melody  Code Status: Full Code Goals of Care:     09/02/2023    2:27 PM  Advanced Directives  Does Patient Have a Medical Advance Directive? Yes  Type of Estate agent of [place];Living will  Does patient want to make changes to medical advance directive? No - Patient declined  Copy of Healthcare Power of Attorney in Chart? No - copy requested     Chief Complaint  Patient presents with   Medical Management of Chronic Issues    Medical Management of Chronic Issues.     HPI: Patient is a 89 y.o. male seen today for medical management of chronic diseases.   Discussed the use of AI scribe software for clinical note transcription with the patient, who gave verbal consent to proceed.  History of Present Illness   Jason Lambert is an 89 year old male who presents for follow-up after recent procedures on his leg and back.  He underwent procedures on his leg and back, which he describes as successful. The leg procedure involved a wound that was two and a half centimeters in size, which remains open but is closing. He is managing wound care independently, wrapping the wound and changing the dressing daily. He has been attending follow-up appointments to monitor for recurrence but has been discharged from further follow-up. Regarding his back, the area was 'pulled together' resulting in a long scar. There was insufficient skin to close the leg wound, so it is healing by secondary intention.  He experiences pain in his arm, which he attributes to overuse, possibly from using a computer mouse. He has not attempted any exercises to alleviate this pain. He experiences pain when lifting his arm to the side and notes a 'crunch, crunch, crunch' sensation during sleep.  He has a pacemaker that is automatically monitored by a device by his bed, requiring no active management from him.  He previously underwent an anemia  workup, which did not reveal a clear cause. No symptoms related to anemia or other conditions, aside from age-related issues. He is due for follow-up labs soon to monitor his condition.       Past Medical History:  Diagnosis Date   Adenocarcinoma (HCC)    Prostate   Allergic rhinitis    Atrial fibrillation (HCC)    BCC (basal cell carcinoma of skin) 06/15/2023   R lower leg, MOHs pending   BCC (basal cell carcinoma of skin) 06/23/2023   mid back, Mohs referral sent   Grave's disease    Hyperlipidemia    Hypertension    Prostate cancer (HCC)    Renal insufficiency    sinus node dysfunction    s/p BTK pacemaker    Past Surgical History:  Procedure Laterality Date   CATARACT EXTRACTION W/ INTRAOCULAR LENS  IMPLANT, BILATERAL  2012   PACEMAKER GENERATOR CHANGE N/A 05/02/2013   Procedure: PACEMAKER GENERATOR CHANGE;  Surgeon: Verona Goodwill, MD;  Location: [facility] CATH LAB;  Service: Cardiovascular;  Laterality: N/A;   PACEMAKER INSERTION  8/06; 05/02/2013   Bradycardia/ syncope; BTK with generator change 05/02/2013 by Dr Rodolfo Clan   Prostate Cancer  2007   RT   TONSILLECTOMY  1947   adenoids    No Known Allergies  Outpatient Encounter Medications as of 09/02/2023  Medication Sig   acetaminophen  (TYLENOL ) 325 MG tablet Take 650 mg by mouth at bedtime.   apixaban  (ELIQUIS ) 2.5 MG TABS tablet Take  1 tablet (2.5 mg total) by mouth 2 (two) times daily.   atorvastatin  (LIPITOR) 10 MG tablet Take 1 tablet (10 mg total) by mouth daily.   cholecalciferol (VITAMIN D3) 25 MCG (1000 UNIT) tablet Take 1,000 Units by mouth daily. Per patient taking 800 units daily   hydrochlorothiazide  (HYDRODIURIL ) 25 MG tablet Take 1 tablet (25 mg total) by mouth daily.   loratadine (CLARITIN) 10 MG tablet Take 10 mg by mouth daily as needed.   metoprolol  succinate (TOPROL -XL) 100 MG 24 hr tablet Take 1 tablet (100 mg total) by mouth daily. Take with or immediately following a meal.   Multiple Vitamins-Minerals  (CENTRUM SILVER PO) Take 1 tablet by mouth daily.   mupirocin  ointment (BACTROBAN ) 2 % Apply 1 Application topically 2 (two) times daily.   oxyCODONE  (OXY IR/ROXICODONE ) 5 MG immediate release tablet Take 1 tablet (5 mg total) by mouth every 6 (six) hours as needed for up to 8 doses for severe pain (pain score 7-10).   triamcinolone  cream (KENALOG ) 0.1 % Apply 1 Application topically 2 (two) times daily.   No facility-administered encounter medications on file as of 09/02/2023.    Review of Systems:  Review of Systems  Health Maintenance  Topic Date Due   Medicare Annual Wellness (AWV)  08/12/2023   COVID-19 Vaccine [phone] - 2024-25 season) 10/09/2023   INFLUENZA VACCINE  12/04/2023   DTaP/Tdap/Td (4 - Td or Tdap) 04/12/2024   Pneumonia Vaccine 15+ Years old  Completed   Zoster Vaccines- Shingrix  Completed   HPV VACCINES  Aged Out   Meningococcal B Vaccine  Aged Out    Physical Exam: Vitals:   09/02/23 1424  BP: 126/76  Pulse: 68  Temp: 97.9 F (36.6 C)  SpO2: 98%  Weight: 167 lb (75.8 kg)  Height: 5\' 5"  (1.651 m)   Body mass index is 27.79 kg/m. Physical Exam Physical Exam   CHEST: Lungs clear to auscultation. MUSCULOSKELETAL: Shoulder exam normal.      Labs reviewed: Basic Metabolic Panel: Recent Labs    06/04/23 0747  NA 136  K 3.4*  CL 96*  CO2 30  GLUCOSE 141*  BUN 29*  CREATININE 1.77*  CALCIUM  8.9  8.9  TSH 3.64   Liver Function Tests: Recent Labs    06/04/23 0747  AST 14  ALT 14  BILITOT 0.7  PROT 6.9   No results for input(s): "LIPASE", "AMYLASE" in the last 8760 hours. No results for input(s): "AMMONIA" in the last 8760 hours. CBC: Recent Labs    05/05/23 1121 06/04/23 0747 06/08/23 0743  WBC 7.0 5.0 5.9  NEUTROABS  --  2,170 2,702  HGB 11.1* 10.6* 11.1*  HCT 32.6* 30.5* 32.6*  MCV 102* 98.4 101.9*  PLT 231 205 233   Lipid Panel: Recent Labs    06/04/23 0747  CHOL 128  HDL 35*  LDLCALC 64  TRIG 829*  CHOLHDL 3.7   Lab  Results  Component Value Date   HGBA1C 6.7 (H) 06/08/2023    Procedures since last visit: No results found. Results          Assessment/Plan     Limited mobility in right shoulder Limited mobility with pain during certain movements, particularly when lifting. No tenderness or muscle atrophy. Possible weakness in posterior shoulder muscles. Overall normal shoulder exam. - Recommend shoulder-specific exercises to strengthen muscles and improve mobility.  Hx of basal cell carcinoma s/p Mohs Open wound on leg, initially 2.5 cm, still open but healing. Performing self-wound  care with daily dressing changes. Discharged from specialist care, no current follow-up planned. - Continue self-wound care with daily dressing changes.  Anemia Anemia with no current symptoms. Previous workup inconclusive with no clear cause identified. Monitoring for stability is planned. - Order labs to monitor anemia stability in the next week.       Labs/tests ordered:  * No order type specified * Next appt:  12/02/2023   I spent greater than 30 minutes for the care of this patient in face to face time, chart review, clinical documentation, patient education.

## 2023-09-02 NOTE — Patient Instructions (Signed)
 VISIT SUMMARY:  You came in today for a follow-up after recent procedures on your leg and back. Your leg wound is healing, and you are managing the wound care on your own. You also mentioned experiencing pain in your arm, which you think might be due to overuse. Additionally, we discussed your anemia and the need for follow-up labs.  YOUR PLAN:  -LIMITED MOBILITY IN RIGHT SHOULDER: You have limited mobility and pain in your right shoulder, especially when lifting your arm. This could be due to weakness in the shoulder muscles. We recommend doing shoulder-specific exercises to strengthen these muscles and improve your mobility.  -OPEN WOUND ON LEG: You have an open wound on your leg that is healing but still requires daily care. Continue with your self-wound care routine, including daily dressing changes.  -ANEMIA: Anemia is a condition where you don't have enough healthy red blood cells to carry adequate oxygen to your body's tissues. Although you have no current symptoms, we need to monitor your condition. We will order labs to check your anemia status in the next week.  INSTRUCTIONS:  Please continue with your shoulder exercises and daily wound care. Schedule your follow-up labs for anemia monitoring within the next week.

## 2023-09-10 DIAGNOSIS — D649 Anemia, unspecified: Secondary | ICD-10-CM | POA: Diagnosis not present

## 2023-09-13 LAB — CBC WITH DIFFERENTIAL/PLATELET
Absolute Lymphocytes: 1505 {cells}/uL (ref 850–3900)
Absolute Monocytes: 1652 {cells}/uL — ABNORMAL HIGH (ref 200–950)
Basophils Absolute: 18 {cells}/uL (ref 0–200)
Basophils Relative: 0.3 %
Eosinophils Absolute: 30 {cells}/uL (ref 15–500)
Eosinophils Relative: 0.5 %
HCT: 30.9 % — ABNORMAL LOW (ref 38.5–50.0)
Hemoglobin: 10.6 g/dL — ABNORMAL LOW (ref 13.2–17.1)
MCH: 34 pg — ABNORMAL HIGH (ref 27.0–33.0)
MCHC: 34.3 g/dL (ref 32.0–36.0)
MCV: 99 fL (ref 80.0–100.0)
MPV: 11 fL (ref 7.5–12.5)
Monocytes Relative: 28 %
Neutro Abs: 2696 {cells}/uL (ref 1500–7800)
Neutrophils Relative %: 45.7 %
Platelets: 195 10*3/uL (ref 140–400)
RBC: 3.12 10*6/uL — ABNORMAL LOW (ref 4.20–5.80)
RDW: 12.1 % (ref 11.0–15.0)
Total Lymphocyte: 25.5 %
WBC: 5.9 10*3/uL (ref 3.8–10.8)

## 2023-09-13 LAB — ANA,IFA RA DIAG PNL W/RFLX TIT/PATN
Anti Nuclear Antibody (ANA): POSITIVE — AB
Cyclic Citrullin Peptide Ab: <16 U
Rheumatoid fact SerPl-aCnc: <10 [IU]/mL (ref <14)

## 2023-09-13 LAB — PROTEIN ELECTROPHORESIS, SERUM
Albumin ELP: 4 g/dL (ref 3.8–4.8)
Alpha 1: 0.3 g/dL (ref 0.2–0.3)
Alpha 2: 0.8 g/dL (ref 0.5–0.9)
Beta 2: 0.3 g/dL (ref 0.2–0.5)
Beta Globulin: 0.4 g/dL (ref 0.4–0.6)
Gamma Globulin: 0.9 g/dL (ref 0.8–1.7)
Total Protein: 6.8 g/dL (ref 6.1–8.1)

## 2023-09-13 LAB — ANTI-NUCLEAR AB-TITER (ANA TITER): ANA Titer 1: 1:80 {titer} — ABNORMAL HIGH

## 2023-09-19 ENCOUNTER — Ambulatory Visit: Payer: Self-pay | Admitting: Student

## 2023-09-19 DIAGNOSIS — R768 Other specified abnormal immunological findings in serum: Secondary | ICD-10-CM

## 2023-09-20 ENCOUNTER — Encounter: Payer: Self-pay | Admitting: Student

## 2023-09-20 DIAGNOSIS — D649 Anemia, unspecified: Secondary | ICD-10-CM

## 2023-09-20 DIAGNOSIS — R768 Other specified abnormal immunological findings in serum: Secondary | ICD-10-CM

## 2023-09-21 ENCOUNTER — Telehealth: Admitting: Student

## 2023-09-21 NOTE — Telephone Encounter (Signed)
 Contacted patient to discuss lab results. Discussed   When he gets overheated he feels a little lightheaded -- this has been an issue for ~20 years. He had an issue with feeling lightheaded when trimming some hedges at his home. Notified him that I have placed a referral for both hematology and rheumatology, however, I think heme-onc should be prioritized given non specific pattern on autoimmune tests. Discussed they may want to do repeat labs, a blood smear, as well as potentially a bone biopsy for diagnostics and he is fine with proceeding for workup.   All questions answered.   VB

## 2023-09-29 ENCOUNTER — Ambulatory Visit (INDEPENDENT_AMBULATORY_CARE_PROVIDER_SITE_OTHER): Payer: PPO

## 2023-09-29 DIAGNOSIS — I495 Sick sinus syndrome: Secondary | ICD-10-CM

## 2023-09-30 ENCOUNTER — Ambulatory Visit: Payer: Self-pay | Admitting: Cardiology

## 2023-09-30 ENCOUNTER — Inpatient Hospital Stay

## 2023-09-30 ENCOUNTER — Inpatient Hospital Stay: Attending: Oncology | Admitting: Oncology

## 2023-09-30 ENCOUNTER — Encounter: Payer: Self-pay | Admitting: Oncology

## 2023-09-30 VITALS — BP 154/67 | HR 73 | Temp 96.0°F | Resp 18 | Wt 167.3 lb

## 2023-09-30 DIAGNOSIS — D649 Anemia, unspecified: Secondary | ICD-10-CM | POA: Insufficient documentation

## 2023-09-30 DIAGNOSIS — Z85828 Personal history of other malignant neoplasm of skin: Secondary | ICD-10-CM | POA: Diagnosis not present

## 2023-09-30 DIAGNOSIS — R5383 Other fatigue: Secondary | ICD-10-CM | POA: Insufficient documentation

## 2023-09-30 DIAGNOSIS — D631 Anemia in chronic kidney disease: Secondary | ICD-10-CM | POA: Diagnosis not present

## 2023-09-30 DIAGNOSIS — R768 Other specified abnormal immunological findings in serum: Secondary | ICD-10-CM | POA: Insufficient documentation

## 2023-09-30 DIAGNOSIS — N1832 Chronic kidney disease, stage 3b: Secondary | ICD-10-CM | POA: Diagnosis not present

## 2023-09-30 DIAGNOSIS — D509 Iron deficiency anemia, unspecified: Secondary | ICD-10-CM

## 2023-09-30 LAB — CUP PACEART REMOTE DEVICE CHECK
Battery Voltage: 35
Date Time Interrogation Session: 20250527082720
Implantable Lead Connection Status: 753985
Implantable Lead Connection Status: 753985
Implantable Lead Implant Date: 20060801
Implantable Lead Implant Date: 20060801
Implantable Lead Location: 753859
Implantable Lead Location: 753860
Implantable Lead Model: 350
Implantable Lead Serial Number: 24319010
Implantable Lead Serial Number: 24332742
Implantable Pulse Generator Implant Date: 20141229
Pulse Gen Serial Number: 68145068

## 2023-09-30 NOTE — Assessment & Plan Note (Signed)
 Previous labs were reviewed and discussed with patient. Anemia is likely secondary to chronic kidney disease. Lab Results  Component Value Date   HGB 10.6 (L) 09/10/2023   TIBC 325 06/04/2023   IRONPCTSAT 41 06/04/2023   FERRITIN 34 06/04/2023    I recommend patient to take oral iron supplementation Vitron-C 1 tablet daily. Plan to repeat levels in 3 months before deciding whether he will need IV Venofer treatments.

## 2023-09-30 NOTE — Assessment & Plan Note (Signed)
 Patient was referred to rheumatology for evaluation by primary care provider.

## 2023-09-30 NOTE — Progress Notes (Signed)
 Hematology/Oncology Consult note [phone] [phone]        REFERRING PROVIDER: Valrie Gehrig, MD   CHIEF COMPLAINTS/REASON FOR VISIT:  Evaluation of anemia    ASSESSMENT & PLAN:   Anemia in chronic kidney disease (CKD) Previous labs were reviewed and discussed with patient. Anemia is likely secondary to chronic kidney disease. Lab Results  Component Value Date   HGB 10.6 (L) 09/10/2023   TIBC 325 06/04/2023   IRONPCTSAT 41 06/04/2023   FERRITIN 34 06/04/2023    I recommend patient to take oral iron supplementation Vitron-C 1 tablet daily. Plan to repeat levels in 3 months before deciding whether he will need IV Venofer treatments.  Stage 3b chronic kidney disease (HCC) Patient has had SPEP done which showed no M protein. Encourage oral hydration and avoid nephrotoxins.    Elevated antinuclear antibody (ANA) level Patient was referred to rheumatology for evaluation by primary care provider.   Orders Placed This Encounter  Procedures   CBC with Differential (Cancer Center Only)    Standing Status:   Future    Expected Date:   12/31/2023    Expiration Date:   09/29/2024   Iron and TIBC    Standing Status:   Future    Expected Date:   12/31/2023    Expiration Date:   09/29/2024   Ferritin    Standing Status:   Future    Expected Date:   12/31/2023    Expiration Date:   09/29/2024   Follow up in 3 months All questions were answered. The patient knows to call the clinic with any problems, questions or concerns.  Timmy Forbes, MD, PhD [facility] Hematology Oncology 09/30/2023   HISTORY OF PRESENTING ILLNESS:   Jason Lambert is a  89 y.o.  male with PMH listed below was seen in consultation at the request of  Valrie Gehrig, MD  for evaluation of anemia.   Patient has chronic history of anemia since at least 2020.  His baseline hemoglobin level has progressively decreased over the past few years and currently at 10.6 on 09/10/2023.  He  has chronic kidney disease, His previous workup showed normal iron, B12, and folate levels, although B12 was borderline. He also has elevated ANA levels and has been referred to a rheumatologist for further evaluation.  06/04/2023, iron saturation 41, ferritin 34.  Patient reports feeling tired sometimes.  No chest pain, shortness of breath.  Patient denies any blood in the stool or dark tarry stool.  No bowel habit changes.   MEDICAL HISTORY:  Past Medical History:  Diagnosis Date   Adenocarcinoma (HCC)    Prostate   Allergic rhinitis    Atrial fibrillation (HCC)    BCC (basal cell carcinoma of skin) 06/15/2023   R lower leg, MOHs pending   BCC (basal cell carcinoma of skin) 06/23/2023   mid back, Mohs referral sent   Grave's disease    Hyperlipidemia    Hypertension    Prostate cancer (HCC)    Renal insufficiency    sinus node dysfunction    s/p BTK pacemaker    SURGICAL HISTORY: Past Surgical History:  Procedure Laterality Date   CATARACT EXTRACTION W/ INTRAOCULAR LENS  IMPLANT, BILATERAL  2012   PACEMAKER GENERATOR CHANGE N/A 05/02/2013   Procedure: PACEMAKER GENERATOR CHANGE;  Surgeon: Verona Goodwill, MD;  Location: [facility] CATH LAB;  Service: Cardiovascular;  Laterality: N/A;   PACEMAKER INSERTION  8/06; 05/02/2013   Bradycardia/ syncope; BTK with generator change 05/02/2013  by Dr Rodolfo Clan   Prostate Cancer  2007   RT   TONSILLECTOMY  1947   adenoids    SOCIAL HISTORY: Social History   Socioeconomic History   Marital status: Married    Spouse name: Not on file   Number of children: 2   Years of education: Not on file   Highest education level: Bachelor's degree (e.g., BA, AB, BS)  Occupational History   Occupation: Retired    Comment: Acupuncturist with [employer]  Tobacco Use   Smoking status: Never    Passive exposure: Never   Smokeless tobacco: Never  Vaping Use   Vaping status: Never Used  Substance and Sexual Activity   Alcohol use: No    Comment: Occasional    Drug use: No   Sexual activity: Not on file  Other Topics Concern   Not on file  Social History Narrative   Has a living will.    Wife to make health care decisions (then daughter [name])--formal health care POA   Would accept resuscitation   Not sure about feeding tube but no "unusual attempts"   Donating body to [employer]      Social Drivers of Health   Financial Resource Strain: Low Risk  (05/30/2023)   Overall Financial Resource Strain (CARDIA)    Difficulty of Paying Living Expenses: Not very hard  Food Insecurity: No Food Insecurity (05/30/2023)   Hunger Vital Sign    Worried About Running Out of Food in the Last Year: Never true    Ran Out of Food in the Last Year: Never true  Transportation Needs: No Transportation Needs (05/30/2023)   PRAPARE - Administrator, Civil Service (Medical): No    Lack of Transportation (Non-Medical): No  Physical Activity: Sufficiently Active (05/30/2023)   Exercise Vital Sign    Days of Exercise per Week: 5 days    Minutes of Exercise per Session: 30 min  Stress: No Stress Concern Present (05/30/2023)   [employer] of Occupational Health - Occupational Stress Questionnaire    Feeling of Stress : Not at all  Social Connections: Moderately Integrated (05/30/2023)   Social Connection and Isolation Panel [NHANES]    Frequency of Communication with Friends and Family: Once a week    Frequency of Social Gatherings with Friends and Family: Once a week    Attends Religious Services: 1 to 4 times per year    Active Member of [employer] or Organizations: Yes    Attends Engineer, structural: More than 4 times per year    Marital Status: Married  Catering manager Violence: Not on file    FAMILY HISTORY: Family History  Problem Relation Age of Onset   Cancer Mother    Diabetes Mother    Coronary artery disease Father        cabg   Hypertension Neg Hx    Prostate cancer Neg Hx    Colon cancer Neg Hx     ALLERGIES:   has no known allergies.  MEDICATIONS:  Current Outpatient Medications  Medication Sig Dispense Refill   acetaminophen  (TYLENOL ) 325 MG tablet Take 650 mg by mouth at bedtime.     apixaban  (ELIQUIS ) 2.5 MG TABS tablet Take 1 tablet (2.5 mg total) by mouth 2 (two) times daily. 180 tablet 1   atorvastatin  (LIPITOR) 10 MG tablet Take 1 tablet (10 mg total) by mouth daily. 90 tablet 3   cholecalciferol (VITAMIN D3) 25 MCG (1000 UNIT) tablet Take 1,000 Units by  mouth daily. Per patient taking 800 units daily     hydrochlorothiazide  (HYDRODIURIL ) 25 MG tablet Take 1 tablet (25 mg total) by mouth daily. 90 tablet 3   loratadine (CLARITIN) 10 MG tablet Take 10 mg by mouth daily as needed.     metoprolol  succinate (TOPROL -XL) 100 MG 24 hr tablet Take 1 tablet (100 mg total) by mouth daily. Take with or immediately following a meal. 90 tablet 3   Multiple Vitamins-Minerals (CENTRUM SILVER PO) Take 1 tablet by mouth daily.     mupirocin  ointment (BACTROBAN ) 2 % Apply 1 Application topically 2 (two) times daily. 22 g 2   oxyCODONE  (OXY IR/ROXICODONE ) 5 MG immediate release tablet Take 1 tablet (5 mg total) by mouth every 6 (six) hours as needed for up to 8 doses for severe pain (pain score 7-10). 8 tablet 0   triamcinolone  cream (KENALOG ) 0.1 % Apply 1 Application topically 2 (two) times daily. 30 g 0   No current facility-administered medications for this visit.    Review of Systems  Constitutional:  Positive for fatigue. Negative for appetite change, chills, fever and unexpected weight change.  HENT:   Negative for hearing loss and voice change.   Eyes:  Negative for eye problems and icterus.  Respiratory:  Negative for chest tightness, cough and shortness of breath.   Cardiovascular:  Negative for chest pain and leg swelling.  Gastrointestinal:  Negative for abdominal distention and abdominal pain.  Endocrine: Negative for hot flashes.  Genitourinary:  Negative for difficulty urinating, dysuria and  frequency.   Musculoskeletal:  Negative for arthralgias.  Skin:  Negative for itching and rash.  Neurological:  Negative for light-headedness and numbness.  Hematological:  Negative for adenopathy. Does not bruise/bleed easily.  Psychiatric/Behavioral:  Negative for confusion.    PHYSICAL EXAMINATION:  Vitals:   09/30/23 0919  BP: (!) 154/67  Pulse: 73  Resp: 18  Temp: (!) 96 F (35.6 C)  SpO2: 100%   Filed Weights   09/30/23 0919  Weight: 167 lb 4.8 oz (75.9 kg)    Physical Exam Constitutional:      General: He is not in acute distress. HENT:     Head: Normocephalic and atraumatic.  Eyes:     General: No scleral icterus. Cardiovascular:     Rate and Rhythm: Normal rate and regular rhythm.     Heart sounds: Normal heart sounds.  Pulmonary:     Effort: Pulmonary effort is normal. No respiratory distress.     Breath sounds: No wheezing.  Abdominal:     General: Bowel sounds are normal. There is no distension.     Palpations: Abdomen is soft.  Musculoskeletal:        General: No deformity. Normal range of motion.     Cervical back: Normal range of motion and neck supple.  Skin:    General: Skin is warm and dry.     Findings: No erythema or rash.  Neurological:     Mental Status: He is alert and oriented to person, place, and time. Mental status is at baseline.  Psychiatric:        Mood and Affect: Mood normal.     LABORATORY DATA:  I have reviewed the data as listed    Latest Ref Rng & Units 09/10/2023    7:30 AM 06/08/2023    7:43 AM 06/04/2023    7:47 AM  CBC  WBC 3.8 - 10.8 Thousand/uL 5.9  5.9  5.0   Hemoglobin 13.2 -  17.1 g/dL 24.4  01.0  27.2   Hematocrit 38.5 - 50.0 % 30.9  32.6  30.5   Platelets 140 - 400 Thousand/uL 195  233  205       Latest Ref Rng & Units 09/10/2023    7:30 AM 06/04/2023    7:47 AM 08/12/2022    9:18 AM  CMP  Glucose 65 - 99 mg/dL  536  644   BUN 7 - 25 mg/dL  29  23   Creatinine 0.34 - 1.22 mg/dL  7.42  5.95   Sodium 638 - 146  mmol/L  136  133   Potassium 3.5 - 5.3 mmol/L  3.4  3.6   Chloride 98 - 110 mmol/L  96  94   CO2 20 - 32 mmol/L  30  28   Calcium  8.6 - 10.3 mg/dL 8.6 - 75.6 mg/dL  8.9    8.9  9.5   Total Protein 6.1 - 8.1 g/dL 6.8  6.9  7.3   Total Bilirubin 0.2 - 1.2 mg/dL  0.7  0.7   Alkaline Phos 39 - 117 U/L   89   AST 10 - 35 U/L  14  18   ALT 9 - 46 U/L  14  20       RADIOGRAPHIC STUDIES: I have personally reviewed the radiological images as listed and agreed with the findings in the report. CUP PACEART REMOTE DEVICE CHECK Result Date: 09/30/2023 PPM Scheduled remote reviewed. Normal device function.  Presenting rhythm: AP/VS Next remote 91 days. LA, CVRS

## 2023-09-30 NOTE — Assessment & Plan Note (Signed)
 Patient has had SPEP done which showed no M protein. Encourage oral hydration and avoid nephrotoxins.

## 2023-10-01 ENCOUNTER — Encounter: Payer: Self-pay | Admitting: Dermatology

## 2023-11-10 ENCOUNTER — Other Ambulatory Visit (HOSPITAL_COMMUNITY): Payer: Self-pay

## 2023-11-16 NOTE — Progress Notes (Signed)
 Remote pacemaker transmission.

## 2023-11-16 NOTE — Addendum Note (Signed)
 Addended by: TAWNI DRILLING D on: 11/16/2023 05:51 PM   Modules accepted: Orders

## 2023-11-24 ENCOUNTER — Other Ambulatory Visit: Payer: Self-pay | Admitting: Student

## 2023-11-24 ENCOUNTER — Other Ambulatory Visit: Payer: Self-pay

## 2023-11-24 DIAGNOSIS — I4891 Unspecified atrial fibrillation: Secondary | ICD-10-CM

## 2023-11-24 MED ORDER — APIXABAN 2.5 MG PO TABS
2.5000 mg | ORAL_TABLET | Freq: Two times a day (BID) | ORAL | 1 refills | Status: DC
Start: 2023-11-24 — End: 2024-03-04
  Filled 2023-11-24: qty 180, 90d supply, fill #0

## 2023-11-30 DIAGNOSIS — Z961 Presence of intraocular lens: Secondary | ICD-10-CM | POA: Diagnosis not present

## 2023-12-02 ENCOUNTER — Encounter: Payer: Self-pay | Admitting: Student

## 2023-12-02 ENCOUNTER — Ambulatory Visit: Admitting: Student

## 2023-12-02 VITALS — BP 128/78 | HR 73 | Temp 97.6°F | Ht 65.0 in | Wt 174.6 lb

## 2023-12-02 DIAGNOSIS — Z950 Presence of cardiac pacemaker: Secondary | ICD-10-CM

## 2023-12-02 DIAGNOSIS — L570 Actinic keratosis: Secondary | ICD-10-CM

## 2023-12-02 DIAGNOSIS — D649 Anemia, unspecified: Secondary | ICD-10-CM | POA: Diagnosis not present

## 2023-12-02 DIAGNOSIS — M25511 Pain in right shoulder: Secondary | ICD-10-CM

## 2023-12-02 NOTE — Progress Notes (Signed)
 [facility]: [facility] Provider: ABDUL  Code Status: Full Code Goals of Care:     12/02/2023    2:19 PM  Advanced Directives  Does Patient Have a Medical Advance Directive? Yes  Type of Estate agent of [place];Living will  Does patient want to make changes to medical advance directive? No - Patient declined  Copy of Healthcare Power of Attorney in Chart? No - copy requested     Chief Complaint  Patient presents with   Medical Management of Chronic Issues    Medical Management of Chronic Issues. 3 Month follow up    HPI: Patient is a 89 y.o. male seen today for medical management of chronic diseases.   Discussed the use of AI scribe software for clinical note transcription with the patient, who gave verbal consent to proceed.  History of Present Illness   Jason Lambert is an 89 year old male who presents for follow-up regarding his upcoming blood work and iron infusion.  He is scheduled for new blood work in two to three weeks, prior to his follow-up appointment in August. He is also planning to see another specialist at the [facility] soon. He has been taking iron supplements, but there is concern about his effectiveness, and he is scheduled for an iron infusion.  No new skin concerns. A previous Mohs procedure site on his back was left open due to insufficient skin for closure and remains scarred. The site was left open due to insufficient skin for closure, but it now appears to be healthy tissue.  He uses Voltaren gel on his shoulder daily, which effectively manages his symptoms. His shoulder occasionally 'catches' but is not painful. He declined additional exercises, indicating the current treatment is sufficient.  He is scheduled for a heart device evaluation for his pacemaker on August 26th, which he describes as an automatic process not requiring his active participation.  He feels good overall with no new complaints. No  new skin lesions or concerns.         Past Medical History:  Diagnosis Date   Adenocarcinoma (HCC)    Prostate   Allergic rhinitis    Atrial fibrillation (HCC)    BCC (basal cell carcinoma of skin) 06/15/2023   R lower leg, MOHs pending   BCC (basal cell carcinoma of skin) 06/23/2023   mid back, Mohs referral sent   Grave's disease    Hyperlipidemia    Hypertension    Prostate cancer (HCC)    Renal insufficiency    sinus node dysfunction    s/p BTK pacemaker    Past Surgical History:  Procedure Laterality Date   CATARACT EXTRACTION W/ INTRAOCULAR LENS  IMPLANT, BILATERAL  2012   PACEMAKER GENERATOR CHANGE N/A 05/02/2013   Procedure: PACEMAKER GENERATOR CHANGE;  Surgeon: Elspeth JAYSON Sage, MD;  Location: [facility] CATH LAB;  Service: Cardiovascular;  Laterality: N/A;   PACEMAKER INSERTION  8/06; 05/02/2013   Bradycardia/ syncope; BTK with generator change 05/02/2013 by Dr Sage   Prostate Cancer  2007   RT   TONSILLECTOMY  1947   adenoids    No Known Allergies  Outpatient Encounter Medications as of 12/02/2023  Medication Sig   acetaminophen  (TYLENOL ) 325 MG tablet Take 650 mg by mouth at bedtime.   apixaban  (ELIQUIS ) 2.5 MG TABS tablet Take 1 tablet (2.5 mg total) by mouth 2 (two) times daily.   atorvastatin  (LIPITOR) 10 MG tablet Take 1 tablet (10 mg  total) by mouth daily.   cholecalciferol (VITAMIN D3) 25 MCG (1000 UNIT) tablet Take 1,000 Units by mouth daily. Per patient taking 800 units daily   hydrochlorothiazide  (HYDRODIURIL ) 25 MG tablet Take 1 tablet (25 mg total) by mouth daily.   iron polysaccharides (NIFEREX) 150 MG capsule Take 150 mg by mouth daily.   loratadine (CLARITIN) 10 MG tablet Take 10 mg by mouth daily as needed.   metoprolol  succinate (TOPROL -XL) 100 MG 24 hr tablet Take 1 tablet (100 mg total) by mouth daily. Take with or immediately following a meal.   Multiple Vitamins-Minerals (CENTRUM SILVER PO) Take 1 tablet by mouth daily.   mupirocin  ointment  (BACTROBAN ) 2 % Apply 1 Application topically 2 (two) times daily.   oxyCODONE  (OXY IR/ROXICODONE ) 5 MG immediate release tablet Take 1 tablet (5 mg total) by mouth every 6 (six) hours as needed for up to 8 doses for severe pain (pain score 7-10).   triamcinolone  cream (KENALOG ) 0.1 % Apply 1 Application topically 2 (two) times daily.   No facility-administered encounter medications on file as of 12/02/2023.    Review of Systems:  Review of Systems  Health Maintenance  Topic Date Due   Medicare Annual Wellness (AWV)  08/12/2023   COVID-19 Vaccine [phone] - 2024-25 season) 10/09/2023   INFLUENZA VACCINE  12/04/2023   DTaP/Tdap/Td (4 - Td or Tdap) 04/12/2024   Pneumococcal Vaccine: 50+ Years  Completed   Zoster Vaccines- Shingrix  Completed   Hepatitis B Vaccines  Aged Out   HPV VACCINES  Aged Out   Meningococcal B Vaccine  Aged Out    Physical Exam: Vitals:   12/02/23 1416  BP: 128/78  Pulse: 73  Temp: 97.6 F (36.4 C)  SpO2: 98%  Weight: 174 lb 9.6 oz (79.2 kg)  Height: 5' 5 (1.651 m)   Body mass index is 29.05 kg/m. Physical Exam Constitutional:      Appearance: Normal appearance.  Cardiovascular:     Rate and Rhythm: Normal rate and regular rhythm.     Pulses: Normal pulses.     Heart sounds: Normal heart sounds.  Pulmonary:     Effort: Pulmonary effort is normal.  Abdominal:     General: Abdomen is flat. Bowel sounds are normal.     Palpations: Abdomen is soft.  Musculoskeletal:        General: No swelling or tenderness.  Skin:    General: Skin is warm and dry.  Neurological:     Mental Status: He is alert and oriented to person, place, and time.     Gait: Gait normal.  Psychiatric:        Mood and Affect: Mood normal.     Labs reviewed: Basic Metabolic Panel: Recent Labs    06/04/23 0747  NA 136  K 3.4*  CL 96*  CO2 30  GLUCOSE 141*  BUN 29*  CREATININE 1.77*  CALCIUM  8.9  8.9  TSH 3.64   Liver Function Tests: Recent Labs    06/04/23 0747  09/10/23 0730  AST 14  --   ALT 14  --   BILITOT 0.7  --   PROT 6.9 6.8   No results for input(s): LIPASE, AMYLASE in the last 8760 hours. No results for input(s): AMMONIA in the last 8760 hours. CBC: Recent Labs    06/04/23 0747 06/08/23 0743 09/10/23 0730  WBC 5.0 5.9 5.9  NEUTROABS 2,170 2,702 2,696  HGB 10.6* 11.1* 10.6*  HCT 30.5* 32.6* 30.9*  MCV 98.4 101.9*  99.0  PLT 205 233 195   Lipid Panel: Recent Labs    06/04/23 0747  CHOL 128  HDL 35*  LDLCALC 64  TRIG 769*  CHOLHDL 3.7   Lab Results  Component Value Date   HGBA1C 6.7 (H) 06/08/2023    Procedures since last visit: No results found. Results          Assessment/Plan    Iron deficiency anemia Iron deficiency anemia is being managed with oral iron supplementation, although there is concern that it may not be effective. - Proceed with scheduled iron infusion - Monitor for infusion reactions and manage with Tylenol  if necessary  Shoulder pain Shoulder pain is being managed effectively with Voltaren gel, which is providing significant relief. - Continue using Voltaren gel for shoulder pain relief  Skin cancer, status post Mohs surgery Status post Mohs surgery for skin cancer with a well-healed scar. The surgical site on the back was closed, while another site was left open due to insufficient skin, but it has healed with healthy tissue.      Pacemaker Monitored by Cardiology.    Labs/tests ordered:  * No order type specified * Next appt:  03/04/2024

## 2023-12-02 NOTE — Patient Instructions (Addendum)
 VISIT SUMMARY:  You came in today for a follow-up regarding your upcoming blood work and iron infusion. You are scheduled for new blood work in two to three weeks before your follow-up appointment with your oncologist in August. You are currently taking iron supplements, but there is concern about their effectiveness, leading to a scheduled iron infusion. You reported feeling good overall with no new skin concerns and noted improvement in your shoulder pain with the use of Voltaren gel. You also have a heart device evaluation scheduled for August 26th.  YOUR PLAN:  -IRON DEFICIENCY ANEMIA: Iron deficiency anemia means you have a lower than normal amount of iron in your blood, which can cause fatigue and weakness. You are currently taking iron supplements, but since they may not be effective enough, you are scheduled for an iron infusion. This treatment is generally well tolerated, but you should monitor for any reactions like warmth or redness, which can be managed with Tylenol  and by slowing the infusion rate.  -HISTORY OF SKIN CANCER TREATED WITH MOHS SURGERY: Your previous Mohs surgery site on your back is healing well with healthy tissue, and there are no new skin lesions of concern.  -SHOULDER PAIN: Your shoulder pain is being managed effectively with Voltaren gel, which you apply daily. The pain is not severe and does not require additional interventions at this time. Continue using the Voltaren gel daily.  INSTRUCTIONS:  Proceed with your scheduled iron infusion and monitor for any infusion reactions. If you experience any reactions, manage them with Tylenol  and inform the medical staff. Continue using Voltaren gel daily for your shoulder pain. You have a heart device evaluation scheduled for August 26th. '  August 28 please come in for labs at 7:30 AM-- this is in the [facility] [place].

## 2023-12-28 DIAGNOSIS — R768 Other specified abnormal immunological findings in serum: Secondary | ICD-10-CM | POA: Diagnosis not present

## 2023-12-28 DIAGNOSIS — Z862 Personal history of diseases of the blood and blood-forming organs and certain disorders involving the immune mechanism: Secondary | ICD-10-CM | POA: Diagnosis not present

## 2023-12-28 DIAGNOSIS — Z8639 Personal history of other endocrine, nutritional and metabolic disease: Secondary | ICD-10-CM | POA: Diagnosis not present

## 2023-12-28 DIAGNOSIS — N189 Chronic kidney disease, unspecified: Secondary | ICD-10-CM | POA: Diagnosis not present

## 2023-12-29 ENCOUNTER — Ambulatory Visit (INDEPENDENT_AMBULATORY_CARE_PROVIDER_SITE_OTHER): Payer: PPO

## 2023-12-29 DIAGNOSIS — I495 Sick sinus syndrome: Secondary | ICD-10-CM

## 2023-12-30 ENCOUNTER — Other Ambulatory Visit

## 2023-12-30 LAB — CUP PACEART REMOTE DEVICE CHECK
Battery Voltage: 35
Date Time Interrogation Session: 20250826110333
Implantable Lead Connection Status: 753985
Implantable Lead Connection Status: 753985
Implantable Lead Implant Date: 20060801
Implantable Lead Implant Date: 20060801
Implantable Lead Location: 753859
Implantable Lead Location: 753860
Implantable Lead Model: 350
Implantable Lead Serial Number: 24319010
Implantable Lead Serial Number: 24332742
Implantable Pulse Generator Implant Date: 20141229
Pulse Gen Serial Number: 68145068

## 2023-12-31 DIAGNOSIS — D649 Anemia, unspecified: Secondary | ICD-10-CM | POA: Diagnosis not present

## 2023-12-31 LAB — CBC WITH DIFFERENTIAL/PLATELET
Absolute Lymphocytes: 1397 {cells}/uL (ref 850–3900)
Absolute Monocytes: 1269 {cells}/uL — ABNORMAL HIGH (ref 200–950)
Basophils Absolute: 10 {cells}/uL (ref 0–200)
Basophils Relative: 0.2 %
Eosinophils Absolute: 39 {cells}/uL (ref 15–500)
Eosinophils Relative: 0.8 %
HCT: 30.8 % — ABNORMAL LOW (ref 38.5–50.0)
Hemoglobin: 10.7 g/dL — ABNORMAL LOW (ref 13.2–17.1)
MCH: 34.4 pg — ABNORMAL HIGH (ref 27.0–33.0)
MCHC: 34.7 g/dL (ref 32.0–36.0)
MCV: 99 fL (ref 80.0–100.0)
MPV: 10.6 fL (ref 7.5–12.5)
Monocytes Relative: 25.9 %
Neutro Abs: 2185 {cells}/uL (ref 1500–7800)
Neutrophils Relative %: 44.6 %
Platelets: 234 Thousand/uL (ref 140–400)
RBC: 3.11 Million/uL — ABNORMAL LOW (ref 4.20–5.80)
RDW: 12.7 % (ref 11.0–15.0)
Total Lymphocyte: 28.5 %
WBC: 4.9 Thousand/uL (ref 3.8–10.8)

## 2023-12-31 LAB — IRON,TIBC AND FERRITIN PANEL
%SAT: 52 % — ABNORMAL HIGH (ref 20–48)
Ferritin: 50 ng/mL (ref 24–380)
Iron: 169 ug/dL (ref 50–180)
TIBC: 323 ug/dL (ref 250–425)

## 2024-01-01 ENCOUNTER — Ambulatory Visit: Payer: Self-pay | Admitting: Student

## 2024-01-03 ENCOUNTER — Ambulatory Visit: Payer: Self-pay | Admitting: Cardiology

## 2024-01-06 ENCOUNTER — Inpatient Hospital Stay: Attending: Oncology | Admitting: Oncology

## 2024-01-06 ENCOUNTER — Encounter: Payer: Self-pay | Admitting: Oncology

## 2024-01-06 ENCOUNTER — Inpatient Hospital Stay

## 2024-01-06 VITALS — BP 152/68 | HR 91 | Temp 96.0°F | Resp 18 | Wt 169.8 lb

## 2024-01-06 DIAGNOSIS — D631 Anemia in chronic kidney disease: Secondary | ICD-10-CM | POA: Diagnosis not present

## 2024-01-06 DIAGNOSIS — R760 Raised antibody titer: Secondary | ICD-10-CM | POA: Diagnosis not present

## 2024-01-06 DIAGNOSIS — R768 Other specified abnormal immunological findings in serum: Secondary | ICD-10-CM

## 2024-01-06 DIAGNOSIS — D509 Iron deficiency anemia, unspecified: Secondary | ICD-10-CM | POA: Diagnosis not present

## 2024-01-06 DIAGNOSIS — Z950 Presence of cardiac pacemaker: Secondary | ICD-10-CM | POA: Diagnosis not present

## 2024-01-06 DIAGNOSIS — N1832 Chronic kidney disease, stage 3b: Secondary | ICD-10-CM | POA: Diagnosis not present

## 2024-01-06 DIAGNOSIS — Z8546 Personal history of malignant neoplasm of prostate: Secondary | ICD-10-CM | POA: Diagnosis not present

## 2024-01-06 NOTE — Progress Notes (Signed)
 Hematology/Oncology Progress note [phone] [phone]           REFERRING PROVIDER: Abdul Fine, MD   CHIEF COMPLAINTS/REASON FOR VISIT:  Follow-up for anemia due to chronic kidney disease.   ASSESSMENT & PLAN:   Anemia in chronic kidney disease (CKD) Previous labs were reviewed and discussed with patient. Anemia is likely secondary to chronic kidney disease. Lab Results  Component Value Date   HGB 10.7 (L) 12/31/2023   TIBC 323 12/31/2023   IRONPCTSAT 52 (H) 12/31/2023   FERRITIN 50 12/31/2023    Hemoglobin is above 10. Iron panel has improved.  No need for IV Venofer treatments. No need for intervention.   I recommend patient continue oral iron supplementation Vitron-C 1 tablet daily goal ferritin 200.   Elevated antinuclear antibody (ANA) level Patient has established care at rheumatology.  Continue follow-up.  Stage 3b chronic kidney disease (HCC) Patient has had SPEP done which showed no M protein. Encourage oral hydration and avoid nephrotoxins.     Orders Placed This Encounter  Procedures   CBC with Differential (Cancer Center Only)    Standing Status:   Future    Expected Date:   05/07/2024    Expiration Date:   08/05/2024   Iron and TIBC    Standing Status:   Future    Expected Date:   05/07/2024    Expiration Date:   08/05/2024   Ferritin    Standing Status:   Future    Expected Date:   05/07/2024    Expiration Date:   08/05/2024   Retic Panel    Standing Status:   Future    Expected Date:   05/07/2024    Expiration Date:   08/05/2024   Follow up in 4 months All questions were answered. The patient knows to call the clinic with any problems, questions or concerns.  Zelphia Cap, MD, PhD [facility] Hematology Oncology 01/06/2024   HISTORY OF PRESENTING ILLNESS:   Jason Lambert is a  89 y.o.  male with PMH listed below was seen in consultation at the request of  Abdul Fine, MD  for evaluation of anemia.   Patient has  chronic history of anemia since at least 2020.  His baseline hemoglobin level has progressively decreased over the past few years and currently at 10.6 on 09/10/2023.  He has chronic kidney disease, His previous workup showed normal iron, B12, and folate levels, although B12 was borderline. He also has elevated ANA levels and has been referred to a rheumatologist for further evaluation.  06/04/2023, iron saturation 41, ferritin 34.  Patient reports feeling tired sometimes.  No chest pain, shortness of breath.  Patient denies any blood in the stool or dark tarry stool.  No bowel habit changes.   INTERVAL HISTORY Jason Lambert is a 89 y.o. male who has above history reviewed by me today presents for follow up visit for anemia due to chronic kidney disease Patient takes vitamin C 1 tablet daily.  Tolerated well.  No new complaints.  He has established care with rheumatology.    MEDICAL HISTORY:  Past Medical History:  Diagnosis Date   Adenocarcinoma (HCC)    Prostate   Allergic rhinitis    Atrial fibrillation (HCC)    BCC (basal cell carcinoma of skin) 06/15/2023   R lower leg, MOHs pending   BCC (basal cell carcinoma of skin) 06/23/2023   mid back, Mohs referral sent   Grave's disease    Hyperlipidemia  Hypertension    Prostate cancer (HCC)    Renal insufficiency    sinus node dysfunction    s/p BTK pacemaker    SURGICAL HISTORY: Past Surgical History:  Procedure Laterality Date   CATARACT EXTRACTION W/ INTRAOCULAR LENS  IMPLANT, BILATERAL  2012   PACEMAKER GENERATOR CHANGE N/A 05/02/2013   Procedure: PACEMAKER GENERATOR CHANGE;  Surgeon: Elspeth JAYSON Sage, MD;  Location: [facility] CATH LAB;  Service: Cardiovascular;  Laterality: N/A;   PACEMAKER INSERTION  8/06; 05/02/2013   Bradycardia/ syncope; BTK with generator change 05/02/2013 by Dr Sage   Prostate Cancer  2007   RT   TONSILLECTOMY  1947   adenoids    SOCIAL HISTORY: Social History   Socioeconomic History   Marital  status: Married    Spouse name: Not on file   Number of children: 2   Years of education: Not on file   Highest education level: Bachelor's degree (e.g., BA, AB, BS)  Occupational History   Occupation: Retired    Comment: Acupuncturist with [employer]  Tobacco Use   Smoking status: Never    Passive exposure: Never   Smokeless tobacco: Never  Vaping Use   Vaping status: Never Used  Substance and Sexual Activity   Alcohol use: No    Comment: Occasional   Drug use: No   Sexual activity: Not on file  Other Topics Concern   Not on file  Social History Narrative   Has a living will.    Wife to make health care decisions (then daughter [name])--formal health care POA   Would accept resuscitation   Not sure about feeding tube but no unusual attempts   Donating body to [employer]      Social Drivers of Health   Financial Resource Strain: Low Risk  (12/28/2023)   Received from [facility]   Overall Financial Resource Strain (CARDIA)    Difficulty of Paying Living Expenses: Not very hard  Food Insecurity: No Food Insecurity (12/28/2023)   Received from [facility]   Hunger Vital Sign    Within the past 12 months, you worried that your food would run out before you got the money to buy more.: Never true    Within the past 12 months, the food you bought just didn't last and you didn't have money to get more.: Never true  Transportation Needs: No Transportation Needs (12/28/2023)   Received from [facility] - Transportation    In the past 12 months, has lack of transportation kept you from medical appointments or from getting medications?: No    Lack of Transportation (Non-Medical): No  Physical Activity: Sufficiently Active (05/30/2023)   Exercise Vital Sign    Days of Exercise per Week: 5 days    Minutes of Exercise per Session: 30 min  Stress: No Stress Concern Present (05/30/2023)   [employer] of  Occupational Health - Occupational Stress Questionnaire    Feeling of Stress : Not at all  Social Connections: Moderately Integrated (05/30/2023)   Social Connection and Isolation Panel    Frequency of Communication with Friends and Family: Once a week    Frequency of Social Gatherings with Friends and Family: Once a week    Attends Religious Services: 1 to 4 times per year    Active Member of [employer] or Organizations: Yes    Attends Engineer, structural: More than 4 times per year    Marital Status: Married  Intimate  Partner Violence: Not At Risk (12/02/2023)   Humiliation, Afraid, Rape, and Kick questionnaire    Fear of Current or Ex-Partner: No    Emotionally Abused: No    Physically Abused: No    Sexually Abused: No    FAMILY HISTORY: Family History  Problem Relation Age of Onset   Cancer Mother    Diabetes Mother    Coronary artery disease Father        cabg   Hypertension Neg Hx    Prostate cancer Neg Hx    Colon cancer Neg Hx     ALLERGIES:  has no known allergies.  MEDICATIONS:  Current Outpatient Medications  Medication Sig Dispense Refill   acetaminophen  (TYLENOL ) 325 MG tablet Take 650 mg by mouth at bedtime.     apixaban  (ELIQUIS ) 2.5 MG TABS tablet Take 1 tablet (2.5 mg total) by mouth 2 (two) times daily. 180 tablet 1   atorvastatin  (LIPITOR) 10 MG tablet Take 1 tablet (10 mg total) by mouth daily. 90 tablet 3   cholecalciferol (VITAMIN D3) 25 MCG (1000 UNIT) tablet Take 1,000 Units by mouth daily. Per patient taking 800 units daily     hydrochlorothiazide  (HYDRODIURIL ) 25 MG tablet Take 1 tablet (25 mg total) by mouth daily. 90 tablet 3   iron polysaccharides (NIFEREX) 150 MG capsule Take 150 mg by mouth every other day.     loratadine (CLARITIN) 10 MG tablet Take 10 mg by mouth daily as needed.     metoprolol  succinate (TOPROL -XL) 100 MG 24 hr tablet Take 1 tablet (100 mg total) by mouth daily. Take with or immediately following a meal. 90 tablet 3    Multiple Vitamins-Minerals (CENTRUM SILVER PO) Take 1 tablet by mouth daily.     mupirocin  ointment (BACTROBAN ) 2 % Apply 1 Application topically 2 (two) times daily. 22 g 2   oxyCODONE  (OXY IR/ROXICODONE ) 5 MG immediate release tablet Take 1 tablet (5 mg total) by mouth every 6 (six) hours as needed for up to 8 doses for severe pain (pain score 7-10). 8 tablet 0   triamcinolone  cream (KENALOG ) 0.1 % Apply 1 Application topically 2 (two) times daily. 30 g 0   No current facility-administered medications for this visit.    Review of Systems  Constitutional:  Positive for fatigue. Negative for appetite change, chills, fever and unexpected weight change.  HENT:   Negative for hearing loss and voice change.   Eyes:  Negative for eye problems and icterus.  Respiratory:  Negative for chest tightness, cough and shortness of breath.   Cardiovascular:  Negative for chest pain and leg swelling.  Gastrointestinal:  Negative for abdominal distention and abdominal pain.  Endocrine: Negative for hot flashes.  Genitourinary:  Negative for difficulty urinating, dysuria and frequency.   Musculoskeletal:  Negative for arthralgias.  Skin:  Negative for itching and rash.  Neurological:  Negative for light-headedness and numbness.  Hematological:  Negative for adenopathy. Does not bruise/bleed easily.  Psychiatric/Behavioral:  Negative for confusion.    PHYSICAL EXAMINATION:  Vitals:   01/06/24 0944 01/06/24 0951  BP: (!) 151/66 (!) 152/68  Pulse: 91   Resp: 18   Temp: (!) 96 F (35.6 C)   SpO2: 100%    Filed Weights   01/06/24 0944  Weight: 169 lb 12.8 oz (77 kg)    Physical Exam Constitutional:      General: He is not in acute distress. HENT:     Head: Normocephalic and atraumatic.  Eyes:  General: No scleral icterus. Cardiovascular:     Rate and Rhythm: Normal rate and regular rhythm.  Pulmonary:     Effort: Pulmonary effort is normal. No respiratory distress.     Breath sounds: No  wheezing.  Abdominal:     General: Bowel sounds are normal. There is no distension.     Palpations: Abdomen is soft.  Musculoskeletal:        General: No deformity. Normal range of motion.     Cervical back: Normal range of motion and neck supple.  Skin:    General: Skin is warm and dry.     Findings: No erythema or rash.  Neurological:     Mental Status: He is alert and oriented to person, place, and time. Mental status is at baseline.  Psychiatric:        Mood and Affect: Mood normal.     LABORATORY DATA:  I have reviewed the data as listed    Latest Ref Rng & Units 12/31/2023    8:10 AM 09/10/2023    7:30 AM 06/08/2023    7:43 AM  CBC  WBC 3.8 - 10.8 Thousand/uL 4.9  5.9  5.9   Hemoglobin 13.2 - 17.1 g/dL 89.2  89.3  88.8   Hematocrit 38.5 - 50.0 % 30.8  30.9  32.6   Platelets 140 - 400 Thousand/uL 234  195  233       Latest Ref Rng & Units 09/10/2023    7:30 AM 06/04/2023    7:47 AM 08/12/2022    9:18 AM  CMP  Glucose 65 - 99 mg/dL  858  884   BUN 7 - 25 mg/dL  29  23   Creatinine 9.29 - 1.22 mg/dL  8.22  8.34   Sodium 864 - 146 mmol/L  136  133   Potassium 3.5 - 5.3 mmol/L  3.4  3.6   Chloride 98 - 110 mmol/L  96  94   CO2 20 - 32 mmol/L  30  28   Calcium  8.6 - 10.3 mg/dL 8.6 - 89.6 mg/dL  8.9    8.9  9.5   Total Protein 6.1 - 8.1 g/dL 6.8  6.9  7.3   Total Bilirubin 0.2 - 1.2 mg/dL  0.7  0.7   Alkaline Phos 39 - 117 U/L   89   AST 10 - 35 U/L  14  18   ALT 9 - 46 U/L  14  20       RADIOGRAPHIC STUDIES: I have personally reviewed the radiological images as listed and agreed with the findings in the report. CUP PACEART REMOTE DEVICE CHECK Result Date: 12/30/2023 PPM Scheduled remote reviewed. Normal device function.  Presenting rhythm: AP/VS, from 8/1 periodic EGM Next remote 91 days. LA, CVRS

## 2024-01-06 NOTE — Assessment & Plan Note (Signed)
 Patient has had SPEP done which showed no M protein. Encourage oral hydration and avoid nephrotoxins.

## 2024-01-06 NOTE — Assessment & Plan Note (Signed)
 Previous labs were reviewed and discussed with patient. Anemia is likely secondary to chronic kidney disease. Lab Results  Component Value Date   HGB 10.7 (L) 12/31/2023   TIBC 323 12/31/2023   IRONPCTSAT 52 (H) 12/31/2023   FERRITIN 50 12/31/2023    Hemoglobin is above 10. Iron panel has improved.  No need for IV Venofer treatments. No need for intervention.   I recommend patient continue oral iron supplementation Vitron-C 1 tablet daily goal ferritin 200.

## 2024-01-06 NOTE — Assessment & Plan Note (Signed)
 Patient has established care at rheumatology.  Continue follow-up.

## 2024-01-07 ENCOUNTER — Ambulatory Visit: Admitting: Dermatology

## 2024-01-07 ENCOUNTER — Encounter: Payer: Self-pay | Admitting: Dermatology

## 2024-01-07 DIAGNOSIS — L814 Other melanin hyperpigmentation: Secondary | ICD-10-CM | POA: Diagnosis not present

## 2024-01-07 DIAGNOSIS — L720 Epidermal cyst: Secondary | ICD-10-CM

## 2024-01-07 DIAGNOSIS — W908XXA Exposure to other nonionizing radiation, initial encounter: Secondary | ICD-10-CM

## 2024-01-07 DIAGNOSIS — Z1283 Encounter for screening for malignant neoplasm of skin: Secondary | ICD-10-CM

## 2024-01-07 DIAGNOSIS — L821 Other seborrheic keratosis: Secondary | ICD-10-CM | POA: Diagnosis not present

## 2024-01-07 DIAGNOSIS — L578 Other skin changes due to chronic exposure to nonionizing radiation: Secondary | ICD-10-CM | POA: Diagnosis not present

## 2024-01-07 DIAGNOSIS — D1801 Hemangioma of skin and subcutaneous tissue: Secondary | ICD-10-CM

## 2024-01-07 DIAGNOSIS — D229 Melanocytic nevi, unspecified: Secondary | ICD-10-CM

## 2024-01-07 DIAGNOSIS — Z85828 Personal history of other malignant neoplasm of skin: Secondary | ICD-10-CM

## 2024-01-07 DIAGNOSIS — L570 Actinic keratosis: Secondary | ICD-10-CM | POA: Diagnosis not present

## 2024-01-07 NOTE — Progress Notes (Signed)
 Follow-Up Visit   Subjective  Jason Lambert is a 89 y.o. male who presents for the following: Skin Cancer Screening and Full Body Skin Exam  The patient presents for Total-Body Skin Exam (TBSE) for skin cancer screening and mole check. The patient has spots, moles and lesions to be evaluated, some may be new or changing and the patient may have concern these could be cancer.  The following portions of the chart were reviewed this encounter and updated as appropriate: medications, allergies, medical history  Review of Systems:  No other skin or systemic complaints except as noted in HPI or Assessment and Plan.  Objective  Well appearing patient in no apparent distress; mood and affect are within normal limits.  A full examination was performed including scalp, head, eyes, ears, nose, lips, neck, chest, axillae, abdomen, back, buttocks, bilateral upper extremities, bilateral lower extremities, hands, feet, fingers, toes, fingernails, and toenails. All findings within normal limits unless otherwise noted below.   Relevant physical exam findings are noted in the Assessment and Plan.  Face Erythematous thin papules/macules with gritty scale.   Assessment & Plan   SKIN CANCER SCREENING PERFORMED TODAY.  ACTINIC DAMAGE - Chronic condition, secondary to cumulative UV/sun exposure - diffuse scaly erythematous macules with underlying dyspigmentation - Recommend daily broad spectrum sunscreen SPF 30+ to sun-exposed areas, reapply every 2 hours as needed.  - Staying in the shade or wearing long sleeves, sun glasses (UVA+UVB protection) and wide brim hats (4-inch brim around the entire circumference of the hat) are also recommended for sun protection.  - Call for new or changing lesions.  LENTIGINES, SEBORRHEIC KERATOSES, HEMANGIOMAS - Benign normal skin lesions - Benign-appearing - Call for any changes  MELANOCYTIC NEVI - Tan-brown and/or pink-flesh-colored symmetric macules and  papules - Benign appearing on exam today - Observation - Call clinic for new or changing moles - Recommend daily use of broad spectrum spf 30+ sunscreen to sun-exposed areas.   HISTORY OF BASAL CELL CARCINOMA OF THE SKIN - No evidence of recurrence today - Recommend regular full body skin exams - Recommend daily broad spectrum sunscreen SPF 30+ to sun-exposed areas, reapply every 2 hours as needed.  - Call if any new or changing lesions are noted between office visits AK (ACTINIC KERATOSIS) Face Actinic keratoses are precancerous spots that appear secondary to cumulative UV radiation exposure/sun exposure over time. They are chronic with expected duration over 1 year. A portion of actinic keratoses will progress to squamous cell carcinoma of the skin. It is not possible to reliably predict which spots will progress to skin cancer and so treatment is recommended to prevent development of skin cancer.  Recommend daily broad spectrum sunscreen SPF 30+ to sun-exposed areas, reapply every 2 hours as needed.  Recommend staying in the shade or wearing long sleeves, sun glasses (UVA+UVB protection) and wide brim hats (4-inch brim around the entire circumference of the hat). Call for new or changing lesions.  Discussed observation vs LN2. Will observe.  MULTIPLE BENIGN NEVI   LENTIGINES   ACTINIC ELASTOSIS   SEBORRHEIC KERATOSES   CHERRY ANGIOMA   ACTINIC KERATOSES   EIC (EPIDERMAL INCLUSION CYST)    EPIDERMAL INCLUSION CYST Exam: Subcutaneous nodule at L axilla  Benign-appearing. Exam most consistent with an epidermal inclusion cyst. Discussed that a cyst is a benign growth that can grow over time and sometimes get irritated or inflamed. Recommend observation if it is not bothersome. Discussed option of surgical excision to remove it if  it is growing, symptomatic, or other changes noted. Please call for new or changing lesions so they can be evaluated.  Return in about 6  months (around 07/06/2024) for TBSE - hx BCC.  LILLETTE Rosina Mayans, CMA, am acting as scribe for Boneta Sharps, MD .   Documentation: I have reviewed the above documentation for accuracy and completeness, and I agree with the above.  Boneta Sharps, MD

## 2024-01-07 NOTE — Patient Instructions (Signed)

## 2024-01-19 NOTE — Progress Notes (Signed)
 Remote PPM Transmission

## 2024-02-07 ENCOUNTER — Other Ambulatory Visit (HOSPITAL_COMMUNITY): Payer: Self-pay

## 2024-02-15 DIAGNOSIS — N189 Chronic kidney disease, unspecified: Secondary | ICD-10-CM | POA: Diagnosis not present

## 2024-02-15 DIAGNOSIS — Z8639 Personal history of other endocrine, nutritional and metabolic disease: Secondary | ICD-10-CM | POA: Diagnosis not present

## 2024-02-15 DIAGNOSIS — Z862 Personal history of diseases of the blood and blood-forming organs and certain disorders involving the immune mechanism: Secondary | ICD-10-CM | POA: Diagnosis not present

## 2024-03-04 ENCOUNTER — Other Ambulatory Visit: Payer: Self-pay

## 2024-03-04 ENCOUNTER — Encounter: Payer: Self-pay | Admitting: Orthopedic Surgery

## 2024-03-04 ENCOUNTER — Other Ambulatory Visit (HOSPITAL_BASED_OUTPATIENT_CLINIC_OR_DEPARTMENT_OTHER): Payer: Self-pay

## 2024-03-04 ENCOUNTER — Non-Acute Institutional Stay: Admitting: Orthopedic Surgery

## 2024-03-04 ENCOUNTER — Other Ambulatory Visit (HOSPITAL_COMMUNITY): Payer: Self-pay

## 2024-03-04 VITALS — BP 128/76 | HR 66 | Temp 98.0°F | Ht 65.0 in | Wt 167.8 lb

## 2024-03-04 DIAGNOSIS — Z950 Presence of cardiac pacemaker: Secondary | ICD-10-CM

## 2024-03-04 DIAGNOSIS — D508 Other iron deficiency anemias: Secondary | ICD-10-CM

## 2024-03-04 DIAGNOSIS — I495 Sick sinus syndrome: Secondary | ICD-10-CM

## 2024-03-04 DIAGNOSIS — I10 Essential (primary) hypertension: Secondary | ICD-10-CM

## 2024-03-04 DIAGNOSIS — R7303 Prediabetes: Secondary | ICD-10-CM

## 2024-03-04 DIAGNOSIS — E785 Hyperlipidemia, unspecified: Secondary | ICD-10-CM

## 2024-03-04 DIAGNOSIS — I4891 Unspecified atrial fibrillation: Secondary | ICD-10-CM

## 2024-03-04 MED ORDER — HYDROCHLOROTHIAZIDE 25 MG PO TABS
25.0000 mg | ORAL_TABLET | Freq: Every day | ORAL | 1 refills | Status: AC
  Filled 2024-03-04 – 2024-05-27 (×2): qty 90, 90d supply, fill #0

## 2024-03-04 MED ORDER — ATORVASTATIN CALCIUM 10 MG PO TABS
10.0000 mg | ORAL_TABLET | Freq: Every day | ORAL | 1 refills | Status: AC
  Filled 2024-03-04 – 2024-05-27 (×2): qty 90, 90d supply, fill #0

## 2024-03-04 MED ORDER — METOPROLOL SUCCINATE ER 100 MG PO TB24
100.0000 mg | ORAL_TABLET | Freq: Every day | ORAL | 1 refills | Status: AC
  Filled 2024-03-04 – 2024-05-27 (×2): qty 90, 90d supply, fill #0

## 2024-03-04 MED ORDER — APIXABAN 2.5 MG PO TABS
2.5000 mg | ORAL_TABLET | Freq: Two times a day (BID) | ORAL | 1 refills | Status: AC
  Filled 2024-03-04: qty 180, 90d supply, fill #0
  Filled 2024-05-27: qty 180, 90d supply, fill #1

## 2024-03-04 NOTE — Progress Notes (Signed)
 Location:  Other Nursing Home Room Number: [facility] Place of Service:  Clinic ([phone]) Provider:  Greig FORBES Cluster, NP   Cluster Greig FORBES, NP  Patient Care Team: Cluster Greig FORBES, NP as PCP - General (Adult Health Nurse Practitioner) Fernande Elspeth BROCKS, MD (Inactive) as PCP - Electrophysiology (Cardiology) Fate Morna SAILOR, [facility] (Inactive) as Pharmacist (Pharmacist) Lenn Elspeth, MD (Ophthalmology) Babara Call, MD as Consulting Physician (Hematology and Oncology)  Extended Emergency Contact Information Primary Emergency Contact: [facility] Address: [address]          [place], [place] [address] [place] Home Phone: [phone] Relation: Spouse Secondary Emergency Contact: Galyon,Elianne          [place] , [place] Home Phone: [phone] Relation: Daughter  Code Status:  Full code Goals of care: Advanced Directive information    03/04/2024   10:44 AM  Advanced Directives  Does Patient Have a Medical Advance Directive? Yes  Type of Estate Agent of [place];Living will  Does patient want to make changes to medical advance directive? No - Patient declined  Copy of Healthcare Power of Attorney in Chart? No - copy requested     Chief Complaint  Patient presents with   Medical Management of Chronic Issues    Medical Management of Chronic Issues. 3 Month follow up    HPI:  Pt is a 89 y.o. male seen today for medical management of chronic diseases.    Discussed the use of AI scribe software for clinical note transcription with the patient, who gave verbal consent to proceed.  History of Present Illness   Jason Lambert is an 89 year old male with anemia and atrial fibrillation who presents for follow-up on his anemia treatment and overall health management.  His anemia is improving with iron supplements, leading to increased energy levels. He tolerates the supplement despite its unpleasant taste and  experiences occasional constipation, which resolves spontaneously. He continues with the current regimen.  He experiences occasional right shoulder pain, managed with Voltaren gel, which does not significantly impact his daily activities.  He has a history of atrial fibrillation, managed with a pacemaker, low-dose Eliquis , and metoprolol . He is due for a follow-up with a new cardiologist, Dr. Andra, although the exact date is uncertain.  He has a past medical history of Graves' disease from the 1980s and a positive ANA, which was not elevated enough to indicate a specific autoimmune disease. Lupus was ruled out after evaluation.  No chest pain, shortness of breath, or recent falls. He maintains regular dermatology, ophthalmology, and dental check-ups.  His family history includes diabetes, with his mother having had diabetes and his sister having prediabetes. He is mindful of his weight and diet to manage this risk.    Past Medical History:  Diagnosis Date   Adenocarcinoma [facility])    Prostate   Allergic rhinitis    Atrial fibrillation (HCC)    BCC (basal cell carcinoma of skin) 06/15/2023   R lower leg Mohs 07/14/23   BCC (basal cell carcinoma of skin) 06/23/2023   mid back, Mohs 07/14/2023   Grave's disease    Hyperlipidemia    Hypertension    Prostate cancer [facility])    Renal insufficiency    sinus node dysfunction    s/p BTK pacemaker   Past Surgical History:  Procedure Laterality Date   CATARACT EXTRACTION W/ INTRAOCULAR LENS  IMPLANT, BILATERAL  2012   PACEMAKER GENERATOR CHANGE N/A 05/02/2013  Procedure: PACEMAKER GENERATOR CHANGE;  Surgeon: Elspeth JAYSON Sage, MD;  Location: [facility] CATH LAB;  Service: Cardiovascular;  Laterality: N/A;   PACEMAKER INSERTION  8/06; 05/02/2013   Bradycardia/ syncope; BTK with generator change 05/02/2013 by Dr Sage   Prostate Cancer  2007   RT   TONSILLECTOMY  1947   adenoids    No Known Allergies  Outpatient Encounter Medications as of  03/04/2024  Medication Sig   acetaminophen  (TYLENOL ) 325 MG tablet Take 650 mg by mouth at bedtime.   apixaban  (ELIQUIS ) 2.5 MG TABS tablet Take 1 tablet (2.5 mg total) by mouth 2 (two) times daily.   atorvastatin  (LIPITOR) 10 MG tablet Take 1 tablet (10 mg total) by mouth daily.   cholecalciferol (VITAMIN D3) 25 MCG (1000 UNIT) tablet Take 1,000 Units by mouth daily. Per patient taking 800 units daily   hydrochlorothiazide  (HYDRODIURIL ) 25 MG tablet Take 1 tablet (25 mg total) by mouth daily.   iron polysaccharides (NIFEREX) 150 MG capsule Take 150 mg by mouth daily.   loratadine (CLARITIN) 10 MG tablet Take 10 mg by mouth daily as needed.   metoprolol  succinate (TOPROL -XL) 100 MG 24 hr tablet Take 1 tablet (100 mg total) by mouth daily. Take with or immediately following a meal.   Multiple Vitamins-Minerals (CENTRUM SILVER PO) Take 1 tablet by mouth daily.   triamcinolone  cream (KENALOG ) 0.1 % Apply 1 Application topically 2 (two) times daily.   mupirocin  ointment (BACTROBAN ) 2 % Apply 1 Application topically 2 (two) times daily. (Patient not taking: Reported on 03/04/2024)   oxyCODONE  (OXY IR/ROXICODONE ) 5 MG immediate release tablet Take 1 tablet (5 mg total) by mouth every 6 (six) hours as needed for up to 8 doses for severe pain (pain score 7-10). (Patient not taking: Reported on 03/04/2024)   No facility-administered encounter medications on file as of 03/04/2024.    Review of Systems  Constitutional: Negative.   HENT: Negative.    Respiratory: Negative.    Cardiovascular: Negative.   Gastrointestinal: Negative.   Genitourinary: Negative.   Musculoskeletal:  Positive for arthralgias.  Skin: Negative.   Neurological: Negative.   Hematological: Negative.     Immunization History  Administered Date(s) Administered   Fluad Quad(high Dose 65+) 01/04/2019, 01/25/2020, 01/25/2021   Influenza Split 03/03/2013   Influenza, Seasonal, Injecte, Preservative Fre 02/18/2015, 01/18/2016    Influenza,inj,Quad PF,6+ Mos 02/10/2017, 01/28/2018   Influenza-Unspecified 03/04/2014, 02/26/2023, 02/26/2024   Moderna Covid-19 Fall Seasonal Vaccine 17yrs & older 08/12/2022   Moderna Covid-19 Vaccine Bivalent Booster 59yrs & up 01/24/2021, 09/15/2021   Moderna Sars-Covid-2 Vaccination 05/19/2019, 06/16/2019, 03/20/2020, 09/20/2020, 02/13/2022   Pneumococcal Conjugate-13 04/12/2014   Pneumococcal Polysaccharide-23 01/06/2007, 06/18/2017   Td 05/06/2003, 04/12/2014   Tdap 07/09/2010   Unspecified SARS-COV-2 Vaccination 01/30/2023, 08/14/2023, 02/26/2024   Zoster Recombinant(Shingrix) 12/08/2018, 03/21/2019   Zoster, Live 05/05/2012   Pertinent  Health Maintenance Due  Topic Date Due   Influenza Vaccine  Completed      12/03/2022   12:59 PM 06/03/2023    1:03 PM 09/02/2023    2:26 PM 12/02/2023    2:18 PM 03/04/2024   10:44 AM  Fall Risk  Falls in the past year? 0 0 0 0 0  Was there an injury with Fall? 0 0 0 0 0  Fall Risk Category Calculator 0 0 0 0 0  Patient at Risk for Falls Due to   No Fall Risks No Fall Risks   Fall risk Follow up   Falls evaluation completed Falls  evaluation completed Falls evaluation completed   Functional Status Survey:    Vitals:   03/04/24 1039  BP: 128/76  Pulse: 66  Temp: 98 F (36.7 C)  SpO2: 99%  Weight: 167 lb 12.8 oz (76.1 kg)  Height: 5' 5 (1.651 m)   Body mass index is 27.92 kg/m. Physical Exam Vitals reviewed.  Constitutional:      General: He is not in acute distress. HENT:     Head: Normocephalic.  Eyes:     General:        Right eye: No discharge.        Left eye: No discharge.  Neck:     Thyroid : No thyroid  mass or thyromegaly.  Cardiovascular:     Rate and Rhythm: Normal rate and regular rhythm.     Pulses: Normal pulses.     Heart sounds: Normal heart sounds.  Pulmonary:     Effort: Pulmonary effort is normal.     Breath sounds: Normal breath sounds.  Abdominal:     General: Bowel sounds are normal.      Palpations: Abdomen is soft.  Musculoskeletal:     Cervical back: Neck supple.     Right lower leg: No edema.     Left lower leg: No edema.  Skin:    General: Skin is warm.     Capillary Refill: Capillary refill takes less than 2 seconds.  Neurological:     General: No focal deficit present.     Mental Status: He is alert and oriented to person, place, and time.  Psychiatric:        Mood and Affect: Mood normal.     Labs reviewed: Recent Labs    06/04/23 0747  NA 136  K 3.4*  CL 96*  CO2 30  GLUCOSE 141*  BUN 29*  CREATININE 1.77*  CALCIUM  8.9  8.9   Recent Labs    06/04/23 0747 09/10/23 0730  AST 14  --   ALT 14  --   BILITOT 0.7  --   PROT 6.9 6.8   Recent Labs    06/08/23 0743 09/10/23 0730 12/31/23 0810  WBC 5.9 5.9 4.9  NEUTROABS 2,702 2,696 2,185  HGB 11.1* 10.6* 10.7*  HCT 32.6* 30.9* 30.8*  MCV 101.9* 99.0 99.0  PLT 233 195 234   Lab Results  Component Value Date   TSH 3.64 06/04/2023   Lab Results  Component Value Date   HGBA1C 6.7 (H) 06/08/2023   Lab Results  Component Value Date   CHOL 128 06/04/2023   HDL 35 (L) 06/04/2023   LDLCALC 64 06/04/2023   LDLDIRECT 74.0 08/01/2020   TRIG 230 (H) 06/04/2023   CHOLHDL 3.7 06/04/2023    Significant Diagnostic Results in last 30 days:  No results found.  Assessment/Plan 1. Atrial fibrillation, unspecified type (HCC) - followed by cardiology - HR< 100 with metoprolol  - cont Eliquis  for clot prevention  - TSH 3.64 05/2023 - apixaban  (ELIQUIS ) 2.5 MG TABS tablet; Take 1 tablet (2.5 mg total) by mouth 2 (two) times daily.  Dispense: 180 tablet; Refill: 1 - metoprolol  succinate (TOPROL -XL) 100 MG 24 hr tablet; Take 1 tablet (100 mg total) by mouth daily. Take with or immediately following a meal.  Dispense: 90 tablet; Refill: 1 - TSH  2. Other iron deficiency anemia (Primary) - hgb 10.7, iron 169, TIBC 323, ferritin 50 12/31/2023 - tolerating Niferex well  - Ferritin - Iron and  TIBC - Reticulocytes  3. Pacemaker - h/o  SSS  4. Sick sinus syndrome (HCC)   5. Essential hypertension, benign - controlled with metoprolol  and HCTZ -BUN/creat 29/1.77 06/04/2023 - hydrochlorothiazide  (HYDRODIURIL ) 25 MG tablet; Take 1 tablet (25 mg total) by mouth daily.  Dispense: 90 tablet; Refill: 1 - CBC with Differential/Platelet - Complete Metabolic Panel with eGFR  6. Hyperlipidemia, unspecified hyperlipidemia type - LDL 64 06/04/2023 - cont atorvastatin  - atorvastatin  (LIPITOR) 10 MG tablet; Take 1 tablet (10 mg total) by mouth daily.  Dispense: 90 tablet; Refill: 1 - Lipid Panel  7. Prediabetes - A1c 6.7 06/2023, goal < 7 - diet controlled - no hypoglycemias - cont diet low in carbs and sugars - Hemoglobin A1c    Family/ staff Communication: plan discussed with patient   Labs/tests ordered:  routine labs 05/02/2024

## 2024-03-04 NOTE — Patient Instructions (Signed)
 TL fasting labs in clinic 12/29 @ 7:30 am

## 2024-03-29 ENCOUNTER — Ambulatory Visit: Payer: PPO

## 2024-03-29 DIAGNOSIS — I495 Sick sinus syndrome: Secondary | ICD-10-CM | POA: Diagnosis not present

## 2024-03-30 LAB — CUP PACEART REMOTE DEVICE CHECK
Battery Voltage: 30
Date Time Interrogation Session: 20251125093001
Implantable Lead Connection Status: 753985
Implantable Lead Connection Status: 753985
Implantable Lead Implant Date: 20060801
Implantable Lead Implant Date: 20060801
Implantable Lead Location: 753859
Implantable Lead Location: 753860
Implantable Lead Model: 350
Implantable Lead Serial Number: 24319010
Implantable Lead Serial Number: 24332742
Implantable Pulse Generator Implant Date: 20141229
Pulse Gen Serial Number: 68145068

## 2024-04-04 ENCOUNTER — Ambulatory Visit: Payer: Self-pay | Admitting: Cardiology

## 2024-04-04 NOTE — Progress Notes (Signed)
 Remote PPM Transmission

## 2024-05-03 ENCOUNTER — Ambulatory Visit: Payer: Self-pay | Admitting: Orthopedic Surgery

## 2024-05-03 LAB — LIPID PANEL
Cholesterol: 140 mg/dL
HDL: 37 mg/dL — ABNORMAL LOW
LDL Cholesterol (Calc): 70 mg/dL
Non-HDL Cholesterol (Calc): 103 mg/dL
Total CHOL/HDL Ratio: 3.8 (calc)
Triglycerides: 252 mg/dL — ABNORMAL HIGH

## 2024-05-03 LAB — IRON,TIBC AND FERRITIN PANEL
%SAT: 41 % (ref 20–48)
Ferritin: 57 ng/mL (ref 24–380)
Iron: 129 ug/dL (ref 50–180)
TIBC: 313 ug/dL (ref 250–425)

## 2024-05-03 LAB — CBC WITH DIFFERENTIAL/PLATELET
Absolute Lymphocytes: 1377 {cells}/uL (ref 850–3900)
Absolute Monocytes: 1581 {cells}/uL — ABNORMAL HIGH (ref 200–950)
Basophils Absolute: 51 {cells}/uL (ref 0–200)
Basophils Relative: 1 %
Eosinophils Absolute: 0 {cells}/uL — ABNORMAL LOW (ref 15–500)
Eosinophils Relative: 0 %
HCT: 30.5 % — ABNORMAL LOW (ref 39.4–51.1)
Hemoglobin: 10.4 g/dL — ABNORMAL LOW (ref 13.2–17.1)
LUCs, %: 1 %
MCH: 34.9 pg — ABNORMAL HIGH (ref 27.0–33.0)
MCHC: 34.1 g/dL (ref 31.6–35.4)
MCV: 102.3 fL — ABNORMAL HIGH (ref 81.4–101.7)
MPV: 11.5 fL (ref 7.5–12.5)
Monocytes Relative: 31 %
Neutro Abs: 2091 {cells}/uL (ref 1500–7800)
Neutrophils Relative %: 41 %
Platelets: 185 Thousand/uL (ref 140–400)
RBC: 2.98 Million/uL — ABNORMAL LOW (ref 4.20–5.80)
RDW: 12.1 % (ref 11.0–15.0)
Total Lymphocyte: 26 %
WBC: 5.1 Thousand/uL (ref 3.8–10.8)

## 2024-05-03 LAB — COMPLETE METABOLIC PANEL WITHOUT GFR
AG Ratio: 1.8 (calc) (ref 1.0–2.5)
ALT: 17 U/L (ref 9–46)
AST: 15 U/L (ref 10–35)
Albumin: 4.5 g/dL (ref 3.6–5.1)
Alkaline phosphatase (APISO): 88 U/L (ref 35–144)
BUN/Creatinine Ratio: 14 (calc) (ref 6–22)
BUN: 25 mg/dL (ref 7–25)
CO2: 25 mmol/L (ref 20–32)
Calcium: 8.9 mg/dL (ref 8.6–10.3)
Chloride: 99 mmol/L (ref 98–110)
Creat: 1.78 mg/dL — ABNORMAL HIGH (ref 0.70–1.22)
Globulin: 2.5 g/dL (ref 1.9–3.7)
Glucose, Bld: 126 mg/dL — ABNORMAL HIGH (ref 65–99)
Potassium: 4 mmol/L (ref 3.5–5.3)
Sodium: 136 mmol/L (ref 135–146)
Total Bilirubin: 0.7 mg/dL (ref 0.2–1.2)
Total Protein: 7 g/dL (ref 6.1–8.1)

## 2024-05-03 LAB — HEMOGLOBIN A1C
Hgb A1c MFr Bld: 6.7 % — ABNORMAL HIGH
Mean Plasma Glucose: 146 mg/dL
eAG (mmol/L): 8.1 mmol/L

## 2024-05-03 LAB — TSH: TSH: 4.06 m[IU]/L (ref 0.40–4.50)

## 2024-05-03 LAB — RETICULOCYTES
ABS Retic: 60200 {cells}/uL (ref 25000–90000)
Retic Ct Pct: 2 %

## 2024-05-12 ENCOUNTER — Inpatient Hospital Stay: Attending: Oncology | Admitting: Oncology

## 2024-05-12 ENCOUNTER — Encounter: Payer: Self-pay | Admitting: Oncology

## 2024-05-12 VITALS — BP 150/80 | HR 65 | Temp 98.1°F | Resp 20 | Wt 167.5 lb

## 2024-05-12 DIAGNOSIS — D631 Anemia in chronic kidney disease: Secondary | ICD-10-CM

## 2024-05-12 DIAGNOSIS — N1832 Chronic kidney disease, stage 3b: Secondary | ICD-10-CM | POA: Diagnosis not present

## 2024-05-12 NOTE — Progress Notes (Signed)
 " Hematology/Oncology Progress note [phone] [phone]           REFERRING PROVIDER: Abdul Fine, MD   CHIEF COMPLAINTS/REASON FOR VISIT:  Follow-up for anemia due to chronic kidney disease.   ASSESSMENT & PLAN:   Anemia in chronic kidney disease (CKD) Previous labs were reviewed and discussed with patient. Anemia is likely secondary to chronic kidney disease. Lab Results  Component Value Date   HGB 10.4 (L) 05/02/2024   TIBC 313 05/02/2024   IRONPCTSAT 41 05/02/2024   FERRITIN 57 05/02/2024    Hemoglobin is above 10. goal ferritin 200. I discussed about option of continue oral iron supplementation and repeat blood work for evaluation of treatment response.  If hemoglobin decreases below 10 despite oral iron supplementation, then proceed with IV Venofer treatments. Alternative option of proceed with IV Venofer treatments. I discussed about the potential risks including but not limited to allergic reactions/infusion reactions including anaphylactic reactions, diarrhea, phlebitis, high blood pressure, wheezing, SOB, skin rash, weight gain,dark urine, leg swelling, back pain, headache, nausea and fatigue, etc. Shared decision was made to continue oral iron.  .   continue oral iron supplementation Vitron-C 1 tablet daily    Stage 3b chronic kidney disease (HCC) Patient has had SPEP done which showed no M protein. Encourage oral hydration and avoid nephrotoxins.    Patient prefers to get blood work done at his PCP's office. Check cbc iron tibc ferritin  No orders of the defined types were placed in this encounter.  Follow up in 4 months All questions were answered. The patient knows to call the clinic with any problems, questions or concerns.  Zelphia Cap, MD, PhD [facility] Hematology Oncology 05/12/2024   HISTORY OF PRESENTING ILLNESS:   Jason Lambert is a  89 y.o.  male with PMH listed below was seen in consultation at the request of   Abdul Fine, MD  for evaluation of anemia.   Patient has chronic history of anemia since at least 2020.  His baseline hemoglobin level has progressively decreased over the past few years and currently at 10.6 on 09/10/2023.  He has chronic kidney disease, His previous workup showed normal iron, B12, and folate levels, although B12 was borderline. He also has elevated ANA levels and has been referred to a rheumatologist for further evaluation.  06/04/2023, iron saturation 41, ferritin 34.  Patient reports feeling tired sometimes.  No chest pain, shortness of breath.  Patient denies any blood in the stool or dark tarry stool.  No bowel habit changes.   INTERVAL HISTORY Jason Lambert is a 89 y.o. male who has above history reviewed by me today presents for follow up visit for anemia due to chronic kidney disease Patient takes vitamin C 1 tablet daily.  Tolerated well.  No new complaints.   MEDICAL HISTORY:  Past Medical History:  Diagnosis Date   Adenocarcinoma [facility])    Prostate   Allergic rhinitis    Atrial fibrillation (HCC)    BCC (basal cell carcinoma of skin) 06/15/2023   R lower leg Mohs 07/14/23   BCC (basal cell carcinoma of skin) 06/23/2023   mid back, Mohs 07/14/2023   Grave's disease    Hyperlipidemia    Hypertension    Prostate cancer (HCC)    Renal insufficiency    sinus node dysfunction    s/p BTK pacemaker    SURGICAL HISTORY: Past Surgical History:  Procedure Laterality Date   CATARACT EXTRACTION W/ INTRAOCULAR LENS  IMPLANT,  BILATERAL  2012   PACEMAKER GENERATOR CHANGE N/A 05/02/2013   Procedure: PACEMAKER GENERATOR CHANGE;  Surgeon: Elspeth JAYSON Sage, MD;  Location: [facility] CATH LAB;  Service: Cardiovascular;  Laterality: N/A;   PACEMAKER INSERTION  8/06; 05/02/2013   Bradycardia/ syncope; BTK with generator change 05/02/2013 by Dr Sage   Prostate Cancer  2007   RT   TONSILLECTOMY  1947   adenoids    SOCIAL HISTORY: Social History   Socioeconomic History    Marital status: Married    Spouse name: Not on file   Number of children: 2   Years of education: Not on file   Highest education level: Bachelor's degree (e.g., BA, AB, BS)  Occupational History   Occupation: Retired    Comment: Acupuncturist with [employer]  Tobacco Use   Smoking status: Never    Passive exposure: Never   Smokeless tobacco: Never  Vaping Use   Vaping status: Never Used  Substance and Sexual Activity   Alcohol use: No    Comment: Occasional   Drug use: No   Sexual activity: Not on file  Other Topics Concern   Not on file  Social History Narrative   Has a living will.    Wife to make health care decisions (then daughter [name])--formal health care POA   Would accept resuscitation   Not sure about feeding tube but no unusual attempts   Donating body to [employer]      Social Drivers of Health   Tobacco Use: Low Risk (05/12/2024)   Patient History    Smoking Tobacco Use: Never    Smokeless Tobacco Use: Never    Passive Exposure: Never  Financial Resource Strain: Low Risk  (12/28/2023)   Received from [facility]   Overall Financial Resource Strain (CARDIA)    Difficulty of Paying Living Expenses: Not very hard  Food Insecurity: No Food Insecurity (12/28/2023)   Received from [facility]   Epic    Within the past 12 months, you worried that your food would run out before you got the money to buy more.: Never true    Within the past 12 months, the food you bought just didn't last and you didn't have money to get more.: Never true  Transportation Needs: No Transportation Needs (12/28/2023)   Received from [facility] - Transportation    In the past 12 months, has lack of transportation kept you from medical appointments or from getting medications?: No    Lack of Transportation (Non-Medical): No  Physical Activity: Sufficiently Active (05/30/2023)   Exercise Vital Sign    Days of Exercise  per Week: 5 days    Minutes of Exercise per Session: 30 min  Stress: No Stress Concern Present (05/30/2023)   [employer] of Occupational Health - Occupational Stress Questionnaire    Feeling of Stress : Not at all  Social Connections: Moderately Integrated (05/30/2023)   Social Connection and Isolation Panel    Frequency of Communication with Friends and Family: Once a week    Frequency of Social Gatherings with Friends and Family: Once a week    Attends Religious Services: 1 to 4 times per year    Active Member of [employer] or Organizations: Yes    Attends Banker Meetings: More than 4 times per year    Marital Status: Married  Catering Manager Violence: Not At Risk (12/02/2023)   Epic    Fear of Current or Ex-Partner:  No    Emotionally Abused: No    Physically Abused: No    Sexually Abused: No  Depression (PHQ2-9): Low Risk (03/04/2024)   Depression (PHQ2-9)    PHQ-2 Score: 0  Alcohol Screen: Low Risk (05/30/2023)   Alcohol Screen    Last Alcohol Screening Score (AUDIT): 0  Housing: Low Risk  (12/28/2023)   Received from [facility]   Epic    In the last 12 months, was there a time when you were not able to pay the mortgage or rent on time?: No    In the past 12 months, how many times have you moved where you were living?: 0    At any time in the past 12 months, were you homeless or living in a shelter (including now)?: No  Utilities: Not At Risk (12/28/2023)   Received from [facility]   Epic    In the past 12 months has the electric, gas, oil, or water company threatened to shut off services in your home?: No  Health Literacy: Adequate Health Literacy (09/30/2023)   B1300 Health Literacy    Frequency of need for help with medical instructions: Never    FAMILY HISTORY: Family History  Problem Relation Age of Onset   Cancer Mother    Diabetes Mother    Coronary artery disease Father        cabg   Hypertension Neg Hx     Prostate cancer Neg Hx    Colon cancer Neg Hx     ALLERGIES:  has no known allergies.  MEDICATIONS:  Current Outpatient Medications  Medication Sig Dispense Refill   acetaminophen  (TYLENOL ) 325 MG tablet Take 650 mg by mouth at bedtime.     apixaban  (ELIQUIS ) 2.5 MG TABS tablet Take 1 tablet (2.5 mg total) by mouth 2 (two) times daily. 180 tablet 1   atorvastatin  (LIPITOR) 10 MG tablet Take 1 tablet (10 mg total) by mouth daily. 90 tablet 1   cholecalciferol (VITAMIN D3) 25 MCG (1000 UNIT) tablet Take 1,000 Units by mouth daily. Per patient taking 800 units daily     hydrochlorothiazide  (HYDRODIURIL ) 25 MG tablet Take 1 tablet (25 mg total) by mouth daily. 90 tablet 1   iron polysaccharides (NIFEREX) 150 MG capsule Take 150 mg by mouth daily.     loratadine (CLARITIN) 10 MG tablet Take 10 mg by mouth daily as needed.     metoprolol  succinate (TOPROL -XL) 100 MG 24 hr tablet Take 1 tablet (100 mg total) by mouth daily. Take with or immediately following a meal. 90 tablet 1   Multiple Vitamins-Minerals (CENTRUM SILVER PO) Take 1 tablet by mouth daily.     triamcinolone  cream (KENALOG ) 0.1 % Apply 1 Application topically 2 (two) times daily. 30 g 0   No current facility-administered medications for this visit.    Review of Systems  Constitutional:  Positive for fatigue. Negative for appetite change, chills, fever and unexpected weight change.  HENT:   Negative for hearing loss and voice change.   Eyes:  Negative for eye problems and icterus.  Respiratory:  Negative for chest tightness, cough and shortness of breath.   Cardiovascular:  Negative for chest pain and leg swelling.  Gastrointestinal:  Negative for abdominal distention and abdominal pain.  Endocrine: Negative for hot flashes.  Genitourinary:  Negative for difficulty urinating, dysuria and frequency.   Musculoskeletal:  Negative for arthralgias.  Skin:  Negative for itching and rash.  Neurological:  Negative for  light-headedness and numbness.  Hematological:  Negative for adenopathy. Does not bruise/bleed easily.  Psychiatric/Behavioral:  Negative for confusion.    PHYSICAL EXAMINATION:  Vitals:   05/12/24 1010  BP: (!) 150/80  Pulse: 65  Resp: 20  Temp: 98.1 F (36.7 C)  SpO2: 100%   Filed Weights   05/12/24 1010  Weight: 167 lb 8 oz (76 kg)    Physical Exam Constitutional:      General: He is not in acute distress. HENT:     Head: Normocephalic and atraumatic.  Eyes:     General: No scleral icterus. Cardiovascular:     Rate and Rhythm: Normal rate and regular rhythm.  Pulmonary:     Effort: Pulmonary effort is normal. No respiratory distress.     Breath sounds: No wheezing.  Abdominal:     General: Bowel sounds are normal. There is no distension.     Palpations: Abdomen is soft.  Musculoskeletal:        General: No deformity. Normal range of motion.     Cervical back: Normal range of motion and neck supple.  Skin:    General: Skin is warm and dry.     Findings: No erythema or rash.  Neurological:     Mental Status: He is alert and oriented to person, place, and time. Mental status is at baseline.  Psychiatric:        Mood and Affect: Mood normal.     LABORATORY DATA:  I have reviewed the data as listed    Latest Ref Rng & Units 05/02/2024    7:42 AM 12/31/2023    8:10 AM 09/10/2023    7:30 AM  CBC  WBC 3.8 - 10.8 Thousand/uL 5.1  4.9  5.9   Hemoglobin 13.2 - 17.1 g/dL 89.5  89.2  89.3   Hematocrit 39.4 - 51.1 % 30.5  30.8  30.9   Platelets 140 - 400 Thousand/uL 185  234  195       Latest Ref Rng & Units 05/02/2024    7:42 AM 09/10/2023    7:30 AM 06/04/2023    7:47 AM  CMP  Glucose 65 - 99 mg/dL 873   858   BUN 7 - 25 mg/dL 25   29   Creatinine 9.29 - 1.22 mg/dL 8.21   8.22   Sodium 864 - 146 mmol/L 136   136   Potassium 3.5 - 5.3 mmol/L 4.0   3.4   Chloride 98 - 110 mmol/L 99   96   CO2 20 - 32 mmol/L 25   30   Calcium  8.6 - 10.3 mg/dL 8.9   8.9    8.9    Total Protein 6.1 - 8.1 g/dL 7.0  6.8  6.9   Total Bilirubin 0.2 - 1.2 mg/dL 0.7   0.7   AST 10 - 35 U/L 15   14   ALT 9 - 46 U/L 17   14       RADIOGRAPHIC STUDIES: I have personally reviewed the radiological images as listed and agreed with the findings in the report. CUP PACEART REMOTE DEVICE CHECK Result Date: 03/30/2024 PPM Scheduled remote reviewed. Normal device function.  Presenting rhythm:  AP/VS Next remote transmission per protocol. LA, CVRS        "

## 2024-05-12 NOTE — Assessment & Plan Note (Signed)
 Patient has had SPEP done which showed no M protein. Encourage oral hydration and avoid nephrotoxins.

## 2024-05-12 NOTE — Assessment & Plan Note (Addendum)
 Previous labs were reviewed and discussed with patient. Anemia is likely secondary to chronic kidney disease. Lab Results  Component Value Date   HGB 10.4 (L) 05/02/2024   TIBC 313 05/02/2024   IRONPCTSAT 41 05/02/2024   FERRITIN 57 05/02/2024    Hemoglobin is above 10. goal ferritin 200. I discussed about option of continue oral iron supplementation and repeat blood work for evaluation of treatment response.  If hemoglobin decreases below 10 despite oral iron supplementation, then proceed with IV Venofer treatments. Alternative option of proceed with IV Venofer treatments. I discussed about the potential risks including but not limited to allergic reactions/infusion reactions including anaphylactic reactions, diarrhea, phlebitis, high blood pressure, wheezing, SOB, skin rash, weight gain,dark urine, leg swelling, back pain, headache, nausea and fatigue, etc. Shared decision was made to continue oral iron.  .   continue oral iron supplementation Vitron-C 1 tablet daily

## 2024-05-27 ENCOUNTER — Other Ambulatory Visit (HOSPITAL_COMMUNITY): Payer: Self-pay

## 2024-05-28 ENCOUNTER — Other Ambulatory Visit (HOSPITAL_COMMUNITY): Payer: Self-pay

## 2024-05-29 ENCOUNTER — Other Ambulatory Visit (HOSPITAL_COMMUNITY): Payer: Self-pay

## 2024-05-30 ENCOUNTER — Other Ambulatory Visit: Payer: Self-pay

## 2024-05-30 ENCOUNTER — Other Ambulatory Visit (HOSPITAL_COMMUNITY): Payer: Self-pay

## 2024-05-31 ENCOUNTER — Other Ambulatory Visit: Payer: Self-pay

## 2024-06-09 NOTE — Progress Notes (Unsigned)
 "     Electrophysiology Clinic Note    Date:  06/10/2024  Patient ID:  Jason Lambert, Jason Lambert 1934/10/20, MRN [id number] PCP:  Gil Greig BRAVO, NP  Cardiologist:  None  Electrophysiologist:  Fonda Kitty, MD  Electrophysiology APP:  Donta Mcinroy, NP    Discussed the use of AI scribe software for clinical note transcription with the patient, who gave verbal consent to proceed.   Patient Profile    Chief Complaint: device follow-up  History of Present Illness: Jason Lambert is a 89 y.o. male with PMH notable for SND s/p PPM, parox AFib, HTN, HLD, CKD 3b, IDA anemia; seen today for Fonda Kitty, MD (Previously Dr. Fernande) for routine electrophysiology followup.   He last saw Dr. Fernande 04/2023 at which time he was overall doing well, having intermittent lower extremity edema, no meds changed. Previously had significantly reduced exercise tolerance, since improved with device's CLS alogorithm.  On follow-up today, he is continues to do well without cardiac complaints. His only complaint is persistently cold feet. He questions whether this is because of his eliquis .  He denies chest pain, chest pressure, palpitations. No bleeding concerns on eliquis . He sees hem onc for his anemia, has been taking iron supplements without improvement in hgb.   Arrhythmia/Device History Biotronik dual chamber PPM, imp 2006; SND - gen change 2014    ROS:  Please see the history of present illness. All other systems are reviewed and otherwise negative.    Physical Exam    VS:  BP 126/82 (BP Location: Right Arm, Patient Position: Sitting, Cuff Size: Normal)   Pulse 81   Ht 5' 3 (1.6 m)   Wt 165 lb (74.8 kg)   SpO2 97%   BMI 29.23 kg/m  BMI: Body mass index is 29.23 kg/m.           Wt Readings from Last 3 Encounters:  06/10/24 165 lb (74.8 kg)  05/12/24 167 lb 8 oz (76 kg)  03/04/24 167 lb 12.8 oz (76.1 kg)      GEN- The patient is well appearing, alert and oriented x 3 today.   Lungs-  Clear to ausculation bilaterally, normal work of breathing.  Heart- Regular rate and rhythm, no murmurs, rubs or gallops Extremities- No peripheral edema, warm, dry Skin-  device pocket well-healed, no tethering    Device interrogation done today and reviewed by myself:  Battery 2 years Lead thresholds, impedence, sensing stable  Intrinsic rhythm mid-40s HR histograms blunted at LRL No episodes No changes made today   Studies Reviewed   Previous EP, cardiology notes.    EKG is ordered. Personal review of EKG from today shows:    EKG Interpretation Date/Time:  Friday June 10 2024 10:08:57 EST Ventricular Rate:  81 PR Interval:  248 QRS Duration:  130 QT Interval:  398 QTC Calculation: 462 R Axis:   21  Text Interpretation: Atrial-paced rhythm with prolonged AV conduction Right bundle branch block Confirmed by Vera Furniss [id number]) on 06/10/2024 9:34:00 PM     Myocardial Spect, 09/18/2022 Pharmacological myocardial perfusion imaging study with no significant  ischemia Normal wall motion, EF estimated at 97% No EKG changes concerning for ischemia at peak stress or in recovery. CT attenuation correction images with pacing wires, moderate aortic atherosclerosis and coronary calcification Low risk scan  TTE, 09/09/2022  1. Left ventricular ejection fraction, by estimation, is 60 to 65%. The left ventricle has normal function. The left ventricle has no regional wall motion abnormalities. Left  ventricular diastolic parameters are consistent with Grade I diastolic dysfunction (impaired relaxation).   2. Right ventricular systolic function is normal. The right ventricular size is normal. There is normal pulmonary artery systolic pressure. The estimated right ventricular systolic pressure is 25.1 mmHg.   3. The mitral valve is normal in structure. No evidence of mitral valve  regurgitation. No evidence of mitral stenosis.   4. The aortic valve is normal in structure. Aortic valve  regurgitation is mild. No aortic stenosis is present.   5. There is borderline dilatation of the aortic root, measuring 37 mm. There is borderline dilatation of the ascending aorta, measuring 38 mm.   6. The inferior vena cava is normal in size with greater than 50% respiratory variability, suggesting right atrial pressure of 3 mmHg.    Assessment and Plan     #) SND s/p PPM Device functioning well, see paceart for details Battery good Lead measurements stable Low VP, high AP Histograms blunted. Discussed with patient whether to adjust CLS or rate response and he overall feels well and requested no device adjustments   #) parox AFib, SCAF #) Hypercoag d/t parox afib #) anemia No recent AF via device CHA2DS2-VASc Score = at least 4 (HTN, T2DM, age x 2) We briefly discussed stopping eliquis  given lack of AFib with PPM monitoring strategy in place Patient prefers to continue eliquis  at this time, will discuss further with oncologist at next appt Continue 2.5mg  eliquis  BID      Current medicines are reviewed at length with the patient today.   The patient does not have concerns regarding his medicines.  The following changes were made today:  none  Labs/ tests ordered today include:  Orders Placed This Encounter  Procedures   CUP PACEART INCLINIC DEVICE CHECK   EKG 12-Lead     Disposition: Follow up with Dr. Kennyth or EP APP in 12 months   Signed, Chantal Needle, NP  06/10/24  9:34 PM  Electrophysiology [facility] "

## 2024-06-10 ENCOUNTER — Encounter: Payer: Self-pay | Admitting: Cardiology

## 2024-06-10 ENCOUNTER — Ambulatory Visit: Admitting: Cardiology

## 2024-06-10 VITALS — BP 126/82 | HR 81 | Ht 63.0 in | Wt 165.0 lb

## 2024-06-10 DIAGNOSIS — I480 Paroxysmal atrial fibrillation: Secondary | ICD-10-CM

## 2024-06-10 DIAGNOSIS — I495 Sick sinus syndrome: Secondary | ICD-10-CM

## 2024-06-10 DIAGNOSIS — Z950 Presence of cardiac pacemaker: Secondary | ICD-10-CM

## 2024-06-10 LAB — CUP PACEART INCLINIC DEVICE CHECK
Battery Remaining Percentage: 30 %
Brady Statistic RA Percent Paced: 96 %
Brady Statistic RV Percent Paced: 5 %
Date Time Interrogation Session: 20260206103224
Implantable Lead Connection Status: 753985
Implantable Lead Connection Status: 753985
Implantable Lead Implant Date: 20060801
Implantable Lead Implant Date: 20060801
Implantable Lead Location: 753859
Implantable Lead Location: 753860
Implantable Lead Model: 350
Implantable Lead Serial Number: 24319010
Implantable Lead Serial Number: 24332742
Implantable Pulse Generator Implant Date: 20141229
Lead Channel Impedance Value: 409 Ohm
Lead Channel Impedance Value: 682 Ohm
Lead Channel Pacing Threshold Amplitude: 0.3 V
Lead Channel Pacing Threshold Amplitude: 0.8 V
Lead Channel Pacing Threshold Pulse Width: 0.4 ms
Lead Channel Pacing Threshold Pulse Width: 0.4 ms
Lead Channel Setting Pacing Amplitude: 0.8 V
Lead Channel Setting Pacing Amplitude: 1.9 V
Lead Channel Setting Pacing Pulse Width: 0.4 ms
Pulse Gen Serial Number: 68145068

## 2024-06-10 NOTE — Patient Instructions (Signed)
 Medication Instructions:  Your physician recommends that you continue on your current medications as directed. Please refer to the Current Medication list given to you today.  *If you need a refill on your cardiac medications before your next appointment, please call your pharmacy*  Lab Work: No labs ordered today    Testing/Procedures: No test ordered today   Follow-Up: At [facility], you and your health needs are our priority.  As part of our continuing mission to provide you with exceptional heart care, our providers are all part of one team.  This team includes your primary Cardiologist (physician) and Advanced Practice Providers or APPs (Physician Assistants and Nurse Practitioners) who all work together to provide you with the care you need, when you need it.  Your next appointment:   1 year(s)  Provider:   Suzann Riddle, NP

## 2024-06-14 ENCOUNTER — Ambulatory Visit: Admitting: Cardiology

## 2024-06-28 ENCOUNTER — Ambulatory Visit: Payer: PPO

## 2024-07-07 ENCOUNTER — Ambulatory Visit: Admitting: Dermatology

## 2024-08-31 ENCOUNTER — Encounter: Admitting: Orthopedic Surgery

## 2024-09-08 ENCOUNTER — Inpatient Hospital Stay: Admitting: Oncology

## 2024-09-27 ENCOUNTER — Ambulatory Visit

## 2024-12-27 ENCOUNTER — Ambulatory Visit

## 2025-03-28 ENCOUNTER — Ambulatory Visit

## 2025-06-27 ENCOUNTER — Ambulatory Visit

## 2025-09-26 ENCOUNTER — Ambulatory Visit

## 2025-12-26 ENCOUNTER — Ambulatory Visit
